# Patient Record
Sex: Female | Born: 1935 | Race: White | Hispanic: No | Smoking: Never smoker
Health system: Southern US, Community
[De-identification: ages and names within clinical notes are randomized; demographics above are authoritative.]

## PROBLEM LIST (undated history)

## (undated) DIAGNOSIS — I429 Cardiomyopathy, unspecified: Secondary | ICD-10-CM

## (undated) DIAGNOSIS — N183 Chronic kidney disease, stage 3 unspecified: Secondary | ICD-10-CM

## (undated) DIAGNOSIS — K449 Diaphragmatic hernia without obstruction or gangrene: Secondary | ICD-10-CM

## (undated) DIAGNOSIS — I77810 Thoracic aortic ectasia: Secondary | ICD-10-CM

## (undated) DIAGNOSIS — I509 Heart failure, unspecified: Secondary | ICD-10-CM

## (undated) DIAGNOSIS — J449 Chronic obstructive pulmonary disease, unspecified: Secondary | ICD-10-CM

## (undated) DIAGNOSIS — J9611 Chronic respiratory failure with hypoxia: Secondary | ICD-10-CM

## (undated) DIAGNOSIS — J45909 Unspecified asthma, uncomplicated: Secondary | ICD-10-CM

## (undated) DIAGNOSIS — M109 Gout, unspecified: Secondary | ICD-10-CM

## (undated) DIAGNOSIS — I4891 Unspecified atrial fibrillation: Secondary | ICD-10-CM

## (undated) DIAGNOSIS — Z9981 Dependence on supplemental oxygen: Secondary | ICD-10-CM

## (undated) DIAGNOSIS — E43 Unspecified severe protein-calorie malnutrition: Secondary | ICD-10-CM

## (undated) DIAGNOSIS — I10 Essential (primary) hypertension: Secondary | ICD-10-CM

## (undated) DIAGNOSIS — E039 Hypothyroidism, unspecified: Secondary | ICD-10-CM

## (undated) HISTORY — DX: Cardiomyopathy, unspecified: I42.9

## (undated) HISTORY — PX: ROTATOR CUFF REPAIR: SHX139

## (undated) HISTORY — DX: Unspecified asthma, uncomplicated: J45.909

## (undated) HISTORY — DX: Gout, unspecified: M10.9

## (undated) HISTORY — PX: ABDOMINAL HYSTERECTOMY: SUR658

---

## 1997-11-05 ENCOUNTER — Other Ambulatory Visit: Admission: RE | Admit: 1997-11-05 | Discharge: 1997-11-05 | Payer: Self-pay | Admitting: Obstetrics and Gynecology

## 1998-12-15 ENCOUNTER — Other Ambulatory Visit: Admission: RE | Admit: 1998-12-15 | Discharge: 1998-12-15 | Payer: Self-pay | Admitting: Obstetrics and Gynecology

## 1999-12-29 ENCOUNTER — Other Ambulatory Visit: Admission: RE | Admit: 1999-12-29 | Discharge: 1999-12-29 | Payer: Self-pay | Admitting: Obstetrics and Gynecology

## 2002-03-26 ENCOUNTER — Other Ambulatory Visit: Admission: RE | Admit: 2002-03-26 | Discharge: 2002-03-26 | Payer: Self-pay | Admitting: Obstetrics and Gynecology

## 2002-12-19 ENCOUNTER — Encounter: Payer: Self-pay | Admitting: Gastroenterology

## 2002-12-19 ENCOUNTER — Ambulatory Visit (HOSPITAL_COMMUNITY): Admission: RE | Admit: 2002-12-19 | Discharge: 2002-12-19 | Payer: Self-pay | Admitting: Gastroenterology

## 2004-01-21 ENCOUNTER — Encounter
Admission: RE | Admit: 2004-01-21 | Discharge: 2004-04-20 | Payer: Self-pay | Admitting: Physical Medicine and Rehabilitation

## 2004-05-04 ENCOUNTER — Encounter: Admission: RE | Admit: 2004-05-04 | Discharge: 2004-05-25 | Payer: Self-pay | Admitting: Family Medicine

## 2007-06-05 ENCOUNTER — Encounter: Admission: RE | Admit: 2007-06-05 | Discharge: 2007-06-05 | Payer: Self-pay | Admitting: Orthopedic Surgery

## 2007-06-06 ENCOUNTER — Ambulatory Visit (HOSPITAL_BASED_OUTPATIENT_CLINIC_OR_DEPARTMENT_OTHER): Admission: RE | Admit: 2007-06-06 | Discharge: 2007-06-07 | Payer: Self-pay | Admitting: Orthopedic Surgery

## 2007-12-30 ENCOUNTER — Encounter: Admission: RE | Admit: 2007-12-30 | Discharge: 2007-12-30 | Payer: Self-pay | Admitting: Family Medicine

## 2010-02-02 ENCOUNTER — Encounter: Admission: RE | Admit: 2010-02-02 | Discharge: 2010-02-02 | Payer: Self-pay | Admitting: Family Medicine

## 2010-09-01 NOTE — Op Note (Signed)
NAMEMENA, SIMONIS NO.:  [id number]   MEDICAL RECORD NO.:  [id number]          PATIENT TYPE:  AMB   LOCATION:  DSC                          FACILITY:  [facility]   PHYSICIAN:  Leonides Grills, M.D.     DATE OF BIRTH:  [birth year]   DATE OF PROCEDURE:  06/06/2007  DATE OF DISCHARGE:                               OPERATIVE REPORT   PREOPERATIVE DIAGNOSES:  1. Right hallux valgus.  2. Right benign deep soft tissue lesion, great toe.   POSTOPERATIVE DIAGNOSES:  1. Right hallux valgus.  2. Right benign deep soft tissue lesion, great toe.   PROCEDURE:  1. Right modified McBride bunionectomy.  2. Excision of benign deep soft tissue lesion foot.  3. Right great toe digital nerve neurolysis.   ANESTHESIA:  General with block.   SURGEON:  Leonides Grills, M.D.   ASSISTANT:  Richardean Canal, P.A.   ESTIMATED BLOOD LOSS:  Minimal.   TOURNIQUET TIME:  Approximately a 1/2 hour.   COMPLICATIONS:  None.   DISPOSITION:  Stable to PR.   INDICATIONS:  This is an 77 year old female who has had a chronic wound  over the medial aspect of her right great toe due to a large tophaceous  gouty lesion that was about 1/2 of a golf ball size  with a advanced  hallux valgus deformity.  She was consented for the above procedure.  All risks which included infection or vessel injury, persistent pain,  worsening pain, prolonged recovery, stiffness, arthritis, recurrence of  the tophaceous lesion and wound breakdown were all explained and  questions were encouraged and answered.   OPERATION:  The patient was brought to the operating room and placed in  supine position.  After adequate general endotracheal tube anesthesia  was administered, as well as Ancef 1 gram IV piggyback the right lower  extremity was then prepped and draped in sterile manner over proximally  thigh tourniquet.  The limb was gravity exsanguinated and tourniquet was  elevated to 290 mmHg.  A elliptical incision was made  around the small  wound.  This was excised the digital nerve dorsally was completely  dissected out a formal neurolysis was performed and neurorrhaphy.  Once  this retracted out of harm's way we were able to completely and  carefully dissect out the tophaceous lesion.  This was about a 1/2 of a  golf ball size and extended all way up to the EHL tendon dorsally and  down to the plantar third of the medial capsule and advanced to about  the proximal third of the proximal phalanx.  This enveloped the entire  medial capsule and made this quite insufficient.  This was carefully  dissected out.  Once this was dissected out the prominence of the medial  aspect of the great toe metatarsal head was then removed with a rongeur  and Alona Bene  ridge was rounded off as well.  Once this portion of  the modified McBride bunionectomy was performed we were able then to  release the lateral capsule with a curved Beaver blade protecting the  articular surface with a Engineer, site.  This released  the lateral capsule  nicely and we were able to reduce the sesamoids and reduce the joint.  We then dissected out what was left of the medial capsule and pieced  this together and I was able to reconstruct the medial capsule.  This  was not obviously as good as it would be if it was a native capsule, but  it was the best we could do with what we had left.  Once this was done  we were able to range the toe and the toe ranged beautifully and the  sesamoids were located.  Once again the joint areas were copiously  irrigated with normal saline prior to the reconstruction and after  reconstruction as well.  The tourniquet was deflated.  Hemostasis was  obtained.  Subcu was closed with 3-0 Vicryl and skin was closed with 4-0  nylon.  Sterile dressing applied.  A Roger Mann dressing was applied.  Hard sole shoe applied.  Patient stable to PR.      Leonides Grills, M.D.  Electronically Signed     PB/MEDQ  D:  06/06/2007  T:   06/07/2007  Job:  [id number]

## 2011-01-08 LAB — BASIC METABOLIC PANEL
BUN: 44 — ABNORMAL HIGH
Chloride: 104
GFR calc non Af Amer: 29 — ABNORMAL LOW
Glucose, Bld: 85
Potassium: 4.7
Sodium: 139

## 2011-01-08 LAB — POCT HEMOGLOBIN-HEMACUE: Hemoglobin: 12.2

## 2012-08-08 ENCOUNTER — Other Ambulatory Visit: Payer: Self-pay

## 2012-08-08 MED ORDER — SERTRALINE HCL 25 MG PO TABS: ORAL_TABLET | ORAL | Status: DC

## 2012-08-08 NOTE — Telephone Encounter (Signed)
xxx

## 2012-09-05 ENCOUNTER — Ambulatory Visit (INDEPENDENT_AMBULATORY_CARE_PROVIDER_SITE_OTHER): Payer: Medicare Other | Admitting: Family Medicine

## 2012-09-05 ENCOUNTER — Ambulatory Visit (INDEPENDENT_AMBULATORY_CARE_PROVIDER_SITE_OTHER): Payer: Medicare Other

## 2012-09-05 ENCOUNTER — Encounter: Payer: Self-pay | Admitting: Family Medicine

## 2012-09-05 VITALS — BP 129/72 | HR 91 | Temp 96.9°F | Ht 62.25 in | Wt 177.2 lb

## 2012-09-05 DIAGNOSIS — R0602 Shortness of breath: Secondary | ICD-10-CM

## 2012-09-05 DIAGNOSIS — R002 Palpitations: Secondary | ICD-10-CM

## 2012-09-05 DIAGNOSIS — R0789 Other chest pain: Secondary | ICD-10-CM

## 2012-09-05 LAB — BASIC METABOLIC PANEL WITH GFR
BUN: 33 mg/dL — ABNORMAL HIGH (ref 6–23)
CO2: 26 mEq/L (ref 19–32)
Calcium: 10.2 mg/dL (ref 8.4–10.5)
Chloride: 101 mEq/L (ref 96–112)
Creat: 1.87 mg/dL — ABNORMAL HIGH (ref 0.50–1.10)

## 2012-09-05 LAB — POCT CBC
Hemoglobin: 13.6 g/dL (ref 12.2–16.2)
Lymph, poc: 3.2 (ref 0.6–3.4)
MCH, POC: 33.3 pg — AB (ref 27–31.2)
MCHC: 34.6 g/dL (ref 31.8–35.4)
MPV: 7.4 fL (ref 0–99.8)
POC Granulocyte: 6.7 (ref 2–6.9)
POC LYMPH PERCENT: 31.2 %L (ref 10–50)
Platelet Count, POC: 257 10*3/uL (ref 142–424)
RBC: 4.1 M/uL (ref 4.04–5.48)

## 2012-09-05 LAB — THYROID PANEL WITH TSH: T4, Total: 10.1 ug/dL (ref 5.0–12.5)

## 2012-09-05 NOTE — Patient Instructions (Addendum)
Continue medications as doing We will arrange an appointment with a cardiologist here in [place] When labs are returned we may adjust the medication you're currently taking

## 2012-09-05 NOTE — Progress Notes (Signed)
  Subjective:    Patient ID: Daisy Scott, female    DOB: 06-Oct-1918, 79 y.o.   MRN: [id number]  HPI  Heart is pounding with minimal activity around the house. Sometimes she says it actually hurts according to her daughter. There is also some shortness of breath associated with this pounding. She may be coughing more with the season of the year. We will update her health maintenance issues at her regularly scheduled appointment.   Review of Systems  Respiratory: Positive for shortness of breath (with elevated  heart rate).   Cardiovascular:       Tachycardia with exertion       Objective:   Physical Exam Patient is alert and cooperative.  Neck was supple without thyroid or adenopathy. Heart was slightly irregular at 60 per minute. No murmurs apparent. Lungs were clear anteriorly and posteriorly. No wheezing, rales or rhonchi. Abdomen was obese without tenderness organomegaly or masses. Extremities were not edematous. She had fairly good range of motion but there was stiffness with movement.   EKG: Within normal limits, no acute changes  [facility] reading (PRIMARY) by  Dr. Christell Constant: Chest x-ray showed bronchitic changes and kyphosis                                     Assessment & Plan:  1. Chest tightness - EKG 12-Lead - DG Chest 2 View; Future - POCT CBC - Ambulatory referral to Cardiology  2. Palpitations - EKG 12-Lead - POCT CBC - Thyroid Panel With TSH -Theophylline level - Ambulatory referral to Cardiology  3. Shortness of breath - EKG 12-Lead - DG Chest 2 View; Future - POCT CBC - BASIC METABOLIC PANEL WITH GFR - Ambulatory referral to Cardiology  4. Hypothyroidism  5. Asthma stable  6. Osteoarthritis stable -Fall prevention  Patient Instructions  Continue medications as doing We will arrange an appointment with a cardiologist here in [place] When labs are returned we may adjust the medication you're currently taking

## 2012-09-07 ENCOUNTER — Telehealth: Payer: Self-pay | Admitting: *Deleted

## 2012-09-07 NOTE — Telephone Encounter (Signed)
Pt and pt's daughter notified of result

## 2012-09-07 NOTE — Telephone Encounter (Signed)
Message copied by Bearl Mulberry on Thu Sep 07, 2012  9:23 AM ------      Message from: Ernestina Penna      Created: Tue Sep 05, 2012  5:00 PM       Call Ms. Devereux daughter      CBC had a slightly elevated white count but fine no problem      Hemoglobin was good at 13.6 and platelet count was adequate ------

## 2012-09-19 ENCOUNTER — Other Ambulatory Visit: Payer: Self-pay

## 2012-09-19 ENCOUNTER — Telehealth: Payer: Self-pay | Admitting: Family Medicine

## 2012-09-19 MED ORDER — LISINOPRIL-HYDROCHLOROTHIAZIDE 20-12.5 MG PO TABS: 1.0000 | ORAL_TABLET | Freq: Every day | ORAL | Status: DC

## 2012-09-26 NOTE — Telephone Encounter (Signed)
Error

## 2012-09-27 ENCOUNTER — Institutional Professional Consult (permissible substitution): Payer: Self-pay | Admitting: Cardiology

## 2012-09-28 ENCOUNTER — Encounter: Payer: Self-pay | Admitting: *Deleted

## 2012-10-10 ENCOUNTER — Other Ambulatory Visit: Payer: Self-pay | Admitting: *Deleted

## 2012-10-10 MED ORDER — ALLOPURINOL 100 MG PO TABS: 200.0000 mg | ORAL_TABLET | Freq: Every day | ORAL | Status: DC

## 2012-10-10 MED ORDER — LORAZEPAM 0.5 MG PO TABS: 0.5000 mg | ORAL_TABLET | Freq: Every day | ORAL | Status: DC

## 2012-10-10 NOTE — Telephone Encounter (Signed)
Last seen 09/07/12, last filled 07/08/12. Please have nurse call into [facility]

## 2012-10-18 ENCOUNTER — Other Ambulatory Visit: Payer: Self-pay | Admitting: *Deleted

## 2012-10-18 MED ORDER — PANTOPRAZOLE SODIUM 40 MG PO TBEC: 40.0000 mg | DELAYED_RELEASE_TABLET | Freq: Every day | ORAL | Status: DC

## 2012-10-23 ENCOUNTER — Ambulatory Visit: Payer: Self-pay | Admitting: Family Medicine

## 2012-10-31 ENCOUNTER — Ambulatory Visit (INDEPENDENT_AMBULATORY_CARE_PROVIDER_SITE_OTHER): Payer: Medicare Other | Admitting: Family Medicine

## 2012-10-31 ENCOUNTER — Encounter: Payer: Self-pay | Admitting: Family Medicine

## 2012-10-31 VITALS — BP 128/73 | HR 76 | Temp 97.0°F | Ht 62.25 in | Wt 178.0 lb

## 2012-10-31 DIAGNOSIS — E039 Hypothyroidism, unspecified: Secondary | ICD-10-CM | POA: Insufficient documentation

## 2012-10-31 DIAGNOSIS — N189 Chronic kidney disease, unspecified: Secondary | ICD-10-CM

## 2012-10-31 DIAGNOSIS — I10 Essential (primary) hypertension: Secondary | ICD-10-CM | POA: Insufficient documentation

## 2012-10-31 DIAGNOSIS — E559 Vitamin D deficiency, unspecified: Secondary | ICD-10-CM

## 2012-10-31 DIAGNOSIS — E785 Hyperlipidemia, unspecified: Secondary | ICD-10-CM

## 2012-10-31 DIAGNOSIS — Z8709 Personal history of other diseases of the respiratory system: Secondary | ICD-10-CM | POA: Insufficient documentation

## 2012-10-31 LAB — POCT CBC
Granulocyte percent: 56.1 %G (ref 37–80)
HCT, POC: 37.6 % — AB (ref 37.7–47.9)
Hemoglobin: 13.2 g/dL (ref 12.2–16.2)
MPV: 8.2 fL (ref 0–99.8)
POC Granulocyte: 4.5 (ref 2–6.9)
POC LYMPH PERCENT: 34.9 %L (ref 10–50)
RBC: 3.9 M/uL — AB (ref 4.04–5.48)

## 2012-10-31 MED ORDER — ALLOPURINOL 100 MG PO TABS: 200.0000 mg | ORAL_TABLET | Freq: Every day | ORAL | Status: DC

## 2012-10-31 MED ORDER — THEOPHYLLINE ER 300 MG PO TB12: 150.0000 mg | ORAL_TABLET | Freq: Two times a day (BID) | ORAL | Status: DC

## 2012-10-31 MED ORDER — LORAZEPAM 0.5 MG PO TABS: 0.5000 mg | ORAL_TABLET | Freq: Two times a day (BID) | ORAL | Status: DC

## 2012-10-31 MED ORDER — PANTOPRAZOLE SODIUM 40 MG PO TBEC: 40.0000 mg | DELAYED_RELEASE_TABLET | Freq: Every day | ORAL | Status: DC

## 2012-10-31 MED ORDER — GABAPENTIN 100 MG PO CAPS: 100.0000 mg | ORAL_CAPSULE | Freq: Every day | ORAL | Status: DC

## 2012-10-31 NOTE — Patient Instructions (Addendum)
Fall precautions discussed continue current meds and therapeutic lifestyle changes

## 2012-10-31 NOTE — Progress Notes (Signed)
  Subjective:    Patient ID: Daisy Scott, female    DOB: 01-Apr-1919, 80 y.o.   MRN: [id number]  HPI Patient returns to clinic today for followup of chronic medical problems. These include hypertension, hyperlipidemia, hypothyroidism, chronic kidney disease, and GERD. She also has a history of asthma which has been stable. Her appetite is good and there no specific complaints from the sitter or the family. The sitter accompanies her today . She did not get on the table for an exam because of her back and knees. She does have her walker with her today.   Review of Systems  Constitutional: Positive for fatigue (some).  Respiratory: Positive for cough (slight in the AM). Negative for shortness of breath and wheezing.   Cardiovascular: Negative.   Gastrointestinal: Negative.   Endocrine: Negative.   Genitourinary: Negative.   Musculoskeletal: Positive for arthralgias (L shoulder, bilateral knees). Negative for back pain.  Skin: Negative.   Allergic/Immunologic: Negative.   Neurological: Negative.   Psychiatric/Behavioral: Negative.        Objective:   Physical Exam BP 128/73  Pulse 76  Temp(Src) 97 F (36.1 C) (Oral)  Ht 5' 2.25" (1.581 m)  Wt 178 lb (80.74 kg)  BMI 32.3 kg/m2  The patient appeared well nourished and normally developed for age, alert and oriented to time and place. Speech, behavior and judgement appear normal. Vital signs as documented.  Head exam is unremarkable. No scleral icterus or pallor noted. Ears nose and throat were normal. Neck is without jugular venous distension, thyromegally, or carotid bruits. Carotid upstrokes are brisk bilaterally. No cervical adenopathy. Lungs are clear anteriorly and posteriorly to auscultation. No wheezing. Normal respiratory effort. Cardiac exam reveals regular rate and rhythm at 84 per minute. First and second heart sounds normal.  No murmurs, rubs or gallops.  Abdominal exam reveals obesity, normal bowl sounds, no  masses, no organomegaly and no aortic enlargement. Extremities are nonedematous. Skin without pallor or jaundice.  Warm and dry, without rash. Neurologic exam reveals normal deep tendon reflexes and normal sensation.          Assessment & Plan:  1. Hypertension - POCT CBC  2. Hyperlipemia - Hepatic function panel - Lipid panel  3. Hypothyroid  4. CKD (chronic kidney disease), unspecified stage - BASIC METABOLIC PANEL WITH GFR  5. Vitamin D deficiency - Vitamin D 25 hydroxy Patient Instructions  Fall precautions discussed continue current meds and therapeutic lifestyle changes

## 2012-11-01 ENCOUNTER — Telehealth: Payer: Self-pay | Admitting: *Deleted

## 2012-11-01 LAB — HEPATIC FUNCTION PANEL
AST: 22 U/L (ref 0–37)
Alkaline Phosphatase: 48 U/L (ref 39–117)
Bilirubin, Direct: 0.1 mg/dL (ref 0.0–0.3)
Indirect Bilirubin: 0.2 mg/dL (ref 0.0–0.9)
Total Bilirubin: 0.3 mg/dL (ref 0.3–1.2)

## 2012-11-01 LAB — BASIC METABOLIC PANEL WITH GFR
CO2: 28 mEq/L (ref 19–32)
Calcium: 10.1 mg/dL (ref 8.4–10.5)
Chloride: 103 mEq/L (ref 96–112)
Potassium: 4.2 mEq/L (ref 3.5–5.3)
Sodium: 141 mEq/L (ref 135–145)

## 2012-11-01 LAB — LIPID PANEL
HDL: 84 mg/dL (ref >39)
Total CHOL/HDL Ratio: 2.9 Ratio
VLDL: 23 mg/dL (ref 0–40)

## 2012-11-01 LAB — VITAMIN D 25 HYDROXY (VIT D DEFICIENCY, FRACTURES): Vit D, 25-Hydroxy: 71 ng/mL (ref 30–89)

## 2012-11-01 NOTE — Telephone Encounter (Signed)
Message copied by Bearl Mulberry on Wed Nov 01, 2012  5:45 PM ------      Message from: Ernestina Penna      Created: Tue Oct 31, 2012  9:08 PM       Normal CBC including hemoglobin WBC and platelets            Call Delbert Phenix, her daughter ------

## 2012-11-01 NOTE — Telephone Encounter (Signed)
Pt notified of results

## 2012-11-02 ENCOUNTER — Other Ambulatory Visit: Payer: Self-pay

## 2012-11-02 MED ORDER — SERTRALINE HCL 50 MG PO TABS: 50.0000 mg | ORAL_TABLET | Freq: Every day | ORAL | Status: DC

## 2012-11-13 ENCOUNTER — Ambulatory Visit (INDEPENDENT_AMBULATORY_CARE_PROVIDER_SITE_OTHER): Payer: Medicare Other

## 2012-11-13 ENCOUNTER — Ambulatory Visit (INDEPENDENT_AMBULATORY_CARE_PROVIDER_SITE_OTHER): Payer: Medicare Other | Admitting: Family Medicine

## 2012-11-13 ENCOUNTER — Telehealth: Payer: Self-pay | Admitting: Family Medicine

## 2012-11-13 ENCOUNTER — Encounter: Payer: Self-pay | Admitting: Family Medicine

## 2012-11-13 VITALS — BP 131/77 | HR 84 | Temp 97.4°F | Ht 62.25 in | Wt 177.6 lb

## 2012-11-13 DIAGNOSIS — R0602 Shortness of breath: Secondary | ICD-10-CM

## 2012-11-13 DIAGNOSIS — R05 Cough: Secondary | ICD-10-CM

## 2012-11-13 LAB — POCT CBC
HCT, POC: 38.8 % (ref 37.7–47.9)
MCHC: 33.4 g/dL (ref 31.8–35.4)
MCV: 95.3 fL (ref 80–97)
POC Granulocyte: 5.5 (ref 2–6.9)
RDW, POC: 15.8 %
WBC: 9.2 10*3/uL (ref 4.6–10.2)

## 2012-11-13 MED ORDER — PREDNISONE 10 MG PO TABS: 10.0000 mg | ORAL_TABLET | Freq: Every day | ORAL | Status: DC

## 2012-11-13 MED ORDER — METHYLPREDNISOLONE ACETATE 80 MG/ML IJ SUSP
60.0000 mg | Freq: Once | INTRAMUSCULAR | Status: AC
  Administered 2012-11-13: 60 mg via INTRAMUSCULAR

## 2012-11-13 NOTE — Progress Notes (Signed)
Subjective:    Patient ID: Daisy Scott, female    DOB: 05-03-1918, 79 y.o.   MRN: [id number]  HPI Patient has developed increased congestion, wheezing and cough. This has been going on for about 4 days. She apparently has not been running any fever with this. Patient comes with your caregiver and daughter.  Review of Systems  Constitutional: Positive for fatigue. Negative for fever.  HENT: Positive for congestion and postnasal drip. Negative for ear pain.   Eyes: Negative.   Respiratory: Positive for cough (prod) and shortness of breath.   Cardiovascular: Positive for leg swelling. Negative for chest pain and palpitations.  Gastrointestinal: Negative.   Genitourinary: Negative.   Musculoskeletal: Negative.   Allergic/Immunologic: Negative.   Neurological: Negative for dizziness and headaches.  Hematological: Negative.   Psychiatric/Behavioral: Negative.        Objective:   Physical Exam  Vitals reviewed. Constitutional: She is oriented to person, place, and time. She appears well-developed and well-nourished. No distress.  HENT:  Head: Normocephalic and atraumatic.  Right Ear: External ear normal.  Left Ear: External ear normal.  Nose: Nose normal.  Mouth/Throat: Oropharynx is clear and moist. No oropharyngeal exudate.  Eyes: Conjunctivae are normal. Right eye exhibits no discharge. Left eye exhibits no discharge. Scleral icterus is present.  Neck: Normal range of motion. Neck supple. No thyromegaly present.  Cardiovascular: Normal rate, regular rhythm and normal heart sounds.  Exam reveals no gallop and no friction rub.   No murmur heard. Pulmonary/Chest: Effort normal. No stridor. No respiratory distress. She has wheezes. She has no rales.  Also a few rhonchi with expiration  Musculoskeletal: She exhibits no edema.  Patient in wheelchair  Lymphadenopathy:    She has no cervical adenopathy.  Neurological: She is alert and oriented to person, place, and time.   Skin: Skin is warm and dry. No rash noted. She is not diaphoretic. No erythema. No pallor.  Psychiatric: She has a normal mood and affect. Her behavior is normal. Judgment and thought content normal.    Results for orders placed in visit on 11/13/12  POCT CBC      Result Value Range   WBC 9.2  4.6 - 10.2 K/uL   Lymph, poc 2.7  0.6 - 3.4   POC LYMPH PERCENT 29.1  10 - 50 %L   POC Granulocyte 5.5  2 - 6.9   Granulocyte percent 59.8  37 - 80 %G   RBC 4.1  4.04 - 5.48 M/uL   Hemoglobin 13.0  12.2 - 16.2 g/dL   HCT, POC 40.9  81.1 - 47.9 %   MCV 95.3  80 - 97 fL   MCH, POC 31.8 (*) 27 - 31.2 pg   MCHC 33.4  31.8 - 35.4 g/dL   RDW, POC 91.4     Platelet Count, POC 262.0  142 - 424 K/uL   MPV 7.4  0 - 99.8 fL   [facility] reading (PRIMARY) by  Dr. Christell Constant: Chest x-ray; kyphotic spine otherwise within normal limits                                 Patient and daughter and caregiver are aware of results before they left office.       Assessment & Plan:  1. Cough - POCT CBC - DG Chest 2 View; Future  2. SOB (shortness of breath) - POCT CBC - DG Chest 2 View; Future  3. Prescription was called in were as follows; -Z-Pak -Prednisone 10 taper #20 -Mucinex maximum strength one twice daily with a large glass of water - albuterol inhaler as a rescue inhaler -60 of Depo-Medrol given IM in the office  Patient Instructions  Drink plenty of fluids Take Tylenol if needed for aches pains and fever Take medications as directed Use rescue inhaler only if needed for increased wheezing   Nyra Capes MD

## 2012-11-13 NOTE — Telephone Encounter (Signed)
DAUGHTER SPOKE WITH KAY R. AND TOLD TO COME IN TO SEE DWM

## 2012-11-13 NOTE — Patient Instructions (Signed)
Drink plenty of fluids Take Tylenol if needed for aches pains and fever Take medications as directed Use rescue inhaler only if needed for increased wheezing

## 2012-11-16 ENCOUNTER — Other Ambulatory Visit: Payer: Self-pay | Admitting: *Deleted

## 2012-11-16 MED ORDER — ALBUTEROL SULFATE HFA 108 (90 BASE) MCG/ACT IN AERS: 2.0000 | INHALATION_SPRAY | Freq: Four times a day (QID) | RESPIRATORY_TRACT | Status: DC | PRN

## 2012-11-16 NOTE — Telephone Encounter (Signed)
Received rx refill request from pharmacy for Ventolin but not on patients med list. Please advise

## 2013-01-25 ENCOUNTER — Ambulatory Visit (INDEPENDENT_AMBULATORY_CARE_PROVIDER_SITE_OTHER): Payer: Medicare Other

## 2013-01-25 ENCOUNTER — Encounter (INDEPENDENT_AMBULATORY_CARE_PROVIDER_SITE_OTHER): Payer: Self-pay

## 2013-01-25 DIAGNOSIS — Z23 Encounter for immunization: Secondary | ICD-10-CM

## 2013-02-05 ENCOUNTER — Other Ambulatory Visit: Payer: Self-pay

## 2013-02-05 MED ORDER — SERTRALINE HCL 50 MG PO TABS: ORAL_TABLET | ORAL | Status: DC

## 2013-02-05 NOTE — Telephone Encounter (Signed)
Last seen 11/13/12  DWM

## 2013-02-07 ENCOUNTER — Other Ambulatory Visit: Payer: Self-pay

## 2013-02-07 MED ORDER — AMLODIPINE BESYLATE 5 MG PO TABS: 5.0000 mg | ORAL_TABLET | Freq: Every day | ORAL | Status: DC

## 2013-03-09 ENCOUNTER — Other Ambulatory Visit: Payer: Self-pay | Admitting: *Deleted

## 2013-03-09 MED ORDER — LEVOTHYROXINE SODIUM 75 MCG PO TABS: 75.0000 ug | ORAL_TABLET | Freq: Every day | ORAL | Status: DC

## 2013-03-09 MED ORDER — SERTRALINE HCL 50 MG PO TABS: ORAL_TABLET | ORAL | Status: DC

## 2013-03-13 ENCOUNTER — Other Ambulatory Visit: Payer: Self-pay

## 2013-03-13 MED ORDER — LISINOPRIL-HYDROCHLOROTHIAZIDE 20-12.5 MG PO TABS: 1.0000 | ORAL_TABLET | Freq: Every day | ORAL | Status: DC

## 2013-03-13 MED ORDER — SALMETEROL XINAFOATE 50 MCG/DOSE IN AEPB: 1.0000 | INHALATION_SPRAY | Freq: Two times a day (BID) | RESPIRATORY_TRACT | Status: DC

## 2013-04-05 ENCOUNTER — Telehealth: Payer: Self-pay | Admitting: Family Medicine

## 2013-04-09 ENCOUNTER — Ambulatory Visit (INDEPENDENT_AMBULATORY_CARE_PROVIDER_SITE_OTHER): Payer: Medicare Other | Admitting: Family Medicine

## 2013-04-09 ENCOUNTER — Encounter: Payer: Self-pay | Admitting: Family Medicine

## 2013-04-09 VITALS — BP 139/76 | HR 80 | Temp 96.5°F | Ht 62.25 in | Wt 184.0 lb

## 2013-04-09 DIAGNOSIS — Z20828 Contact with and (suspected) exposure to other viral communicable diseases: Secondary | ICD-10-CM

## 2013-04-09 DIAGNOSIS — Z8639 Personal history of other endocrine, nutritional and metabolic disease: Secondary | ICD-10-CM

## 2013-04-09 DIAGNOSIS — Z862 Personal history of diseases of the blood and blood-forming organs and certain disorders involving the immune mechanism: Secondary | ICD-10-CM

## 2013-04-09 DIAGNOSIS — I10 Essential (primary) hypertension: Secondary | ICD-10-CM

## 2013-04-09 DIAGNOSIS — Z8739 Personal history of other diseases of the musculoskeletal system and connective tissue: Secondary | ICD-10-CM

## 2013-04-09 DIAGNOSIS — E785 Hyperlipidemia, unspecified: Secondary | ICD-10-CM

## 2013-04-09 DIAGNOSIS — N189 Chronic kidney disease, unspecified: Secondary | ICD-10-CM

## 2013-04-09 DIAGNOSIS — E039 Hypothyroidism, unspecified: Secondary | ICD-10-CM

## 2013-04-09 DIAGNOSIS — J45909 Unspecified asthma, uncomplicated: Secondary | ICD-10-CM | POA: Insufficient documentation

## 2013-04-09 DIAGNOSIS — E559 Vitamin D deficiency, unspecified: Secondary | ICD-10-CM

## 2013-04-09 MED ORDER — NYSTATIN 100000 UNIT/GM EX CREA: 1.0000 "application " | TOPICAL_CREAM | Freq: Two times a day (BID) | CUTANEOUS | Status: DC

## 2013-04-09 MED ORDER — OSELTAMIVIR PHOSPHATE 75 MG PO CAPS: 75.0000 mg | ORAL_CAPSULE | Freq: Every day | ORAL | Status: DC

## 2013-04-09 NOTE — Progress Notes (Signed)
Subjective:    Patient ID: Daisy Scott, female    DOB: Nov 07, 1918, 79 y.o.   MRN: [id number]  HPI Pt here for follow up and management of chronic medical problems. Patient comes today with her caregiver. She complains of an irritated area on her back. She also complains of a questionable nodule below her right mandible in the neck. Incidentally she does have that done her daughter that stay with her this weekend has developed the flu. She is doing well and all otherwise and today is not sick at all appear      Patient Active Problem List   Diagnosis Date Noted  . Hypertension 10/31/2012  . Hyperlipemia 10/31/2012  . Hypothyroid 10/31/2012  . CKD (chronic kidney disease) 10/31/2012  . Vitamin D deficiency 10/31/2012  . History of asthma 10/31/2012   Outpatient Encounter Prescriptions as of 04/09/2013  Medication Sig  . albuterol (PROVENTIL HFA;VENTOLIN HFA) 108 (90 BASE) MCG/ACT inhaler Inhale 2 puffs into the lungs every 6 (six) hours as needed for wheezing.  Marland Kitchen allopurinol (ZYLOPRIM) 100 MG tablet Take 2 tablets (200 mg total) by mouth daily.  Marland Kitchen amLODipine (NORVASC) 5 MG tablet Take 5 mg by mouth daily. As directed  . aspirin EC 81 MG tablet Take 81 mg by mouth daily.  . brinzolamide (AZOPT) 1 % ophthalmic suspension Place 1 drop into the right eye 2 (two) times daily.  . Calcium Carbonate-Vitamin D (CALCIUM-D) 600-400 MG-UNIT TABS Take 1 tablet by mouth 2 (two) times daily.  . Cranberry 500 MG CAPS Take 1 capsule by mouth daily.  . fish oil-omega-3 fatty acids 1000 MG capsule Take 2 g by mouth 2 (two) times daily.  Marland Kitchen gabapentin (NEURONTIN) 100 MG capsule Take 1 capsule (100 mg total) by mouth at bedtime.  Marland Kitchen levothyroxine (SYNTHROID, LEVOTHROID) 75 MCG tablet Take 1 tablet (75 mcg total) by mouth daily before breakfast.  . lisinopril-hydrochlorothiazide (PRINZIDE,ZESTORETIC) 20-12.5 MG per tablet Take 1 tablet by mouth 2 (two) times daily.  Marland Kitchen LORazepam (ATIVAN) 0.5 MG  tablet Take 1 tablet (0.5 mg total) by mouth 2 (two) times daily. As directed  . Multiple Vitamin (MULTIVITAMIN WITH MINERALS) TABS tablet Take 1 tablet by mouth daily.  . pantoprazole (PROTONIX) 40 MG tablet Take 1 tablet (40 mg total) by mouth daily.  . salmeterol (SEREVENT) 50 MCG/DOSE diskus inhaler Inhale 1 puff into the lungs 2 (two) times daily.  . sertraline (ZOLOFT) 50 MG tablet 1/2 tablet daily  . theophylline (THEODUR) 300 MG 12 hr tablet Take 0.5 tablets (150 mg total) by mouth 2 (two) times daily. 1/2 tablet BID  . travoprost, benzalkonium, (TRAVATAN) 0.004 % ophthalmic solution Place 1 drop into the right eye at bedtime.  . [DISCONTINUED] amLODipine (NORVASC) 5 MG tablet Take 1 tablet (5 mg total) by mouth daily.  . [DISCONTINUED] lisinopril-hydrochlorothiazide (PRINZIDE,ZESTORETIC) 20-12.5 MG per tablet Take 1 tablet by mouth daily.  . [DISCONTINUED] predniSONE (DELTASONE) 10 MG tablet Take 1 tablet (10 mg total) by mouth daily. 1 tablet qid x 2 days 1 tablet tid x 2 days 1 tablet bid x 2 days 1 tablet qd x 2 days    Review of Systems  Constitutional: Negative.   HENT: Negative.        Knot on right side of neck/chin  Eyes: Negative.   Respiratory: Negative.   Cardiovascular: Negative.   Gastrointestinal: Negative.   Endocrine: Negative.   Genitourinary: Negative.   Musculoskeletal: Negative.   Skin: Negative.  Check place on back / itches  Allergic/Immunologic: Negative.   Neurological: Negative.   Hematological: Negative.   Psychiatric/Behavioral: Negative.        Objective:   Physical Exam  Nursing note and vitals reviewed. Constitutional: She is oriented to person, place, and time. She appears well-developed and well-nourished.  Pleasant smiling and cooperative with no specific complaints  HENT:  Head: Normocephalic and atraumatic.  Right Ear: External ear normal.  Left Ear: External ear normal.  Nose: Nose normal.  Mouth/Throat: Oropharynx is  clear and moist.  Eyes: Conjunctivae and EOM are normal. Pupils are equal, round, and reactive to light. Right eye exhibits no discharge. Left eye exhibits no discharge. No scleral icterus.  Neck: Normal range of motion. Neck supple. No JVD present. No thyromegaly present.  Could not really appreciate any lump or abnormality below the angle of the mandible on the right  Cardiovascular: Normal rate, regular rhythm, normal heart sounds and intact distal pulses.  Exam reveals no gallop and no friction rub.   No murmur heard. At 72 per minute.  Pulmonary/Chest: Effort normal and breath sounds normal. No respiratory distress. She has no wheezes. She has no rales. She exhibits no tenderness.  Specifically no wheezing present  Abdominal: Soft. Bowel sounds are normal. She exhibits no mass. There is no tenderness. There is no rebound and no guarding.  Obesity is present.  Musculoskeletal: Normal range of motion. She exhibits edema. She exhibits no tenderness.  Slight fullness and 1+ pretibial edema on the right lower extremity. This is extremity that she had the phlebitis in the past.  Lymphadenopathy:    She has no cervical adenopathy.  Neurological: She is alert and oriented to person, place, and time. She has normal reflexes. No cranial nerve deficit.  Skin: Skin is warm and dry.  Patient has very dry skin on her back in the area she is complaining of itching with it is a seborrheic keratosis.  Psychiatric: She has a normal mood and affect. Her behavior is normal. Judgment and thought content normal.   BP 139/76  Pulse 80  Temp(Src) 96.5 F (35.8 C) (Oral)  Ht 5' 2.25" (1.581 m)  Wt 184 lb (83.462 kg)  BMI 33.39 kg/m2        Assessment & Plan:  1. CKD (chronic kidney disease) - POCT CBC; Future  2. Hyperlipemia - POCT CBC; Future - NMR, lipoprofile; Future  3. Hypertension - POCT CBC; Future - BMP8+EGFR; Future - Hepatic function panel; Future  4. Hypothyroid - POCT CBC;  Future  5. Vitamin D deficiency - POCT CBC; Future - Vit D  25 hydroxy (rtn osteoporosis monitoring); Future  6. Asthma, chronic  7. History of gout  8. Exposure to the flu - oseltamivir (TAMIFLU) 75 MG capsule; Take 1 capsule (75 mg total) by mouth daily.  Dispense: 10 capsule; Refill: 0   Orders Placed This Encounter  Procedures  . BMP8+EGFR    Standing Status: Future     Number of Occurrences:      Standing Expiration Date: 04/09/2014  . Hepatic function panel    Standing Status: Future     Number of Occurrences:      Standing Expiration Date: 04/09/2014  . NMR, lipoprofile    Standing Status: Future     Number of Occurrences:      Standing Expiration Date: 04/09/2014  . Vit D  25 hydroxy (rtn osteoporosis monitoring)    Standing Status: Future     Number of Occurrences:  Standing Expiration Date: 04/09/2014  . POCT CBC    Standing Status: Future     Number of Occurrences:      Standing Expiration Date: 05/10/2013   Meds ordered this encounter  Medications  . Multiple Vitamin (MULTIVITAMIN WITH MINERALS) TABS tablet    Sig: Take 1 tablet by mouth daily.  . Cranberry 500 MG CAPS    Sig: Take 1 capsule by mouth daily.  Marland Kitchen amLODipine (NORVASC) 5 MG tablet    Sig: Take 5 mg by mouth daily. As directed  . lisinopril-hydrochlorothiazide (PRINZIDE,ZESTORETIC) 20-12.5 MG per tablet    Sig: Take 1 tablet by mouth 2 (two) times daily.  Marland Kitchen nystatin cream (MYCOSTATIN)    Sig: Apply 1 application topically 2 (two) times daily.    Dispense:  30 g    Refill:  1  . oseltamivir (TAMIFLU) 75 MG capsule    Sig: Take 1 capsule (75 mg total) by mouth daily.    Dispense:  10 capsule    Refill:  0   Patient Instructions  Continue current medications. Continue good therapeutic lifestyle changes which include good diet and exercise. Fall precautions discussed with patient. Schedule your flu vaccine if you haven't had it yet If you are over 66 years old - you may need Prevnar 13  or the adult Pneumonia vaccine. Because of your recent exposure to your family member that had the flu we will call in the medicine to help prevent the flu and you Drink plenty of fluids treat cold or flu symptoms with Tylenol and over-the-counter cough medicine like Mucinex  Return to clinic in a couple of weeks and we will give you the Prevnar vaccine and get lab work    Nyra Capes MD

## 2013-04-09 NOTE — Patient Instructions (Addendum)
Continue current medications. Continue good therapeutic lifestyle changes which include good diet and exercise. Fall precautions discussed with patient. Schedule your flu vaccine if you haven't had it yet If you are over 77 years old - you may need Prevnar 13 or the adult Pneumonia vaccine. Because of your recent exposure to your family member that had the flu we will call in the medicine to help prevent the flu and you Drink plenty of fluids treat cold or flu symptoms with Tylenol and over-the-counter cough medicine like Mucinex  Return to clinic in a couple of weeks and we will give you the Prevnar vaccine and get lab work

## 2013-04-11 ENCOUNTER — Telehealth: Payer: Self-pay | Admitting: Family Medicine

## 2013-04-11 NOTE — Telephone Encounter (Signed)
Pt's daughter notified of DWMs recommendations Verbalizes understanding

## 2013-04-11 NOTE — Telephone Encounter (Signed)
Use Mucinex maximum strength, plain, blue and white in color, over-the-counter one twice daily with a large glass of water for cough and congestion. Also take Tylenol for aches pains and fever. Drink plenty of fluid. Take Tamiflu

## 2013-05-03 ENCOUNTER — Emergency Department (HOSPITAL_COMMUNITY)
Admission: EM | Admit: 2013-05-03 | Discharge: 2013-05-03 | Disposition: A | Discharge status: Home / Self Care | Payer: Medicare Other | Attending: Emergency Medicine | Admitting: Emergency Medicine

## 2013-05-03 ENCOUNTER — Emergency Department (HOSPITAL_COMMUNITY): Payer: Medicare Other

## 2013-05-03 ENCOUNTER — Encounter (HOSPITAL_COMMUNITY): Payer: Self-pay | Admitting: Emergency Medicine

## 2013-05-03 DIAGNOSIS — S8990XA Unspecified injury of unspecified lower leg, initial encounter: Secondary | ICD-10-CM | POA: Insufficient documentation

## 2013-05-03 DIAGNOSIS — Z7982 Long term (current) use of aspirin: Secondary | ICD-10-CM | POA: Insufficient documentation

## 2013-05-03 DIAGNOSIS — S39012A Strain of muscle, fascia and tendon of lower back, initial encounter: Secondary | ICD-10-CM

## 2013-05-03 DIAGNOSIS — Y939 Activity, unspecified: Secondary | ICD-10-CM | POA: Insufficient documentation

## 2013-05-03 DIAGNOSIS — S76011A Strain of muscle, fascia and tendon of right hip, initial encounter: Secondary | ICD-10-CM

## 2013-05-03 DIAGNOSIS — S99929A Unspecified injury of unspecified foot, initial encounter: Secondary | ICD-10-CM

## 2013-05-03 DIAGNOSIS — M109 Gout, unspecified: Secondary | ICD-10-CM | POA: Insufficient documentation

## 2013-05-03 DIAGNOSIS — S0990XA Unspecified injury of head, initial encounter: Secondary | ICD-10-CM | POA: Insufficient documentation

## 2013-05-03 DIAGNOSIS — M25562 Pain in left knee: Secondary | ICD-10-CM

## 2013-05-03 DIAGNOSIS — R296 Repeated falls: Secondary | ICD-10-CM | POA: Insufficient documentation

## 2013-05-03 DIAGNOSIS — W19XXXA Unspecified fall, initial encounter: Secondary | ICD-10-CM

## 2013-05-03 DIAGNOSIS — I129 Hypertensive chronic kidney disease with stage 1 through stage 4 chronic kidney disease, or unspecified chronic kidney disease: Secondary | ICD-10-CM | POA: Insufficient documentation

## 2013-05-03 DIAGNOSIS — IMO0002 Reserved for concepts with insufficient information to code with codable children: Secondary | ICD-10-CM | POA: Insufficient documentation

## 2013-05-03 DIAGNOSIS — J45909 Unspecified asthma, uncomplicated: Secondary | ICD-10-CM | POA: Insufficient documentation

## 2013-05-03 DIAGNOSIS — S298XXA Other specified injuries of thorax, initial encounter: Secondary | ICD-10-CM | POA: Insufficient documentation

## 2013-05-03 DIAGNOSIS — E860 Dehydration: Secondary | ICD-10-CM | POA: Insufficient documentation

## 2013-05-03 DIAGNOSIS — N189 Chronic kidney disease, unspecified: Secondary | ICD-10-CM | POA: Insufficient documentation

## 2013-05-03 DIAGNOSIS — Y929 Unspecified place or not applicable: Secondary | ICD-10-CM | POA: Insufficient documentation

## 2013-05-03 DIAGNOSIS — S139XXA Sprain of joints and ligaments of unspecified parts of neck, initial encounter: Secondary | ICD-10-CM | POA: Insufficient documentation

## 2013-05-03 DIAGNOSIS — S161XXA Strain of muscle, fascia and tendon at neck level, initial encounter: Secondary | ICD-10-CM

## 2013-05-03 DIAGNOSIS — S99919A Unspecified injury of unspecified ankle, initial encounter: Secondary | ICD-10-CM

## 2013-05-03 DIAGNOSIS — Z79899 Other long term (current) drug therapy: Secondary | ICD-10-CM | POA: Insufficient documentation

## 2013-05-03 DIAGNOSIS — S335XXA Sprain of ligaments of lumbar spine, initial encounter: Secondary | ICD-10-CM | POA: Insufficient documentation

## 2013-05-03 DIAGNOSIS — M25561 Pain in right knee: Secondary | ICD-10-CM

## 2013-05-03 DIAGNOSIS — E079 Disorder of thyroid, unspecified: Secondary | ICD-10-CM | POA: Insufficient documentation

## 2013-05-03 LAB — BASIC METABOLIC PANEL
BUN: 39 mg/dL — ABNORMAL HIGH (ref 6–23)
CO2: 23 mEq/L (ref 19–32)
Calcium: 10 mg/dL (ref 8.4–10.5)
Chloride: 104 mEq/L (ref 96–112)
Creatinine, Ser: 1.66 mg/dL — ABNORMAL HIGH (ref 0.50–1.10)
GFR calc Af Amer: 30 mL/min — ABNORMAL LOW (ref >90)
GFR, EST NON AFRICAN AMERICAN: 26 mL/min — AB (ref >90)
GLUCOSE: 96 mg/dL (ref 70–99)
Potassium: 3.9 mEq/L (ref 3.7–5.3)
SODIUM: 143 meq/L (ref 137–147)

## 2013-05-03 LAB — CBC WITH DIFFERENTIAL/PLATELET
Basophils Absolute: 0 10*3/uL (ref 0.0–0.1)
Basophils Relative: 0 % (ref 0–1)
EOS ABS: 0.2 10*3/uL (ref 0.0–0.7)
Eosinophils Relative: 2 % (ref 0–5)
HCT: 39.1 % (ref 36.0–46.0)
Hemoglobin: 13.5 g/dL (ref 12.0–15.0)
LYMPHS ABS: 2.2 10*3/uL (ref 0.7–4.0)
Lymphocytes Relative: 18 % (ref 12–46)
MCH: 33.8 pg (ref 26.0–34.0)
MCHC: 34.5 g/dL (ref 30.0–36.0)
MCV: 97.8 fL (ref 78.0–100.0)
MONOS PCT: 7 % (ref 3–12)
Monocytes Absolute: 0.8 10*3/uL (ref 0.1–1.0)
NEUTROS ABS: 9.3 10*3/uL — AB (ref 1.7–7.7)
NEUTROS PCT: 74 % (ref 43–77)
PLATELETS: 216 10*3/uL (ref 150–400)
RBC: 4 MIL/uL (ref 3.87–5.11)
RDW: 15.5 % (ref 11.5–15.5)
WBC: 12.6 10*3/uL — ABNORMAL HIGH (ref 4.0–10.5)

## 2013-05-03 LAB — URINALYSIS, ROUTINE W REFLEX MICROSCOPIC
Bilirubin Urine: NEGATIVE
GLUCOSE, UA: NEGATIVE mg/dL
HGB URINE DIPSTICK: NEGATIVE
Ketones, ur: NEGATIVE mg/dL
LEUKOCYTES UA: NEGATIVE
Nitrite: NEGATIVE
PROTEIN: NEGATIVE mg/dL
Specific Gravity, Urine: 1.015 (ref 1.005–1.030)
Urobilinogen, UA: 0.2 mg/dL (ref 0.0–1.0)
pH: 6 (ref 5.0–8.0)

## 2013-05-03 LAB — TROPONIN I: Troponin I: <0.3 ng/mL (ref <0.30)

## 2013-05-03 LAB — CK: CK TOTAL: 108 U/L (ref 7–177)

## 2013-05-03 LAB — THEOPHYLLINE LEVEL: THEOPHYLLINE LVL: 7.7 ug/mL — AB (ref 10.0–20.0)

## 2013-05-03 MED ORDER — ACETAMINOPHEN 325 MG PO TABS
650.0000 mg | ORAL_TABLET | Freq: Once | ORAL | Status: AC
  Administered 2013-05-03: 650 mg via ORAL
  Filled 2013-05-03: qty 2

## 2013-05-03 MED ORDER — ALBUTEROL SULFATE HFA 108 (90 BASE) MCG/ACT IN AERS
INHALATION_SPRAY | RESPIRATORY_TRACT | Status: AC
  Filled 2013-05-03: qty 6.7

## 2013-05-03 MED ORDER — ALBUTEROL SULFATE HFA 108 (90 BASE) MCG/ACT IN AERS
2.0000 | INHALATION_SPRAY | Freq: Once | RESPIRATORY_TRACT | Status: AC
  Administered 2013-05-03: 2 via RESPIRATORY_TRACT
  Filled 2013-05-03: qty 6.7

## 2013-05-03 MED ORDER — HYDROCODONE-ACETAMINOPHEN 5-325 MG PO TABS: 1.0000 | ORAL_TABLET | Freq: Four times a day (QID) | ORAL | Status: DC | PRN

## 2013-05-03 MED ORDER — FENTANYL CITRATE 0.05 MG/ML IJ SOLN
50.0000 ug | Freq: Once | INTRAMUSCULAR | Status: AC
  Administered 2013-05-03: 50 ug via INTRAVENOUS
  Filled 2013-05-03: qty 2

## 2013-05-03 NOTE — Discharge Instructions (Signed)
SEEK IMMEDIATE MEDICAL ATTENTION IF: New numbness, tingling, weakness, or problem with the use of your arms or legs.  Severe back pain not relieved with medications.  Change in bowel or bladder control (if you lose control of stool or urine, or if you are unable to urinate) Increasing pain in any areas of the body (such as chest or abdominal pain).  Shortness of breath, dizziness or fainting.  Nausea (feeling sick to your stomach), vomiting, fever, or sweats.  You have neck pain, possibly from a cervical strain and/or pinched nerve.   SEEK IMMEDIATE MEDICAL ATTENTION IF: You develop difficulties swallowing or breathing.  You have new or worse numbness, weakness, tingling, or movement problems in your arms or legs.  You develop increasing pain which is uncontrolled with medications.  You have change in bowel or bladder function, or other concerns.  You have had a head injury which does not appear to require admission at this time. A concussion is a state of changed mental ability from trauma.  SEEK IMMEDIATE MEDICAL ATTENTION IF: There is confusion or drowsiness (although children frequently become drowsy after injury).  You cannot awaken the injured person.  There is nausea (feeling sick to your stomach) or continued, forceful vomiting.  You notice dizziness or unsteadiness which is getting worse, or inability to walk.  You have convulsions or unconsciousness.  You experience severe, persistent headaches not relieved by Tylenol. (Do not take aspirin as this impairs clotting abilities). Take other pain medications only as directed.  You cannot use arms or legs normally.  There are changes in pupil sizes. (This is the black center in the colored part of the eye)  There is clear or bloody discharge from the nose or ears.  Change in speech, vision, swallowing, or understanding.  Localized weakness, numbness, tingling, or change in bowel or bladder control.

## 2013-05-03 NOTE — ED Notes (Signed)
Patient brought in via EMS. Alert, confused. Airway patent. No acute distress noted. Patient found in bathroom floor this morning by family. Patient unsure of when she fell. Per EMS family stated that the confusion was her normal mental status. Patient c/o back pain and left rib pain. Patient on LSB with C-collar in place. CBG per EMS 105.

## 2013-05-03 NOTE — ED Provider Notes (Signed)
CSN: [id number]     Arrival date & time 05/03/13  1041 History  This chart was scribed for Daisy Gaskins, MD by Quintella Reichert, ED scribe.  This patient was seen in room APA02/APA02 and the patient's care was started at 10:50 AM.   Chief Complaint  Patient presents with  . Fall  . rib pain   . Back Pain    Patient is a 80 y.o. female presenting with fall. The history is provided by the patient, the EMS personnel and a relative. No language interpreter was used.  Fall Episode onset: unknown, found lying on floor 2 hours ago. Episode frequency: Occurred one time. Associated symptoms comments: lower back pain, left-sided rib pain, and bilateral knee pain. She has tried nothing for the symptoms.    Level 5 Caveat: Confusion  HPI Comments: Lakeyta Vandenheuvel is a 80 y.o. female who presents to the Emergency Department complaining of a fall that occurred at an unknown time.  This morning at 9 AM pt's family found her lying on her wooden bathroom wooden floor.  Pt and family do not know when she fell or how long she was lying on the floor.  She is slightly confused but family states this is her normal baseline mental status and she has a h/o possible dementia.  Presently pt is complaining of severe lower back pain, left-sided rib pain, and bilateral knee pain.  She arrives on LSB with C-collar in place.  CBG was 105 en route.    Past Medical History  Diagnosis Date  . Hypertension   . Thyroid disease   . Asthma   . Hyperlipidemia   . Gout   . CKD (chronic kidney disease)     Past Surgical History  Procedure Laterality Date  . Rotator cuff repair      History reviewed. No pertinent family history.   History  Substance Use Topics  . Smoking status: Never Smoker   . Smokeless tobacco: Never Used  . Alcohol Use: No    OB History   Grav Para Term Preterm Abortions TAB SAB Ect Mult Living                  Review of Systems  Unable to perform ROS: Other  (confusion,  possible dementia)   Allergies  Ciprofloxacin and Macrobid  Home Medications   Current Outpatient Rx  Name  Route  Sig  Dispense  Refill  . albuterol (PROVENTIL HFA;VENTOLIN HFA) 108 (90 BASE) MCG/ACT inhaler   Inhalation   Inhale 2 puffs into the lungs every 6 (six) hours as needed for wheezing.   1 Inhaler   2   . allopurinol (ZYLOPRIM) 100 MG tablet   Oral   Take 2 tablets (200 mg total) by mouth daily.   60 tablet   11   . amLODipine (NORVASC) 5 MG tablet   Oral   Take 5 mg by mouth daily. As directed         . aspirin EC 81 MG tablet   Oral   Take 81 mg by mouth daily.         . brinzolamide (AZOPT) 1 % ophthalmic suspension   Right Eye   Place 1 drop into the right eye 2 (two) times daily.         . Calcium Carbonate-Vitamin D (CALCIUM-D) 600-400 MG-UNIT TABS   Oral   Take 1 tablet by mouth 2 (two) times daily.         Marland Kitchen  Cranberry 500 MG CAPS   Oral   Take 1 capsule by mouth daily.         . fish oil-omega-3 fatty acids 1000 MG capsule   Oral   Take 2 g by mouth 2 (two) times daily.         Marland Kitchen gabapentin (NEURONTIN) 100 MG capsule   Oral   Take 1 capsule (100 mg total) by mouth at bedtime.   30 capsule   11   . levothyroxine (SYNTHROID, LEVOTHROID) 75 MCG tablet   Oral   Take 1 tablet (75 mcg total) by mouth daily before breakfast.   30 tablet   5   . lisinopril-hydrochlorothiazide (PRINZIDE,ZESTORETIC) 20-12.5 MG per tablet   Oral   Take 1 tablet by mouth 2 (two) times daily.         Marland Kitchen LORazepam (ATIVAN) 0.5 MG tablet   Oral   Take 1 tablet (0.5 mg total) by mouth 2 (two) times daily. As directed   30 tablet   5   . Multiple Vitamin (MULTIVITAMIN WITH MINERALS) TABS tablet   Oral   Take 1 tablet by mouth daily.         Marland Kitchen nystatin cream (MYCOSTATIN)   Topical   Apply 1 application topically 2 (two) times daily.   30 g   1   . oseltamivir (TAMIFLU) 75 MG capsule   Oral   Take 1 capsule (75 mg total) by mouth daily.    10 capsule   0   . pantoprazole (PROTONIX) 40 MG tablet   Oral   Take 1 tablet (40 mg total) by mouth daily.   30 tablet   5   . salmeterol (SEREVENT) 50 MCG/DOSE diskus inhaler   Inhalation   Inhale 1 puff into the lungs 2 (two) times daily.   1 Inhaler   0   . sertraline (ZOLOFT) 50 MG tablet      1/2 tablet daily   15 tablet   0   . theophylline (THEODUR) 300 MG 12 hr tablet   Oral   Take 0.5 tablets (150 mg total) by mouth 2 (two) times daily. 1/2 tablet BID   60 tablet   11   . travoprost, benzalkonium, (TRAVATAN) 0.004 % ophthalmic solution   Right Eye   Place 1 drop into the right eye at bedtime.          BP 127/99  Pulse 70  Temp(Src) 98.5 F (36.9 C) (Oral)  Resp 20  Ht 5\' 4"  (1.626 m)  Wt 185 lb (83.915 kg)  BMI 31.74 kg/m2  SpO2 100%   Physical Exam CONSTITUTIONAL: Well developed/well nourished HEAD: Normocephalic/atraumatic EYES: EOMI/PERRL ENMT: Mucous membranes moist.  No evidence of facial/nasal trauma NECK: collar in place SPINE:entire spine nontender, No bruising/crepitance/stepoffs noted to spine Patient maintained in spinal precautions/logroll utilized Pt reports low back pain but no reproducible tenderness CV: S1/S2 noted, no murmurs/rubs/gallops noted LUNGS: Lungs are clear to auscultation bilaterally, no apparent distress CHEST: diffuse chest wall tenderness ABDOMEN: soft, nontender, no rebound or guarding GU:no cva tenderness NEURO: Pt is awake/alert, but appears confused, moves all extremitiesx4 EXTREMITIES: pulses normal, full ROM, tenderness with ROM of right hip, tenderness of palpation of right knee, bilateral knee tenderness, distal pulses equal/intact SKIN: warm, color normal PSYCH: no abnormalities of mood noted   ED Course  Procedures (including critical care time)  DIAGNOSTIC STUDIES: Oxygen Saturation is 100% on room air, normal by my interpretation.    COORDINATION OF CARE:  11:00 AM-Discussed treatment plan  which includes pain medication, EKG, imaging and labs with pt at bedside and pt agreed to plan.   2:40 PM Pt observed for several hours Labs/imaging negative.  Her theophylline level is not resulted but she does not appear toxic She requests d/c home She is awake/alert, and is not currently confused Family reports pt lives at home and has an aide during the day and family puts her to bed at night She reports she did not want to use bedside commode last night and got up to go to bathroom and fell I offered admission for pain control/monitoring but she insists on discharge She was ambulatory here with walker (report some back pain) but she is able to bear weight I did give short course of vicodin and advised of possible sedation and need to monitor symptoms We discussed strict return precautions  Labs Review Labs Reviewed  BASIC METABOLIC PANEL - Abnormal; Notable for the following:    BUN 39 (*)    Creatinine, Ser 1.66 (*)    GFR calc non Af Amer 26 (*)    GFR calc Af Amer 30 (*)    All other components within normal limits  CBC WITH DIFFERENTIAL - Abnormal; Notable for the following:    WBC 12.6 (*)    Neutro Abs 9.3 (*)    All other components within normal limits  CK  TROPONIN I  URINALYSIS, ROUTINE W REFLEX MICROSCOPIC  THEOPHYLLINE LEVEL    Imaging Review Dg Chest 1 View  05/03/2013   CLINICAL DATA:  Fall, pain  EXAM: CHEST - 1 VIEW  COMPARISON:  11/13/2012  FINDINGS: Mild cardiac enlargement stable. Vascular pattern normal. No consolidation or effusion. Bony thorax intact.  IMPRESSION: No acute traumatic injury   Electronically Signed   By: Esperanza Heir M.D.   On: 05/03/2013 12:37   Dg Lumbar Spine Complete  05/03/2013   CLINICAL DATA:  Fall, low back pain  EXAM: LUMBAR SPINE - COMPLETE 4+ VIEW  COMPARISON:  None.  FINDINGS: Moderate sigmoid scoliotic curvature of the lumbar spine. No fracture. Significant degenerative change throughout the lumbar spine with severe L1-2  and L2-3 degenerative disc disease and moderate L3-4 degenerative disc disease. Mild L4-5 and moderate L5-S1 degenerative disc disease noted. The aorta is calcified.  IMPRESSION: Advanced degenerative change with no acute findings   Electronically Signed   By: Esperanza Heir M.D.   On: 05/03/2013 12:40   Dg Hip Complete Right  05/03/2013   CLINICAL DATA:  Fall, pain  EXAM: RIGHT HIP - COMPLETE 2+ VIEW  COMPARISON:  None  FINDINGS: Mild degenerative change of the pubic symphysis and sacroiliac joints. Pelvic bones are intact. No evidence of proximal femur fracture. No evidence of hip dislocation.  IMPRESSION: No acute findings   Electronically Signed   By: Esperanza Heir M.D.   On: 05/03/2013 12:39   Dg Knee 2 Views Left  05/03/2013   CLINICAL DATA:  Fall, pain  EXAM: LEFT KNEE - 1-2 VIEW  COMPARISON:  None.  FINDINGS: Mild narrowing of the medial and lateral compartments with osteophyte formation. Mild patellofemoral osteophyte formation. Femoral and popliteal vascular calcification. No definite joint effusion. No fracture or dislocation.  IMPRESSION: No acute findings.  Degenerative changes noted.   Electronically Signed   By: Esperanza Heir M.D.   On: 05/03/2013 12:36   Dg Knee 2 Views Right  05/03/2013   CLINICAL DATA:  Fall, pain  EXAM: RIGHT KNEE - 1-2 VIEW  COMPARISON:  None  FINDINGS: Tiny joint effusion. Mild to moderate tricompartmental arthritis with joint space narrowing and osteophyte formation. There is femoral and popliteal vascular calcification. There is no fracture or dislocation.  IMPRESSION: Degenerative changes with no acute osseous abnormalities.   Electronically Signed   By: Esperanza Heir M.D.   On: 05/03/2013 12:36   Ct Head Wo Contrast  05/03/2013   CLINICAL DATA:  Altered mental status, fall.  EXAM: CT HEAD WITHOUT CONTRAST  CT CERVICAL SPINE WITHOUT CONTRAST  TECHNIQUE: Multidetector CT imaging of the head and cervical spine was performed following the standard protocol  without intravenous contrast. Multiplanar CT image reconstructions of the cervical spine were also generated.  COMPARISON:  CT scan of November 14, 2007.  FINDINGS: CT HEAD FINDINGS  Bony calvarium is intact. Mild diffuse cortical atrophy is noted. Minimal chronic ischemic white matter disease is noted. No mass effect or midline shift is noted. Ventricular size is within normal limits. There is no evidence of mass lesion, hemorrhage or acute infarction.  CT CERVICAL SPINE FINDINGS  Reversal of normal lordosis is noted. Severe degenerative disc disease is noted at C4-5, C5-6 and C6-7 with anterior osteophyte formation. Hypertrophy of posterior facet joints is noted at C2-3, C3-4 and C4-5 bilaterally consistent with degenerative joint disease. No fracture or significant spondylolisthesis is noted.  IMPRESSION: Mild diffuse cortical atrophy. Minimal chronic ischemic white matter disease. No acute intracranial abnormality seen.  Severe multilevel degenerative disc disease is noted. No acute abnormality seen in the cervical spine.   Electronically Signed   By: Roque Lias M.D.   On: 05/03/2013 12:05   Ct Cervical Spine Wo Contrast  05/03/2013   CLINICAL DATA:  Altered mental status, fall.  EXAM: CT HEAD WITHOUT CONTRAST  CT CERVICAL SPINE WITHOUT CONTRAST  TECHNIQUE: Multidetector CT imaging of the head and cervical spine was performed following the standard protocol without intravenous contrast. Multiplanar CT image reconstructions of the cervical spine were also generated.  COMPARISON:  CT scan of November 14, 2007.  FINDINGS: CT HEAD FINDINGS  Bony calvarium is intact. Mild diffuse cortical atrophy is noted. Minimal chronic ischemic white matter disease is noted. No mass effect or midline shift is noted. Ventricular size is within normal limits. There is no evidence of mass lesion, hemorrhage or acute infarction.  CT CERVICAL SPINE FINDINGS  Reversal of normal lordosis is noted. Severe degenerative disc disease is noted  at C4-5, C5-6 and C6-7 with anterior osteophyte formation. Hypertrophy of posterior facet joints is noted at C2-3, C3-4 and C4-5 bilaterally consistent with degenerative joint disease. No fracture or significant spondylolisthesis is noted.  IMPRESSION: Mild diffuse cortical atrophy. Minimal chronic ischemic white matter disease. No acute intracranial abnormality seen.  Severe multilevel degenerative disc disease is noted. No acute abnormality seen in the cervical spine.   Electronically Signed   By: Roque Lias M.D.   On: 05/03/2013 12:05    EKG Interpretation   None       MDM  No diagnosis found. Nursing notes including past medical history and social history reviewed and considered in documentation xrays reviewed and considered Labs/vital reviewed and considered    Date: 05/03/2013  Rate: 61  Rhythm: indeterminate  QRS Axis: normal  Intervals: normal  ST/T Wave abnormalities: nonspecific ST changes  Conduction Disutrbances:left bundle branch block  Narrative Interpretation: sinus rhythm but appears to have ?droppped beats.    Old EKG Reviewed: changes noted   EKG Interpretation    Date/Time:  Thursday  May 03 2013 11:15:33 EST Ventricular Rate:  61 PR Interval:  188 QRS Duration: 124 QT Interval:  450 QTC Calculation: 453 R Axis:   -6 Text Interpretation:  Sinus rhythm with marked sinus arrhythmia with occasional Premature ventricular complexes Left bundle branch block Abnormal ECG When compared with ECG of 05-Jun-2007 14:25, Premature ventricular complexes are now Present Abberant conduction is no longer Present Left bundle branch block is now Present Criteria for Septal infarct are no longer Present Confirmed by Bebe ShaggyWICKLINE  MD, Courtenay Creger [phone]([id number]) on 05/03/2013 1:46:24 PM           Repeat EKG appears more regular at this time EKG Interpretation    Date/Time:  Thursday May 03 2013 13:35:47 EST Ventricular Rate:  67 PR Interval:  186 QRS Duration: 114 QT  Interval:  434 QTC Calculation: 458 R Axis:   -8 Text Interpretation:  Sinus rhythm with marked sinus arrhythmia with occasional Premature ventricular complexes Septal infarct , age undetermined Abnormal ECG Confirmed by Bebe ShaggyWICKLINE  MD, Jager Koska [phone]([id number]) on 05/03/2013 1:46:49 PM               .I personally performed the services described in this documentation, which was scribed in my presence. The recorded information has been reviewed and is accurate.      Daisy Gaskinsonald W Danniell Rotundo, MD 05/03/13 [phone]

## 2013-05-07 ENCOUNTER — Other Ambulatory Visit: Payer: Self-pay | Admitting: *Deleted

## 2013-05-07 MED ORDER — LISINOPRIL-HYDROCHLOROTHIAZIDE 20-12.5 MG PO TABS: 1.0000 | ORAL_TABLET | Freq: Two times a day (BID) | ORAL | Status: DC

## 2013-05-07 MED ORDER — LORAZEPAM 0.5 MG PO TABS: 0.5000 mg | ORAL_TABLET | Freq: Two times a day (BID) | ORAL | Status: DC

## 2013-05-07 NOTE — Telephone Encounter (Signed)
This is okay to refill 

## 2013-05-07 NOTE — Telephone Encounter (Signed)
Both of these prescriptions are okay for 6 month

## 2013-05-07 NOTE — Telephone Encounter (Signed)
Patient last seen in office on 04-09-13. Rx last filled on 02-01-13 for #[id number]. Please advise. If approved please route to Pool A so nurse can phone in to pharmacy

## 2013-05-07 NOTE — Telephone Encounter (Signed)
Called into the drug store in [place] 

## 2013-05-10 ENCOUNTER — Other Ambulatory Visit: Payer: Self-pay | Admitting: *Deleted

## 2013-05-10 MED ORDER — SERTRALINE HCL 50 MG PO TABS: 25.0000 mg | ORAL_TABLET | Freq: Every day | ORAL | Status: DC

## 2013-05-30 ENCOUNTER — Telehealth: Payer: Self-pay | Admitting: Family Medicine

## 2013-05-31 NOTE — Telephone Encounter (Signed)
This is okay for a lift chair for this patient who is elderly has osteoarthritis and has frequent falls because of gait instability. Please okay this durable medical equipment and send a prescription and I will sign it

## 2013-06-04 NOTE — Telephone Encounter (Signed)
Faxed 06/04/13

## 2013-07-05 ENCOUNTER — Other Ambulatory Visit: Payer: Self-pay | Admitting: Family Medicine

## 2013-07-16 ENCOUNTER — Telehealth: Payer: Self-pay | Admitting: *Deleted

## 2013-07-16 MED ORDER — OSELTAMIVIR PHOSPHATE 75 MG PO CAPS: 75.0000 mg | ORAL_CAPSULE | Freq: Every day | ORAL | Status: DC

## 2013-07-16 NOTE — Telephone Encounter (Signed)
Flu exposure. Requesting Tamiflu.  Medication was [place] [facility] [place] by Dr. Christell ConstantMoore.

## 2013-08-07 ENCOUNTER — Encounter: Payer: Self-pay | Admitting: Family Medicine

## 2013-08-07 ENCOUNTER — Ambulatory Visit (INDEPENDENT_AMBULATORY_CARE_PROVIDER_SITE_OTHER): Payer: Medicare Other | Admitting: Family Medicine

## 2013-08-07 VITALS — BP 125/73 | HR 88 | Temp 97.2°F | Ht 64.0 in | Wt 181.0 lb

## 2013-08-07 DIAGNOSIS — E039 Hypothyroidism, unspecified: Secondary | ICD-10-CM

## 2013-08-07 DIAGNOSIS — I10 Essential (primary) hypertension: Secondary | ICD-10-CM

## 2013-08-07 DIAGNOSIS — J45909 Unspecified asthma, uncomplicated: Secondary | ICD-10-CM

## 2013-08-07 DIAGNOSIS — E559 Vitamin D deficiency, unspecified: Secondary | ICD-10-CM

## 2013-08-07 DIAGNOSIS — N189 Chronic kidney disease, unspecified: Secondary | ICD-10-CM

## 2013-08-07 DIAGNOSIS — Z23 Encounter for immunization: Secondary | ICD-10-CM

## 2013-08-07 DIAGNOSIS — E785 Hyperlipidemia, unspecified: Secondary | ICD-10-CM

## 2013-08-07 LAB — POCT CBC
GRANULOCYTE PERCENT: 58.8 % (ref 37–80)
HCT, POC: 40.5 % (ref 37.7–47.9)
Hemoglobin: 13.3 g/dL (ref 12.2–16.2)
LYMPH, POC: 2.4 (ref 0.6–3.4)
MCH, POC: 31.7 pg — AB (ref 27–31.2)
MCHC: 32.7 g/dL (ref 31.8–35.4)
MCV: 96.8 fL (ref 80–97)
MPV: 7.8 fL (ref 0–99.8)
PLATELET COUNT, POC: 257 10*3/uL (ref 142–424)
POC Granulocyte: 4.2 (ref 2–6.9)
POC LYMPH PERCENT: 33.9 %L (ref 10–50)
RBC: 4.2 M/uL (ref 4.04–5.48)
RDW, POC: 16.4 %
WBC: 7.1 10*3/uL (ref 4.6–10.2)

## 2013-08-07 NOTE — Patient Instructions (Addendum)
Medicare Annual Wellness Visit  [facility] and the medical providers at [facility] strive to bring you the best medical care.  In doing so we not only want to address your current medical conditions and concerns but also to detect new conditions early and prevent illness, disease and health-related problems.    Medicare offers a yearly Wellness Visit which allows our clinical staff to assess your need for preventative services including immunizations, lifestyle education, counseling to decrease risk of preventable diseases and screening for fall risk and other medical concerns.    This visit is provided free of charge (no copay) for all Medicare recipients. The clinical pharmacists at [facility] have begun to conduct these Wellness Visits which will also include a thorough review of all your medications.    As you primary medical provider recommend that you make an appointment for your Annual Wellness Visit if you have not done so already this year.  You may set up this appointment before you leave today or you may call back [phone] and schedule an appointment.  Please make sure when you call that you mention that you are scheduling your Annual Wellness Visit with the clinical pharmacist so that the appointment may be made for the proper length of time.      Continue current medications. Continue good therapeutic lifestyle changes which include good diet and exercise. Fall precautions discussed with patient. If an FOBT was given today- please return it to our front desk. If you are over 78 years old - you may need Prevnar 13 or the adult Pneumonia vaccine.  Return the FOBT, will call you with the results of the lab work as soon as possible The Prevnar vaccine he received today may make her arm sore Please be careful and move slowly  Wear your life line 20/7 Drink plenty of water

## 2013-08-07 NOTE — Progress Notes (Signed)
Subjective:    Patient ID: Daisy Scott, female    DOB: 10-01-1918, 80 y.o.   MRN: [id number]  HPI Pt here for follow up and management of chronic medical problems. The patient has with her caregiver today. She is due to receive Prevnar vaccine today. She will return the FOBT. The caregiver indicates that she is only by herself for about 2 hours daily between 5 and 7 PM. She has a lifeline that she wears at least during that time. Someone spends the night with her nightly.       Patient Active Problem List   Diagnosis Date Noted  . Asthma, chronic 04/09/2013  . History of gout 04/09/2013  . Hypertension 10/31/2012  . Hyperlipemia 10/31/2012  . Hypothyroid 10/31/2012  . CKD (chronic kidney disease) 10/31/2012  . Vitamin D deficiency 10/31/2012  . History of asthma 10/31/2012   Outpatient Encounter Prescriptions as of 08/07/2013  Medication Sig  . albuterol (PROVENTIL HFA;VENTOLIN HFA) 108 (90 BASE) MCG/ACT inhaler Inhale 2 puffs into the lungs every 6 (six) hours as needed for wheezing.  Marland Kitchen allopurinol (ZYLOPRIM) 100 MG tablet Take 2 tablets (200 mg total) by mouth daily.  Marland Kitchen amLODipine (NORVASC) 5 MG tablet Take 2.5 mg by mouth daily. As directed  . aspirin EC 81 MG tablet Take 81 mg by mouth daily.  . brinzolamide (AZOPT) 1 % ophthalmic suspension Place 1 drop into the right eye 2 (two) times daily.  . Calcium Carbonate-Vitamin D (CALCIUM-D) 600-400 MG-UNIT TABS Take 1 tablet by mouth 2 (two) times daily.  . Cranberry 500 MG CAPS Take 1 capsule by mouth daily.  . fish oil-omega-3 fatty acids 1000 MG capsule Take 2 g by mouth 2 (two) times daily.  Marland Kitchen gabapentin (NEURONTIN) 100 MG capsule Take 1 capsule (100 mg total) by mouth at bedtime.  Marland Kitchen levothyroxine (SYNTHROID, LEVOTHROID) 75 MCG tablet Take 1 tablet (75 mcg total) by mouth daily before breakfast.  . lisinopril-hydrochlorothiazide (PRINZIDE,ZESTORETIC) 20-12.5 MG per tablet TAKE ONE TABLET BY MOUTH TWICE DAILY  . LORazepam  (ATIVAN) 0.5 MG tablet Take 1 tablet (0.5 mg total) by mouth 2 (two) times daily. As directed  . Multiple Vitamin (MULTIVITAMIN WITH MINERALS) TABS tablet Take 1 tablet by mouth daily.  . pantoprazole (PROTONIX) 40 MG tablet Take 1 tablet (40 mg total) by mouth daily.  . salmeterol (SEREVENT) 50 MCG/DOSE diskus inhaler Inhale 1 puff into the lungs 2 (two) times daily.  . sertraline (ZOLOFT) 50 MG tablet TAKE 1/2 TABLET DAILY  . theophylline (THEODUR) 300 MG 12 hr tablet Take 0.5 tablets (150 mg total) by mouth 2 (two) times daily. 1/2 tablet BID  . travoprost, benzalkonium, (TRAVATAN) 0.004 % ophthalmic solution Place 1 drop into the right eye at bedtime.  . [DISCONTINUED] pantoprazole (PROTONIX) 40 MG tablet TAKE ONE (1) TABLET EACH DAY  . [DISCONTINUED] HYDROcodone-acetaminophen (NORCO/VICODIN) 5-325 MG per tablet Take 1 tablet by mouth every 6 (six) hours as needed.  . [DISCONTINUED] oseltamivir (TAMIFLU) 75 MG capsule Take 1 capsule (75 mg total) by mouth daily.    Review of Systems  Constitutional: Positive for fatigue.  HENT: Negative.   Eyes: Negative.   Respiratory: Negative.   Cardiovascular: Negative.   Gastrointestinal: Negative.   Endocrine: Negative.   Genitourinary: Negative.   Musculoskeletal: Negative.   Skin: Negative.   Allergic/Immunologic: Negative.   Neurological: Negative.   Hematological: Negative.   Psychiatric/Behavioral: Negative.        Objective:   Physical Exam  Nursing note and  vitals reviewed. Constitutional: She is oriented to person, place, and time. She appears well-developed and well-nourished.  The patient looks exceptionally good for her age of 26, she is alert and cooperative  HENT:  Head: Normocephalic and atraumatic.  Right Ear: External ear normal.  Left Ear: External ear normal.  Nose: Nose normal.  Mouth/Throat: Oropharynx is clear and moist.  Eyes: Conjunctivae and EOM are normal. Pupils are equal, round, and reactive to light.  Right eye exhibits no discharge. Left eye exhibits no discharge. No scleral icterus.  Neck: Normal range of motion. Neck supple. No thyromegaly present.  No carotid bruits  Cardiovascular: Normal rate, regular rhythm, normal heart sounds and intact distal pulses.  Exam reveals no gallop and no friction rub.   No murmur heard. At 84 per minute  Pulmonary/Chest: Effort normal and breath sounds normal. No respiratory distress. She has no wheezes. She has no rales.  Breath sounds are good anteriorly and posteriorly  Abdominal: Soft. Bowel sounds are normal. She exhibits no mass. There is tenderness (minimal tenderness right upper quadrant and suprapubic area). There is no rebound and no guarding.  Obesity without masses  Musculoskeletal: Normal range of motion. She exhibits no edema and no tenderness.  Good strength with upper and lower extremities inability to abduct and raise both arms do to pain bilaterally left greater than right  Lymphadenopathy:    She has no cervical adenopathy.  Neurological: She is alert and oriented to person, place, and time.  Skin: Skin is warm and dry. No rash noted.  Psychiatric: She has a normal mood and affect. Her behavior is normal. Judgment and thought content normal.   BP 125/73  Pulse 88  Temp(Src) 97.2 F (36.2 C) (Oral)  Ht _0  (1.626 m)  Wt 181 lb (82.101 kg)  BMI 31.05 kg/m2        Assessment & Plan:   1. CKD (chronic kidney disease) - POCT CBC - BMP8+EGFR  2. Hyperlipemia - POCT CBC - Lipid panel  3. Hypertension - POCT CBC - BMP8+EGFR - Hepatic function panel  4. Hypothyroid - POCT CBC - Thyroid Panel With TSH  5. Vitamin D deficiency - POCT CBC - Vit D  25 hydroxy (rtn osteoporosis monitoring)  6. Asthma, chronic  Patient Instructions                       Medicare Annual Wellness Visit  [facility] and the medical providers at [facility] strive to bring you the best medical care.  In doing so  we not only want to address your current medical conditions and concerns but also to detect new conditions early and prevent illness, disease and health-related problems.    Medicare offers a yearly Wellness Visit which allows our clinical staff to assess your need for preventative services including immunizations, lifestyle education, counseling to decrease risk of preventable diseases and screening for fall risk and other medical concerns.    This visit is provided free of charge (no copay) for all Medicare recipients. The clinical pharmacists at [facility] have begun to conduct these Wellness Visits which will also include a thorough review of all your medications.    As you primary medical provider recommend that you make an appointment for your Annual Wellness Visit if you have not done so already this year.  You may set up this appointment before you leave today or you may call back ([phone]) and schedule an appointment.  Please make sure when you call that you mention that you are scheduling your Annual Wellness Visit with the clinical pharmacist so that the appointment may be made for the proper length of time.      Continue current medications. Continue good therapeutic lifestyle changes which include good diet and exercise. Fall precautions discussed with patient. If an FOBT was given today- please return it to our front desk. If you are over 53 years old - you may need Prevnar 12 or the adult Pneumonia vaccine.  Return the FOBT, will call you with the results of the lab work as soon as possible The Prevnar vaccine he received today may make her arm sore Please be careful and move slowly  Wear your life line 20/7 Drink plenty of water    Arrie Senate MD

## 2013-08-07 NOTE — Addendum Note (Signed)
Addended by: Magdalene RiverBULLINS, Vishaal Strollo H on: 08/07/2013 03:12 PM   Modules accepted: Orders

## 2013-08-08 ENCOUNTER — Encounter: Payer: Self-pay | Admitting: Family Medicine

## 2013-08-08 LAB — HEPATIC FUNCTION PANEL
ALBUMIN: 4.3 g/dL (ref 3.2–4.6)
ALT: 10 IU/L (ref 0–32)
AST: 22 IU/L (ref 0–40)
Alkaline Phosphatase: 55 IU/L (ref 39–117)
Bilirubin, Direct: 0.1 mg/dL (ref 0.00–0.40)
Total Bilirubin: 0.3 mg/dL (ref 0.0–1.2)
Total Protein: 7 g/dL (ref 6.0–8.5)

## 2013-08-08 LAB — THYROID PANEL WITH TSH
FREE THYROXINE INDEX: 2.2 (ref 1.2–4.9)
T3 Uptake Ratio: 28 % (ref 24–39)
T4, Total: 7.7 ug/dL (ref 4.5–12.0)
TSH: 1.36 u[IU]/mL (ref 0.450–4.500)

## 2013-08-08 LAB — LIPID PANEL
Chol/HDL Ratio: 2.6 ratio units (ref 0.0–4.4)
Cholesterol, Total: 253 mg/dL — ABNORMAL HIGH (ref 100–199)
HDL: 97 mg/dL (ref >39)
LDL Calculated: 135 mg/dL — ABNORMAL HIGH (ref 0–99)
TRIGLYCERIDES: 105 mg/dL (ref 0–149)
VLDL CHOLESTEROL CAL: 21 mg/dL (ref 5–40)

## 2013-08-08 LAB — BMP8+EGFR
BUN / CREAT RATIO: 18 (ref 11–26)
BUN: 33 mg/dL (ref 10–36)
CALCIUM: 10.6 mg/dL — AB (ref 8.7–10.3)
CHLORIDE: 102 mmol/L (ref 97–108)
CO2: 24 mmol/L (ref 18–29)
Creatinine, Ser: 1.84 mg/dL — ABNORMAL HIGH (ref 0.57–1.00)
GFR calc Af Amer: 27 mL/min/{1.73_m2} — ABNORMAL LOW (ref >59)
GFR calc non Af Amer: 23 mL/min/{1.73_m2} — ABNORMAL LOW (ref >59)
Glucose: 96 mg/dL (ref 65–99)
Potassium: 5.5 mmol/L — ABNORMAL HIGH (ref 3.5–5.2)
Sodium: 145 mmol/L — ABNORMAL HIGH (ref 134–144)

## 2013-08-08 LAB — VITAMIN D 25 HYDROXY (VIT D DEFICIENCY, FRACTURES): Vit D, 25-Hydroxy: 54.9 ng/mL (ref 30.0–100.0)

## 2013-08-22 ENCOUNTER — Other Ambulatory Visit: Payer: Medicare Other

## 2013-08-22 DIAGNOSIS — Z1212 Encounter for screening for malignant neoplasm of rectum: Secondary | ICD-10-CM

## 2013-08-24 LAB — FECAL OCCULT BLOOD, IMMUNOCHEMICAL: FECAL OCCULT BLD: NEGATIVE

## 2013-09-05 ENCOUNTER — Other Ambulatory Visit: Payer: Self-pay | Admitting: Family Medicine

## 2013-09-05 ENCOUNTER — Other Ambulatory Visit (INDEPENDENT_AMBULATORY_CARE_PROVIDER_SITE_OTHER): Payer: Medicare Other

## 2013-09-05 DIAGNOSIS — I10 Essential (primary) hypertension: Secondary | ICD-10-CM

## 2013-09-05 NOTE — Progress Notes (Signed)
Pt came in for labs only 

## 2013-09-06 LAB — BMP8+EGFR
BUN/Creatinine Ratio: 17 (ref 11–26)
BUN: 31 mg/dL (ref 10–36)
CALCIUM: 9.8 mg/dL (ref 8.7–10.3)
CHLORIDE: 103 mmol/L (ref 97–108)
CO2: 18 mmol/L (ref 18–29)
Creatinine, Ser: 1.87 mg/dL — ABNORMAL HIGH (ref 0.57–1.00)
GFR calc Af Amer: 26 mL/min/{1.73_m2} — ABNORMAL LOW (ref >59)
GFR calc non Af Amer: 23 mL/min/{1.73_m2} — ABNORMAL LOW (ref >59)
GLUCOSE: 86 mg/dL (ref 65–99)
Potassium: 4.9 mmol/L (ref 3.5–5.2)
Sodium: 143 mmol/L (ref 134–144)

## 2013-09-27 ENCOUNTER — Other Ambulatory Visit: Payer: Self-pay | Admitting: Family Medicine

## 2013-10-10 ENCOUNTER — Other Ambulatory Visit: Payer: Self-pay | Admitting: Family Medicine

## 2013-11-07 ENCOUNTER — Encounter: Payer: Self-pay | Admitting: Family Medicine

## 2013-11-08 ENCOUNTER — Other Ambulatory Visit: Payer: Self-pay | Admitting: Pharmacist

## 2013-11-08 NOTE — Progress Notes (Signed)
Discussed patient's medication administration and sleeping habits with patient's daughter Daisy Scott.  There is concern that Daisy Scott is going to bed at 8:30pm and that caregivers is not getting her out of bed until around 10 or 11am.  That is also when she take her mroning doses of medications.  I recommended that patient no be allowed to stay in bed more than 12 hours at a time due to increased risk of bed sores.  She was take 1-2 short nap late am or in afternoon if need as long as this does not adversely affect night tiime sleep.  Also 2 of her medications should be given every 12 hours - serevent and theophylline.

## 2013-11-13 ENCOUNTER — Encounter: Payer: Self-pay | Admitting: Family Medicine

## 2013-11-13 ENCOUNTER — Telehealth: Payer: Self-pay | Admitting: Family Medicine

## 2013-11-13 NOTE — Telephone Encounter (Signed)
This has been taking care of with a reply note to the patient's daughter

## 2013-11-15 ENCOUNTER — Ambulatory Visit (INDEPENDENT_AMBULATORY_CARE_PROVIDER_SITE_OTHER): Payer: Medicare Other | Admitting: Family Medicine

## 2013-11-15 ENCOUNTER — Encounter: Payer: Self-pay | Admitting: Family Medicine

## 2013-11-15 ENCOUNTER — Telehealth: Payer: Self-pay | Admitting: Family Medicine

## 2013-11-15 VITALS — BP 105/61 | HR 79 | Temp 97.2°F | Ht 64.0 in | Wt 180.0 lb

## 2013-11-15 DIAGNOSIS — R5383 Other fatigue: Secondary | ICD-10-CM

## 2013-11-15 DIAGNOSIS — R5382 Chronic fatigue, unspecified: Secondary | ICD-10-CM

## 2013-11-15 DIAGNOSIS — R5381 Other malaise: Secondary | ICD-10-CM

## 2013-11-15 DIAGNOSIS — R197 Diarrhea, unspecified: Secondary | ICD-10-CM

## 2013-11-15 DIAGNOSIS — A084 Viral intestinal infection, unspecified: Secondary | ICD-10-CM

## 2013-11-15 DIAGNOSIS — A088 Other specified intestinal infections: Secondary | ICD-10-CM

## 2013-11-15 LAB — POCT CBC
Granulocyte percent: 59.1 %G (ref 37–80)
HCT, POC: 37.7 % (ref 37.7–47.9)
Hemoglobin: 12.2 g/dL (ref 12.2–16.2)
LYMPH, POC: 2.2 (ref 0.6–3.4)
MCH: 31.1 pg (ref 27–31.2)
MCHC: 32.4 g/dL (ref 31.8–35.4)
MCV: 96 fL (ref 80–97)
MPV: 7.2 fL (ref 0–99.8)
PLATELET COUNT, POC: 242 10*3/uL (ref 142–424)
POC Granulocyte: 4.3 (ref 2–6.9)
POC LYMPH %: 30.3 % (ref 10–50)
RBC: 3.9 M/uL — AB (ref 4.04–5.48)
RDW, POC: 15.8 %
WBC: 7.3 10*3/uL (ref 4.6–10.2)

## 2013-11-15 NOTE — Patient Instructions (Signed)
Continue clear liquids, avoid caffeine milk cheese ice cream and dairy products. For a short period of time hold the protonix Gradually increase diet while continuing clear liquids to things that are baked and broiled and not fried Continue to use Imodium if more than 3-4 loose bowel movements daily Continue to make sure that the patient does not get weak and fall. Small amounts of fluids at a time are better than larger amounts less frequently

## 2013-11-15 NOTE — Telephone Encounter (Signed)
Appt scheduled for this afternoon. Patient's daughter aware.

## 2013-11-15 NOTE — Progress Notes (Signed)
Subjective:    Patient ID: Daisy Scott, female    DOB: 1919/02/20, 80 y.o.   MRN: [id number]  HPI Patient here today for loose stools for about 1 week. The patient has had this for about one week with rare vomiting and some nausea. The patient comes today with her daughter and caregiver. Initially the stools were watery in the rubs or 6 daily. She's had 2 loose bowel movements today. There is no history of antibiotic use. She does eat a lot of ice cream and has milk and dairy products. She does not drink caffeine. One family member had loose bowel movements and that family member is better. No one else is sick in the household.        Patient Active Problem List   Diagnosis Date Noted  . Asthma, chronic 04/09/2013  . History of gout 04/09/2013  . Hypertension 10/31/2012  . Hyperlipemia 10/31/2012  . Hypothyroid 10/31/2012  . CKD (chronic kidney disease) 10/31/2012  . Vitamin D deficiency 10/31/2012  . History of asthma 10/31/2012   Outpatient Encounter Prescriptions as of 11/15/2013  Medication Sig  . albuterol (PROVENTIL HFA;VENTOLIN HFA) 108 (90 BASE) MCG/ACT inhaler Inhale 2 puffs into the lungs every 6 (six) hours as needed for wheezing.  Marland Kitchen allopurinol (ZYLOPRIM) 100 MG tablet TAKE TWO TABLETS BY MOUTH DAILY  . amLODipine (NORVASC) 5 MG tablet TAKE ONE (1) TABLET EACH DAY  . aspirin EC 81 MG tablet Take 81 mg by mouth daily.  . brinzolamide (AZOPT) 1 % ophthalmic suspension Place 1 drop into the right eye 2 (two) times daily.  . Calcium Carbonate-Vitamin D (CALCIUM-D) 600-400 MG-UNIT TABS Take 1 tablet by mouth 2 (two) times daily.  . Cranberry 500 MG CAPS Take 1 capsule by mouth daily.  . fish oil-omega-3 fatty acids 1000 MG capsule Take 2 g by mouth 2 (two) times daily.  Marland Kitchen gabapentin (NEURONTIN) 100 MG capsule TAKE ONE CAPSULE BY MOUTH AT BEDTIME  . levothyroxine (SYNTHROID, LEVOTHROID) 75 MCG tablet TAKE ONE TABLET EVERY MORNING  . lisinopril-hydrochlorothiazide  (PRINZIDE,ZESTORETIC) 20-12.5 MG per tablet TAKE ONE TABLET BY MOUTH TWICE DAILY  . LORazepam (ATIVAN) 0.5 MG tablet Take 1 tablet (0.5 mg total) by mouth 2 (two) times daily. As directed  . Multiple Vitamin (MULTIVITAMIN WITH MINERALS) TABS tablet Take 1 tablet by mouth daily.  . pantoprazole (PROTONIX) 40 MG tablet Take 1 tablet (40 mg total) by mouth daily.  . salmeterol (SEREVENT) 50 MCG/DOSE diskus inhaler Inhale 1 puff into the lungs 2 (two) times daily.  . sertraline (ZOLOFT) 50 MG tablet TAKE 1/2 TABLET DAILY  . theophylline (THEODUR) 300 MG 12 hr tablet Take 0.5 tablets (150 mg total) by mouth 2 (two) times daily. 1/2 tablet BID  . travoprost, benzalkonium, (TRAVATAN) 0.004 % ophthalmic solution Place 1 drop into the right eye at bedtime.    Review of Systems  Constitutional: Negative.   HENT: Negative.   Eyes: Negative.   Respiratory: Negative.   Cardiovascular: Negative.   Gastrointestinal: Positive for nausea, vomiting (x 1) and diarrhea.  Endocrine: Negative.   Genitourinary: Negative.   Musculoskeletal: Negative.   Skin: Negative.   Allergic/Immunologic: Negative.   Neurological: Negative.   Hematological: Negative.   Psychiatric/Behavioral: Negative.        Objective:   Physical Exam  Nursing note and vitals reviewed. Constitutional: She is oriented to person, place, and time. She appears well-developed and well-nourished. No distress.  The patient comes in in a wheelchair and is alert  cheerful and smiling in her usual manner. There is a lot of borborygmi  HENT:  Head: Normocephalic and atraumatic.  Right Ear: External ear normal.  Left Ear: External ear normal.  Nose: Nose normal.  Mouth/Throat: Oropharynx is clear and moist. No oropharyngeal exudate.  Eyes: Conjunctivae and EOM are normal. Pupils are equal, round, and reactive to light. Right eye exhibits no discharge. Left eye exhibits no discharge. No scleral icterus.  Neck: Normal range of motion. Neck  supple. No thyromegaly present.  Cardiovascular: Normal rate, regular rhythm and normal heart sounds.  Exam reveals no friction rub.   Pulmonary/Chest: Effort normal and breath sounds normal. No respiratory distress. She has no wheezes. She has no rales. She exhibits no tenderness.  Abdominal: Soft. Bowel sounds are normal. She exhibits no mass. There is tenderness (there is mild generalized tenderness not located to any part of the abdomen). There is no rebound and no guarding.  Musculoskeletal: She exhibits no edema and no tenderness.  Patient is in wheelchair  Lymphadenopathy:    She has no cervical adenopathy.  Neurological: She is alert and oriented to person, place, and time.  Skin: Skin is warm and dry. No rash noted.  Psychiatric: She has a normal mood and affect. Her behavior is normal. Judgment and thought content normal.   BP 105/61  Pulse 79  Temp(Src) 97.2 F (36.2 C) (Oral)  Ht '5\' 4"'  (1.626 m)  Wt 180 lb (81.647 kg)  BMI 30.88 kg/m2        Assessment & Plan:  1. Diarrhea - POCT CBC - BMP8+EGFR - Hepatic function panel - Thyroid Panel With TSH  2. Chronic fatigue  3. Viral gastroenteritis  Patient Instructions  Continue clear liquids, avoid caffeine milk cheese ice cream and dairy products. For a short period of time hold the protonix Gradually increase diet while continuing clear liquids to things that are baked and broiled and not fried Continue to use Imodium if more than 3-4 loose bowel movements daily Continue to make sure that the patient does not get weak and fall. Small amounts of fluids at a time are better than larger amounts less frequently   Arrie Senate MD

## 2013-11-16 LAB — BMP8+EGFR
BUN/Creatinine Ratio: 19 (ref 11–26)
BUN: 33 mg/dL (ref 10–36)
CALCIUM: 9.1 mg/dL (ref 8.7–10.3)
CHLORIDE: 102 mmol/L (ref 97–108)
CO2: 21 mmol/L (ref 18–29)
Creatinine, Ser: 1.77 mg/dL — ABNORMAL HIGH (ref 0.57–1.00)
GFR calc Af Amer: 28 mL/min/{1.73_m2} — ABNORMAL LOW (ref >59)
GFR calc non Af Amer: 24 mL/min/{1.73_m2} — ABNORMAL LOW (ref >59)
Glucose: 86 mg/dL (ref 65–99)
POTASSIUM: 4.1 mmol/L (ref 3.5–5.2)
SODIUM: 138 mmol/L (ref 134–144)

## 2013-11-16 LAB — THYROID PANEL WITH TSH
FREE THYROXINE INDEX: 2.8 (ref 1.2–4.9)
T3 UPTAKE RATIO: 31 % (ref 24–39)
T4 TOTAL: 9.1 ug/dL (ref 4.5–12.0)
TSH: 3.4 u[IU]/mL (ref 0.450–4.500)

## 2013-11-16 LAB — HEPATIC FUNCTION PANEL
ALT: 9 IU/L (ref 0–32)
AST: 24 IU/L (ref 0–40)
Albumin: 4 g/dL (ref 3.2–4.6)
Alkaline Phosphatase: 52 IU/L (ref 39–117)
Bilirubin, Direct: 0.13 mg/dL (ref 0.00–0.40)
TOTAL PROTEIN: 6.2 g/dL (ref 6.0–8.5)
Total Bilirubin: 0.4 mg/dL (ref 0.0–1.2)

## 2013-11-19 ENCOUNTER — Telehealth: Payer: Self-pay | Admitting: Family Medicine

## 2013-11-19 NOTE — Telephone Encounter (Signed)
Diarrhea has resolved. She has had 2 normal bowel movements since her office visit. No diarrhea. She is not taking imodium currently and is holding her acid reflux medication. Denies reflux at this time. Advised that she continue to hold the reflux medication and if it becomes a problem to let us know. No need to do stool culture since BMs are normal. If diarrhea returns she should do them and contact our office. Caregiver stated understanding and agreement to plan.

## 2013-11-22 ENCOUNTER — Encounter: Payer: Self-pay | Admitting: Family Medicine

## 2013-11-27 ENCOUNTER — Encounter: Payer: Self-pay | Admitting: Family Medicine

## 2013-12-05 ENCOUNTER — Other Ambulatory Visit: Payer: Medicare Other

## 2013-12-05 DIAGNOSIS — Z1212 Encounter for screening for malignant neoplasm of rectum: Secondary | ICD-10-CM

## 2013-12-05 DIAGNOSIS — R197 Diarrhea, unspecified: Secondary | ICD-10-CM

## 2013-12-05 NOTE — Progress Notes (Signed)
Lab only 

## 2013-12-06 LAB — CLOSTRIDIUM DIFFICILE BY PCR: CDIFFPCR: NEGATIVE

## 2013-12-07 LAB — OVA AND PARASITE EXAMINATION

## 2013-12-07 LAB — FECAL OCCULT BLOOD, IMMUNOCHEMICAL: FECAL OCCULT BLD: NEGATIVE

## 2013-12-10 LAB — STOOL CULTURE: E coli, Shiga toxin Assay: NEGATIVE

## 2013-12-11 ENCOUNTER — Ambulatory Visit (INDEPENDENT_AMBULATORY_CARE_PROVIDER_SITE_OTHER): Payer: Medicare Other | Admitting: Family Medicine

## 2013-12-11 ENCOUNTER — Encounter: Payer: Self-pay | Admitting: Family Medicine

## 2013-12-11 VITALS — BP 133/80 | HR 79 | Temp 97.5°F | Ht 64.0 in | Wt 179.0 lb

## 2013-12-11 DIAGNOSIS — E559 Vitamin D deficiency, unspecified: Secondary | ICD-10-CM

## 2013-12-11 DIAGNOSIS — I10 Essential (primary) hypertension: Secondary | ICD-10-CM

## 2013-12-11 DIAGNOSIS — E785 Hyperlipidemia, unspecified: Secondary | ICD-10-CM

## 2013-12-11 DIAGNOSIS — E039 Hypothyroidism, unspecified: Secondary | ICD-10-CM

## 2013-12-11 LAB — POCT CBC
Granulocyte percent: 63.7 %G (ref 37–80)
HEMATOCRIT: 40 % (ref 37.7–47.9)
Hemoglobin: 13.1 g/dL (ref 12.2–16.2)
Lymph, poc: 2.7 (ref 0.6–3.4)
MCH: 31.6 pg — AB (ref 27–31.2)
MCHC: 32.7 g/dL (ref 31.8–35.4)
MCV: 96.7 fL (ref 80–97)
MPV: 7.5 fL (ref 0–99.8)
POC GRANULOCYTE: 5.4 (ref 2–6.9)
POC LYMPH PERCENT: 31.7 %L (ref 10–50)
Platelet Count, POC: 295 10*3/uL (ref 142–424)
RBC: 4.1 M/uL (ref 4.04–5.48)
RDW, POC: 15.9 %
WBC: 8.4 10*3/uL (ref 4.6–10.2)

## 2013-12-11 NOTE — Patient Instructions (Addendum)
Medicare Annual Wellness Visit  [facility] and the medical providers at [facility] strive to bring you the best medical care.  In doing so we not only want to address your current medical conditions and concerns but also to detect new conditions early and prevent illness, disease and health-related problems.    Medicare offers a yearly Wellness Visit which allows our clinical staff to assess your need for preventative services including immunizations, lifestyle education, counseling to decrease risk of preventable diseases and screening for fall risk and other medical concerns.    This visit is provided free of charge (no copay) for all Medicare recipients. The clinical pharmacists at [facility] have begun to conduct these Wellness Visits which will also include a thorough review of all your medications.    As you primary medical provider recommend that you make an appointment for your Annual Wellness Visit if you have not done so already this year.  You may set up this appointment before you leave today or you may call back ([phone]) and schedule an appointment.  Please make sure when you call that you mention that you are scheduling your Annual Wellness Visit with the clinical pharmacist so that the appointment may be made for the proper length of time.      Continue current medications. Continue good therapeutic lifestyle changes which include good diet and exercise. Fall precautions discussed with patient. If an FOBT was given today- please return it to our front desk. If you are over 7 years old - you may need Prevnar 13 or the adult Pneumonia vaccine.  Flu Shots will be available at our office starting mid- September. Please call and schedule a FLU CLINIC APPOINTMENT.   Continue drinking plenty of fluids If you need additional medication for heartburn or reflux he try Zantac 150 over-the-counter Otherwise, continue  with the pantoprazole as doing State as active as possible but continuing to be careful and don't put yourself at risk for falling

## 2013-12-11 NOTE — Progress Notes (Signed)
Subjective:    Patient ID: Daisy Scott, female    DOB: [birth year], 80 y.o.   MRN: [id number]  HPI Pt here for follow up and management of chronic medical problems. The patient comes to the visit today with her care provider. It is of note that the recent bout with diarrhea has resolved and all of the tests that we did on her GI tract for her stools were negative. She looks good and a sling and seems to be in good spirits. She would additional lab work today. No refills are necessary and she has no specific complaints. She is currently taking the pantoprazole every other day and doing well with this.         Patient Active Problem List   Diagnosis Date Noted  . Asthma, chronic 04/09/2013  . History of gout 04/09/2013  . Hypertension 10/31/2012  . Hyperlipemia 10/31/2012  . Hypothyroid 10/31/2012  . CKD (chronic kidney disease) 10/31/2012  . Vitamin D deficiency 10/31/2012  . History of asthma 10/31/2012   Outpatient Encounter Prescriptions as of 12/11/2013  Medication Sig  . albuterol (PROVENTIL HFA;VENTOLIN HFA) 108 (90 BASE) MCG/ACT inhaler Inhale 2 puffs into the lungs every 6 (six) hours as needed for wheezing.  Marland Kitchen allopurinol (ZYLOPRIM) 100 MG tablet TAKE TWO TABLETS BY MOUTH DAILY  . amLODipine (NORVASC) 5 MG tablet TAKE ONE (1) TABLET EACH DAY  . aspirin EC 81 MG tablet Take 81 mg by mouth daily.  . brinzolamide (AZOPT) 1 % ophthalmic suspension Place 1 drop into the right eye 2 (two) times daily.  . Calcium Carbonate-Vitamin D (CALCIUM-D) 600-400 MG-UNIT TABS Take 1 tablet by mouth 2 (two) times daily.  . Cranberry 500 MG CAPS Take 1 capsule by mouth daily.  . fish oil-omega-3 fatty acids 1000 MG capsule Take 2 g by mouth 2 (two) times daily.  Marland Kitchen gabapentin (NEURONTIN) 100 MG capsule TAKE ONE CAPSULE BY MOUTH AT BEDTIME  . levothyroxine (SYNTHROID, LEVOTHROID) 75 MCG tablet TAKE ONE TABLET EVERY MORNING  . lisinopril-hydrochlorothiazide (PRINZIDE,ZESTORETIC) 20-12.5  MG per tablet TAKE ONE TABLET BY MOUTH TWICE DAILY  . LORazepam (ATIVAN) 0.5 MG tablet Take 1 tablet (0.5 mg total) by mouth 2 (two) times daily. As directed  . Multiple Vitamin (MULTIVITAMIN WITH MINERALS) TABS tablet Take 1 tablet by mouth daily.  . pantoprazole (PROTONIX) 40 MG tablet Take 1 tablet (40 mg total) by mouth daily.  . salmeterol (SEREVENT) 50 MCG/DOSE diskus inhaler Inhale 1 puff into the lungs 2 (two) times daily.  . sertraline (ZOLOFT) 50 MG tablet TAKE 1/2 TABLET DAILY  . theophylline (THEODUR) 300 MG 12 hr tablet Take 0.5 tablets (150 mg total) by mouth 2 (two) times daily. 1/2 tablet BID  . travoprost, benzalkonium, (TRAVATAN) 0.004 % ophthalmic solution Place 1 drop into the right eye at bedtime.    Review of Systems  Constitutional: Negative.   HENT: Negative.   Eyes: Negative.   Respiratory: Negative.   Cardiovascular: Negative.   Gastrointestinal: Negative.   Endocrine: Negative.   Genitourinary: Negative.   Musculoskeletal: Negative.   Skin: Negative.   Allergic/Immunologic: Negative.   Neurological: Negative.   Hematological: Negative.   Psychiatric/Behavioral: Negative.        Objective:   Physical Exam  Nursing note and vitals reviewed. Constitutional: She is oriented to person, place, and time. She appears well-developed and well-nourished. No distress.  The patient is pleasant and cooperative and looks wonderful for her age of 47  HENT:  Head: Normocephalic and  atraumatic.  Right Ear: External ear normal.  Left Ear: External ear normal.  Nose: Nose normal.  Mouth/Throat: Oropharynx is clear and moist. No oropharyngeal exudate.  Eyes: Conjunctivae and EOM are normal. Pupils are equal, round, and reactive to light. Right eye exhibits no discharge. Left eye exhibits no discharge. No scleral icterus.  Neck: Normal range of motion. Neck supple. No thyromegaly present.  There is no thyromegaly or adenopathy  Cardiovascular: Normal rate, regular  rhythm, normal heart sounds and intact distal pulses.  Exam reveals no gallop and no friction rub.   No murmur heard. The rhythm is regular at 72 per minute  Pulmonary/Chest: Effort normal and breath sounds normal. No respiratory distress. She has no wheezes. She has no rales. She exhibits no tenderness.  Abdominal: Soft. Bowel sounds are normal. She exhibits no mass. There is no tenderness. There is no rebound and no guarding.  The abdomen is obese with no organ enlargement and minimal tenderness with palpation  Musculoskeletal: Normal range of motion. She exhibits no edema and no tenderness.  The patient uses a walker for ambulation  Lymphadenopathy:    She has no cervical adenopathy.  Neurological: She is alert and oriented to person, place, and time. She has normal reflexes. No cranial nerve deficit.  Skin: Skin is warm and dry. No rash noted.  Psychiatric: She has a normal mood and affect. Her behavior is normal. Judgment and thought content normal.   BP 133/80  Pulse 79  Temp(Src) 97.5 F (36.4 C) (Oral)  Ht '5\' 4"'  (1.626 m)  Wt 179 lb (81.194 kg)  BMI 30.71 kg/m2        Assessment & Plan:  1. Essential hypertension - POCT CBC - BMP8+EGFR - Hepatic function panel  2. Hyperlipemia - POCT CBC - Lipid panel  3. Vitamin D deficiency - POCT CBC - Vit D  25 hydroxy (rtn osteoporosis monitoring)  4. Hypothyroidism, unspecified hypothyroidism type - POCT CBC   Patient Instructions                       Medicare Annual Wellness Visit  [facility] and the medical providers at [facility] strive to bring you the best medical care.  In doing so we not only want to address your current medical conditions and concerns but also to detect new conditions early and prevent illness, disease and health-related problems.    Medicare offers a yearly Wellness Visit which allows our clinical staff to assess your need for preventative services including  immunizations, lifestyle education, counseling to decrease risk of preventable diseases and screening for fall risk and other medical concerns.    This visit is provided free of charge (no copay) for all Medicare recipients. The clinical pharmacists at [facility] have begun to conduct these Wellness Visits which will also include a thorough review of all your medications.    As you primary medical provider recommend that you make an appointment for your Annual Wellness Visit if you have not done so already this year.  You may set up this appointment before you leave today or you may call back ([phone]) and schedule an appointment.  Please make sure when you call that you mention that you are scheduling your Annual Wellness Visit with the clinical pharmacist so that the appointment may be made for the proper length of time.      Continue current medications. Continue good therapeutic lifestyle changes which include good diet  and exercise. Fall precautions discussed with patient. If an FOBT was given today- please return it to our front desk. If you are over 66 years old - you may need Prevnar 25 or the adult Pneumonia vaccine.  Flu Shots will be available at our office starting mid- September. Please call and schedule a FLU CLINIC APPOINTMENT.   Continue drinking plenty of fluids If you need additional medication for heartburn or reflux he try Zantac 150 over-the-counter Otherwise, continue with the pantoprazole as doing State as active as possible but continuing to be careful and don't put yourself at risk for falling    Arrie Senate MD

## 2013-12-12 LAB — BMP8+EGFR
BUN/Creatinine Ratio: 15 (ref 11–26)
BUN: 27 mg/dL (ref 10–36)
CO2: 22 mmol/L (ref 18–29)
Calcium: 10.2 mg/dL (ref 8.7–10.3)
Chloride: 96 mmol/L — ABNORMAL LOW (ref 97–108)
Creatinine, Ser: 1.79 mg/dL — ABNORMAL HIGH (ref 0.57–1.00)
GFR calc non Af Amer: 24 mL/min/{1.73_m2} — ABNORMAL LOW (ref >59)
GFR, EST AFRICAN AMERICAN: 28 mL/min/{1.73_m2} — AB (ref >59)
Glucose: 94 mg/dL (ref 65–99)
POTASSIUM: 5 mmol/L (ref 3.5–5.2)
Sodium: 138 mmol/L (ref 134–144)

## 2013-12-12 LAB — LIPID PANEL
CHOLESTEROL TOTAL: 230 mg/dL — AB (ref 100–199)
Chol/HDL Ratio: 2.5 ratio units (ref 0.0–4.4)
HDL: 93 mg/dL (ref >39)
LDL CALC: 114 mg/dL — AB (ref 0–99)
TRIGLYCERIDES: 116 mg/dL (ref 0–149)
VLDL Cholesterol Cal: 23 mg/dL (ref 5–40)

## 2013-12-12 LAB — HEPATIC FUNCTION PANEL
ALT: 9 IU/L (ref 0–32)
AST: 23 IU/L (ref 0–40)
Albumin: 4.2 g/dL (ref 3.2–4.6)
Alkaline Phosphatase: 59 IU/L (ref 39–117)
BILIRUBIN DIRECT: 0.07 mg/dL (ref 0.00–0.40)
TOTAL PROTEIN: 6.8 g/dL (ref 6.0–8.5)
Total Bilirubin: 0.3 mg/dL (ref 0.0–1.2)

## 2013-12-12 LAB — VITAMIN D 25 HYDROXY (VIT D DEFICIENCY, FRACTURES): Vit D, 25-Hydroxy: 70.1 ng/mL (ref 30.0–100.0)

## 2013-12-13 ENCOUNTER — Telehealth: Payer: Self-pay | Admitting: Family Medicine

## 2013-12-13 NOTE — Telephone Encounter (Signed)
Message copied by Doreatha Massed on Thu Dec 13, 2013  3:08 PM ------      Message from: Ernestina Penna      Created: Wed Dec 12, 2013  7:24 AM       Call her daughter, Delbert Phenix with the results of this lab work       The blood sugar is good at 94. The creatinine the most important kidney function test remains elevated but consistent and stable with past readings. The electrolytes including potassium are within normal limits except the chloride is slightly low and this is okay.      Liver function tests are within normal limit      The total cholesterol was elevated at 2:30 this is slightly decreased from what it was last checked. The LDL C. cholesterol remains elevated at 114 but also slightly decreased from 4 months ago. The triglycerides are good.------- continue to watch her diet and avoid fried foods and get as much exercise as possible. Also continue with the fish oil      The vitamin D level is excellent, continue current treatment ------

## 2013-12-18 ENCOUNTER — Encounter: Payer: Self-pay | Admitting: Family Medicine

## 2013-12-18 ENCOUNTER — Telehealth: Payer: Self-pay | Admitting: Family Medicine

## 2013-12-18 ENCOUNTER — Other Ambulatory Visit (INDEPENDENT_AMBULATORY_CARE_PROVIDER_SITE_OTHER): Payer: Medicare Other

## 2013-12-18 DIAGNOSIS — N390 Urinary tract infection, site not specified: Secondary | ICD-10-CM

## 2013-12-18 LAB — POCT UA - MICROSCOPIC ONLY
Bacteria, U Microscopic: NEGATIVE
Casts, Ur, LPF, POC: NEGATIVE
Crystals, Ur, HPF, POC: NEGATIVE
Yeast, UA: NEGATIVE

## 2013-12-18 LAB — POCT URINALYSIS DIPSTICK
BILIRUBIN UA: NEGATIVE
Blood, UA: NEGATIVE
Glucose, UA: NEGATIVE
KETONES UA: NEGATIVE
Nitrite, UA: NEGATIVE
PH UA: 5
Protein, UA: NEGATIVE
SPEC GRAV UA: 1.015
Urobilinogen, UA: NEGATIVE

## 2013-12-18 MED ORDER — SULFAMETHOXAZOLE-TMP DS 800-160 MG PO TABS: 1.0000 | ORAL_TABLET | Freq: Two times a day (BID) | ORAL | Status: DC

## 2013-12-18 NOTE — Addendum Note (Signed)
Addended by: Magdalene River on: 12/18/2013 04:41 PM   Modules accepted: Orders

## 2013-12-18 NOTE — Telephone Encounter (Signed)
Description has already been called in

## 2013-12-18 NOTE — Telephone Encounter (Signed)
See lab results.  

## 2013-12-18 NOTE — Progress Notes (Signed)
Pt came in for lab  only 

## 2013-12-19 LAB — URINE CULTURE: Organism ID, Bacteria: NO GROWTH

## 2013-12-19 NOTE — Telephone Encounter (Signed)
Called patient to make sure she knew this had been taking care of.

## 2013-12-24 ENCOUNTER — Encounter: Payer: Self-pay | Admitting: Family Medicine

## 2013-12-25 ENCOUNTER — Encounter: Payer: Self-pay | Admitting: Family Medicine

## 2013-12-25 ENCOUNTER — Ambulatory Visit (INDEPENDENT_AMBULATORY_CARE_PROVIDER_SITE_OTHER): Payer: Medicare Other | Admitting: Family Medicine

## 2013-12-25 VITALS — BP 122/71 | HR 79 | Temp 96.9°F | Ht 64.0 in | Wt 179.0 lb

## 2013-12-25 DIAGNOSIS — T783XXD Angioneurotic edema, subsequent encounter: Principal | ICD-10-CM

## 2013-12-25 DIAGNOSIS — T464X5A Adverse effect of angiotensin-converting-enzyme inhibitors, initial encounter: Secondary | ICD-10-CM | POA: Insufficient documentation

## 2013-12-25 DIAGNOSIS — T464X5D Adverse effect of angiotensin-converting-enzyme inhibitors, subsequent encounter: Secondary | ICD-10-CM

## 2013-12-25 DIAGNOSIS — I10 Essential (primary) hypertension: Secondary | ICD-10-CM

## 2013-12-25 DIAGNOSIS — T783XXA Angioneurotic edema, initial encounter: Secondary | ICD-10-CM

## 2013-12-25 DIAGNOSIS — E871 Hypo-osmolality and hyponatremia: Secondary | ICD-10-CM | POA: Insufficient documentation

## 2013-12-25 DIAGNOSIS — N390 Urinary tract infection, site not specified: Secondary | ICD-10-CM

## 2013-12-25 NOTE — Patient Instructions (Signed)
Monitor BP and weight twice a day for about 2 weeks and bring readings into [place]  Reduce lorazepam and gabapentin as directed After a few weeks - you can start to wean the sertraline.  Bring in a clean catch urine to be tested tomorrow if possible. We will call you with results- when available.

## 2013-12-25 NOTE — Progress Notes (Signed)
Subjective:    Patient ID: Daisy Scott, female    DOB: 05-09-1918, 80 y.o.   MRN: [id number]  HPI Patient here today follow up from ER visit at [facility]. She was there for possible allergic reaction and/or angioedema. The patient comes to the visit today with her caregiver and her daughter. The recent visit to the emergency room on 12/23/2013 had come on over a week's period of time. She was having some facial swelling and draping. She is also having weakness and some slurred speech. Subsequent findings did not reveal a CVA. She did have a CT scan which showed atrophy. She was somewhat hyper natriuretic. The ER physician felt that she was having some angioedema side effects from the lisinopril. Emergency department report from [facility] was reviewed in its entirety. Her CT scan revealed some brain atrophy. Also her Septra DS was discontinued as well as the lisinopril HCT. The daughter, who comes to the visit with her mom and caregiver today would like to see her mother come off of Zoloft, lorazepam, and gabapentin.        Patient Active Problem List   Diagnosis Date Noted  . Asthma, chronic 04/09/2013  . History of gout 04/09/2013  . Hypertension 10/31/2012  . Hyperlipemia 10/31/2012  . Hypothyroid 10/31/2012  . CKD (chronic kidney disease) 10/31/2012  . Vitamin D deficiency 10/31/2012  . History of asthma 10/31/2012   Outpatient Encounter Prescriptions as of 12/25/2013  Medication Sig  . albuterol (PROVENTIL HFA;VENTOLIN HFA) 108 (90 BASE) MCG/ACT inhaler Inhale 2 puffs into the lungs every 6 (six) hours as needed for wheezing.  Marland Kitchen allopurinol (ZYLOPRIM) 100 MG tablet TAKE TWO TABLETS BY MOUTH DAILY  . amLODipine (NORVASC) 5 MG tablet TAKE ONE (1) TABLET EACH DAY  . aspirin EC 81 MG tablet Take 81 mg by mouth daily.  . brinzolamide (AZOPT) 1 % ophthalmic suspension Place 1 drop into the right eye 2 (two) times daily.  . Calcium Carbonate-Vitamin D  (CALCIUM-D) 600-400 MG-UNIT TABS Take 1 tablet by mouth 2 (two) times daily.  . Cranberry 500 MG CAPS Take 1 capsule by mouth daily.  . diphenhydrAMINE (BENADRYL) 25 MG tablet Take 25 mg by mouth every 6 (six) hours as needed.  . fish oil-omega-3 fatty acids 1000 MG capsule Take 2 g by mouth 2 (two) times daily.  Marland Kitchen gabapentin (NEURONTIN) 100 MG capsule TAKE ONE CAPSULE BY MOUTH AT BEDTIME  . levothyroxine (SYNTHROID, LEVOTHROID) 75 MCG tablet TAKE ONE TABLET EVERY MORNING  . LORazepam (ATIVAN) 0.5 MG tablet Take 1 tablet (0.5 mg total) by mouth 2 (two) times daily. As directed  . Multiple Vitamin (MULTIVITAMIN WITH MINERALS) TABS tablet Take 1 tablet by mouth daily.  . pantoprazole (PROTONIX) 40 MG tablet Take 1 tablet (40 mg total) by mouth daily.  . ranitidine (ZANTAC) 150 MG tablet Take 150 mg by mouth 2 (two) times daily.  . salmeterol (SEREVENT) 50 MCG/DOSE diskus inhaler Inhale 1 puff into the lungs 2 (two) times daily.  . sertraline (ZOLOFT) 50 MG tablet TAKE 1/2 TABLET DAILY  . theophylline (THEODUR) 300 MG 12 hr tablet Take 0.5 tablets (150 mg total) by mouth 2 (two) times daily. 1/2 tablet BID  . travoprost, benzalkonium, (TRAVATAN) 0.004 % ophthalmic solution Place 1 drop into the right eye at bedtime.  . [DISCONTINUED] lisinopril-hydrochlorothiazide (PRINZIDE,ZESTORETIC) 20-12.5 MG per tablet TAKE ONE TABLET BY MOUTH TWICE DAILY  . [DISCONTINUED] sulfamethoxazole-trimethoprim (BACTRIM DS) 800-160 MG per tablet Take 1 tablet by mouth  2 (two) times daily.    Review of Systems  HENT: Negative.   Eyes: Negative.   Respiratory: Negative.   Cardiovascular: Positive for leg swelling (right ).  Gastrointestinal: Negative.   Endocrine: Negative.   Genitourinary: Negative.   Musculoskeletal: Negative.   Skin: Negative.   Allergic/Immunologic: Negative.   Neurological: Positive for weakness.  Hematological: Negative.   Psychiatric/Behavioral: Negative.        Objective:    Physical Exam  Nursing note and vitals reviewed. Constitutional: She is oriented to person, place, and time. She appears well-developed and well-nourished. No distress.  The patient looks good for her age. She is alert and smiling.  HENT:  Head: Normocephalic and atraumatic.  Eyes: Conjunctivae and EOM are normal. Pupils are equal, round, and reactive to light. Right eye exhibits no discharge. Left eye exhibits no discharge. No scleral icterus.  Neck: Normal range of motion. Neck supple. No thyromegaly present.  Cardiovascular: Normal rate, regular rhythm and normal heart sounds.   No murmur heard. At 60 per minute  Pulmonary/Chest: Effort normal and breath sounds normal. No respiratory distress. She has no wheezes. She has no rales. She exhibits no tenderness.  Abdominal: Soft. Bowel sounds are normal. She exhibits no mass. There is no tenderness. There is no rebound and no guarding.  Musculoskeletal: Normal range of motion. She exhibits no edema and no tenderness.  Minimal if any edema in the right lower extremity, most likely secondary to without with a superficial phlebitis many years ago  Lymphadenopathy:    She has no cervical adenopathy.  Neurological: She is alert and oriented to person, place, and time. She has normal reflexes.  Skin: Skin is warm and dry. No rash noted.  Psychiatric: She has a normal mood and affect. Her behavior is normal. Judgment and thought content normal.  The patient obviously has some mild dementia. The recent CT scan done indicated cerebral atrophy.   BP 122/71  Pulse 79  Temp(Src) 96.9 F (36.1 C) (Oral)  Ht _0  (1.626 m)  Wt 179 lb (81.194 kg)  BMI 30.71 kg/m2        Assessment & Plan:  1. Urinary tract infection without hematuria, site unspecified - POCT urinalysis dipstick; Future - POCT UA - Microscopic Only; Future - Urine culture; Future  2. Angiotensin converting enzyme inhibitor-aggravated angioedema, subsequent encounter  3.  Hyponatremia  4. Essential hypertension - BMP8+EGFR  Patient Instructions  Monitor BP and weight twice a day for about 2 weeks and bring readings into [place]  Reduce lorazepam and gabapentin as directed After a few weeks - you can start to wean the sertraline.  Bring in a clean catch urine to be tested tomorrow if possible. We will call you with results- when available.    Arrie Senate MD

## 2013-12-26 ENCOUNTER — Other Ambulatory Visit (INDEPENDENT_AMBULATORY_CARE_PROVIDER_SITE_OTHER): Payer: Medicare Other

## 2013-12-26 DIAGNOSIS — N390 Urinary tract infection, site not specified: Secondary | ICD-10-CM

## 2013-12-26 LAB — BMP8+EGFR
BUN/Creatinine Ratio: 14 (ref 11–26)
BUN: 32 mg/dL (ref 10–36)
CALCIUM: 10.2 mg/dL (ref 8.7–10.3)
CO2: 23 mmol/L (ref 18–29)
CREATININE: 2.24 mg/dL — AB (ref 0.57–1.00)
Chloride: 91 mmol/L — ABNORMAL LOW (ref 97–108)
GFR calc Af Amer: 21 mL/min/{1.73_m2} — ABNORMAL LOW (ref >59)
GFR calc non Af Amer: 18 mL/min/{1.73_m2} — ABNORMAL LOW (ref >59)
GLUCOSE: 103 mg/dL — AB (ref 65–99)
Potassium: 5.4 mmol/L — ABNORMAL HIGH (ref 3.5–5.2)
SODIUM: 132 mmol/L — AB (ref 134–144)

## 2013-12-26 LAB — POCT URINALYSIS DIPSTICK
Bilirubin, UA: NEGATIVE
Glucose, UA: NEGATIVE
KETONES UA: NEGATIVE
Nitrite, UA: NEGATIVE
SPEC GRAV UA: 1.01
Urobilinogen, UA: NEGATIVE
pH, UA: 6

## 2013-12-26 LAB — POCT UA - MICROSCOPIC ONLY
Casts, Ur, LPF, POC: NEGATIVE
Crystals, Ur, HPF, POC: NEGATIVE
MUCUS UA: NEGATIVE
YEAST UA: NEGATIVE

## 2013-12-27 ENCOUNTER — Ambulatory Visit: Payer: Medicare Other | Admitting: Family Medicine

## 2013-12-27 ENCOUNTER — Encounter: Payer: Self-pay | Admitting: Family Medicine

## 2013-12-27 NOTE — Progress Notes (Signed)
Daughter Britta Mccreedy aware of urine results. States patient has 2 more days of sulfa med to take. Will finish meds, wait on urine culture, then may repeat when comes for lab appt in 2wks

## 2013-12-28 ENCOUNTER — Encounter: Payer: Self-pay | Admitting: Family Medicine

## 2013-12-28 LAB — URINE CULTURE

## 2013-12-29 ENCOUNTER — Telehealth: Payer: Self-pay | Admitting: Family Medicine

## 2013-12-31 ENCOUNTER — Other Ambulatory Visit (INDEPENDENT_AMBULATORY_CARE_PROVIDER_SITE_OTHER): Payer: Medicare Other

## 2013-12-31 DIAGNOSIS — N390 Urinary tract infection, site not specified: Secondary | ICD-10-CM

## 2013-12-31 LAB — POCT UA - MICROSCOPIC ONLY
Bacteria, U Microscopic: NEGATIVE
Casts, Ur, LPF, POC: NEGATIVE
Crystals, Ur, HPF, POC: NEGATIVE
MUCUS UA: NEGATIVE
RBC, URINE, MICROSCOPIC: NEGATIVE
YEAST UA: NEGATIVE

## 2013-12-31 LAB — POCT URINALYSIS DIPSTICK
Bilirubin, UA: NEGATIVE
Blood, UA: NEGATIVE
Glucose, UA: NEGATIVE
KETONES UA: NEGATIVE
Nitrite, UA: NEGATIVE
PROTEIN UA: NEGATIVE
Spec Grav, UA: 1.01
Urobilinogen, UA: NEGATIVE
pH, UA: 6.5

## 2013-12-31 NOTE — Progress Notes (Signed)
Lab only 

## 2013-12-31 NOTE — Addendum Note (Signed)
Addended by: Orma Render F on: 12/31/2013 01:21 PM   Modules accepted: Orders

## 2013-12-31 NOTE — Telephone Encounter (Signed)
Please schedule this patient for a visit with gastroenterology as her most recent urine culture had no growth. She is complaining of weakness and poor appetite and GI discomfort

## 2014-01-01 LAB — URINE CULTURE: Organism ID, Bacteria: NO GROWTH

## 2014-01-01 NOTE — Telephone Encounter (Signed)
All symptoms of urine are better Stomach is better and she is not complaining now  Daisy Scott says that her only issue now is her not wanting to eat and maybe being a little depressed.\  They are getting jello, some ensure, applesauce in regularly.   Daisy Scott does not want pt to do any extra testing or extra appts/running - that she don't have to do  At this time they (family) will call us as needed.

## 2014-01-01 NOTE — Telephone Encounter (Signed)
Make sure that she takes the antibiotic with food and if she completes the course of antibiotic

## 2014-01-09 ENCOUNTER — Other Ambulatory Visit (INDEPENDENT_AMBULATORY_CARE_PROVIDER_SITE_OTHER): Payer: Medicare Other

## 2014-01-09 ENCOUNTER — Other Ambulatory Visit: Payer: Self-pay | Admitting: Family Medicine

## 2014-01-09 ENCOUNTER — Ambulatory Visit: Payer: Medicare Other | Admitting: Family Medicine

## 2014-01-09 DIAGNOSIS — E875 Hyperkalemia: Secondary | ICD-10-CM

## 2014-01-10 LAB — BMP8+EGFR
BUN / CREAT RATIO: 15 (ref 11–26)
BUN: 22 mg/dL (ref 10–36)
CO2: 23 mmol/L (ref 18–29)
Calcium: 9.9 mg/dL (ref 8.7–10.3)
Chloride: 97 mmol/L (ref 97–108)
Creatinine, Ser: 1.46 mg/dL — ABNORMAL HIGH (ref 0.57–1.00)
GFR calc non Af Amer: 31 mL/min/{1.73_m2} — ABNORMAL LOW (ref >59)
GFR, EST AFRICAN AMERICAN: 36 mL/min/{1.73_m2} — AB (ref >59)
GLUCOSE: 87 mg/dL (ref 65–99)
POTASSIUM: 4.7 mmol/L (ref 3.5–5.2)
Sodium: 137 mmol/L (ref 134–144)

## 2014-01-15 ENCOUNTER — Encounter: Payer: Self-pay | Admitting: Family Medicine

## 2014-01-25 ENCOUNTER — Ambulatory Visit: Payer: Medicare Other | Admitting: Family Medicine

## 2014-02-04 ENCOUNTER — Encounter: Payer: Self-pay | Admitting: Family Medicine

## 2014-02-04 ENCOUNTER — Ambulatory Visit (INDEPENDENT_AMBULATORY_CARE_PROVIDER_SITE_OTHER): Payer: Medicare Other | Admitting: Family Medicine

## 2014-02-04 VITALS — BP 133/78 | HR 101 | Temp 96.7°F | Ht 64.0 in | Wt 175.0 lb

## 2014-02-04 DIAGNOSIS — G3184 Mild cognitive impairment, so stated: Secondary | ICD-10-CM

## 2014-02-04 DIAGNOSIS — J4520 Mild intermittent asthma, uncomplicated: Secondary | ICD-10-CM

## 2014-02-04 DIAGNOSIS — I10 Essential (primary) hypertension: Secondary | ICD-10-CM

## 2014-02-04 DIAGNOSIS — N390 Urinary tract infection, site not specified: Secondary | ICD-10-CM

## 2014-02-04 MED ORDER — LORAZEPAM 0.5 MG PO TABS: 0.5000 mg | ORAL_TABLET | Freq: Two times a day (BID) | ORAL | Status: DC

## 2014-02-04 MED ORDER — ALLOPURINOL 100 MG PO TABS: ORAL_TABLET | ORAL | Status: DC

## 2014-02-04 MED ORDER — LEVOTHYROXINE SODIUM 75 MCG PO TABS: ORAL_TABLET | ORAL | Status: DC

## 2014-02-04 MED ORDER — THEOPHYLLINE ER 300 MG PO TB12: 150.0000 mg | ORAL_TABLET | Freq: Two times a day (BID) | ORAL | Status: DC

## 2014-02-04 MED ORDER — PANTOPRAZOLE SODIUM 40 MG PO TBEC: 40.0000 mg | DELAYED_RELEASE_TABLET | Freq: Every day | ORAL | Status: DC

## 2014-02-04 MED ORDER — SALMETEROL XINAFOATE 50 MCG/DOSE IN AEPB: INHALATION_SPRAY | RESPIRATORY_TRACT | Status: DC

## 2014-02-04 MED ORDER — SERTRALINE HCL 50 MG PO TABS: ORAL_TABLET | ORAL | Status: DC

## 2014-02-04 NOTE — Progress Notes (Signed)
Subjective:    Patient ID: Daisy Scott, female    DOB: [birth year], 80 y.o.   MRN: [id number]  HPI Patient here today for 1 month follow up on angioedema, uti, and not eating.   Her vital signs are stable. She still does not have a lot of appetite. She has a lot of dreams and anxiety. She has had urinary tract infections and a history of angioedema. Her appetite has improved according to the caregiver. She is in good spirits today and comes in using her walker.       Patient Active Problem List   Diagnosis Date Noted  . Angiotensin converting enzyme inhibitor-aggravated angioedema 12/25/2013  . Hyponatremia 12/25/2013  . Asthma, chronic 04/09/2013  . History of gout 04/09/2013  . Hypertension 10/31/2012  . Hyperlipemia 10/31/2012  . Hypothyroid 10/31/2012  . CKD (chronic kidney disease) 10/31/2012  . Vitamin D deficiency 10/31/2012  . History of asthma 10/31/2012   Outpatient Encounter Prescriptions as of 02/04/2014  Medication Sig  . albuterol (PROVENTIL HFA;VENTOLIN HFA) 108 (90 BASE) MCG/ACT inhaler Inhale 2 puffs into the lungs every 6 (six) hours as needed for wheezing.  Marland Kitchen. allopurinol (ZYLOPRIM) 100 MG tablet TAKE TWO TABLETS BY MOUTH DAILY  . amLODipine (NORVASC) 5 MG tablet TAKE ONE (1) TABLET EACH DAY  . aspirin EC 81 MG tablet Take 81 mg by mouth daily.  . brinzolamide (AZOPT) 1 % ophthalmic suspension Place 1 drop into the right eye 2 (two) times daily.  . Cranberry 500 MG CAPS Take 1 capsule by mouth daily.  Marland Kitchen. levothyroxine (SYNTHROID, LEVOTHROID) 75 MCG tablet TAKE ONE TABLET EVERY MORNING  . LORazepam (ATIVAN) 0.5 MG tablet Take 1 tablet (0.5 mg total) by mouth 2 (two) times daily. As directed  . Multiple Vitamin (MULTIVITAMIN WITH MINERALS) TABS tablet Take 1 tablet by mouth daily.  . pantoprazole (PROTONIX) 40 MG tablet Take 1 tablet (40 mg total) by mouth daily.  . SEREVENT DISKUS 50 MCG/DOSE diskus inhaler USE 1 INHALTAION TWICE DAILY  . sertraline  (ZOLOFT) 50 MG tablet TAKE 1/2 TABLET DAILY  . theophylline (THEODUR) 300 MG 12 hr tablet Take 0.5 tablets (150 mg total) by mouth 2 (two) times daily. 1/2 tablet BID  . travoprost, benzalkonium, (TRAVATAN) 0.004 % ophthalmic solution Place 1 drop into the right eye at bedtime.  . [DISCONTINUED] Calcium Carbonate-Vitamin D (CALCIUM-D) 600-400 MG-UNIT TABS Take 1 tablet by mouth 2 (two) times daily.  . [DISCONTINUED] diphenhydrAMINE (BENADRYL) 25 MG tablet Take 25 mg by mouth every 6 (six) hours as needed.  . [DISCONTINUED] fish oil-omega-3 fatty acids 1000 MG capsule Take 2 g by mouth 2 (two) times daily.  . [DISCONTINUED] gabapentin (NEURONTIN) 100 MG capsule TAKE ONE CAPSULE BY MOUTH AT BEDTIME  . [DISCONTINUED] ranitidine (ZANTAC) 150 MG tablet Take 150 mg by mouth 2 (two) times daily.    Review of Systems  Constitutional: Negative.  Appetite change: is better.  HENT: Negative.   Eyes: Negative.   Respiratory: Negative.   Cardiovascular: Negative.   Gastrointestinal: Negative.   Endocrine: Negative.   Genitourinary: Negative.   Musculoskeletal: Negative.   Skin: Negative.   Allergic/Immunologic: Negative.   Neurological: Negative.   Hematological: Negative.   Psychiatric/Behavioral: Negative.        Objective:   Physical Exam  Nursing note and vitals reviewed. Constitutional: She is oriented to person, place, and time. She appears well-developed and well-nourished. No distress.  The patient looks good and is doing well for her age of  80 years.  HENT:  Head: Normocephalic and atraumatic.  Right Ear: External ear normal.  Left Ear: External ear normal.  Nose: Nose normal.  Mouth/Throat: Oropharynx is clear and moist.  Eyes: Conjunctivae and EOM are normal. Pupils are equal, round, and reactive to light. Right eye exhibits no discharge. Left eye exhibits no discharge. No scleral icterus.  Neck: Normal range of motion. Neck supple. No thyromegaly present.  No carotid bruits or  thyromegaly  Cardiovascular: Normal rate, regular rhythm and normal heart sounds.  Exam reveals no gallop and no friction rub.   No murmur heard. The heart has a regular rate and rhythm at 84 per minute  Pulmonary/Chest: Effort normal and breath sounds normal. No respiratory distress. She has no wheezes. She has no rales. She exhibits no tenderness.  The patient has a dry cough, there is no wheezes or rales.  Abdominal: Soft. Bowel sounds are normal. She exhibits no mass. There is no tenderness. There is no rebound and no guarding.  Abdomen is obese without tenderness or masses  Musculoskeletal: Normal range of motion. She exhibits no edema and no tenderness.  The patient uses a walker for ambulation because of lumbar osteoarthritis  Lymphadenopathy:    She has no cervical adenopathy.  Neurological: She is alert and oriented to person, place, and time.  Skin: Skin is warm and dry. No rash noted.  Psychiatric: She has a normal mood and affect. Her behavior is normal. Judgment and thought content normal.    BP 133/78  Pulse 101  Temp(Src) 96.7 F (35.9 C) (Oral)  Ht 5\' 4"  (1.626 m)  Wt 175 lb (79.379 kg)  BMI 30.02 kg/m2       Assessment & Plan:  1. Urinary tract infection without hematuria, site unspecified  2. Essential hypertension  3. Asthma, chronic, mild intermittent, uncomplicated  4. Mild cognitive impairment with memory loss  Patient Instructions  Continue drinking plenty of fluids Return to clinic for flu shot per daughter's permission Return a clean-catch midstream urinalysis Continue to be careful and do not put yourself at risk for falling    Nyra Capeson W. Moore MD

## 2014-02-04 NOTE — Patient Instructions (Signed)
Continue drinking plenty of fluids Return to clinic for flu shot per daughter's permission Return a clean-catch midstream urinalysis Continue to be careful and do not put yourself at risk for falling

## 2014-02-14 ENCOUNTER — Other Ambulatory Visit: Payer: Medicare Other

## 2014-02-14 DIAGNOSIS — N342 Other urethritis: Secondary | ICD-10-CM

## 2014-02-16 ENCOUNTER — Telehealth: Payer: Self-pay | Admitting: *Deleted

## 2014-02-16 LAB — URINE CULTURE

## 2014-02-16 MED ORDER — AMPICILLIN 250 MG PO CAPS: 250.0000 mg | ORAL_CAPSULE | Freq: Four times a day (QID) | ORAL | Status: DC

## 2014-02-16 NOTE — Telephone Encounter (Signed)
Message copied by Tamera PuntWRAY, Suanne Minahan S on Sat Feb 16, 2014 12:14 PM ------      Message from: Ernestina PennaMOORE, DONALD W      Created: Sat Feb 16, 2014  9:47 AM       Please call the patient's daughter, Delbert PhenixBarbara Belton. The patient needs to be placed on penicillin, ampicillin 250 mg 4 times daily for 10 days recheck urinalysis in 2 weeks. Please call prescription in for ampicillin 250   4 times daily for 10 days ------

## 2014-02-18 ENCOUNTER — Telehealth: Payer: Self-pay | Admitting: Family Medicine

## 2014-02-18 MED ORDER — AMPICILLIN 250 MG PO CAPS: 250.0000 mg | ORAL_CAPSULE | Freq: Four times a day (QID) | ORAL | Status: DC

## 2014-02-18 NOTE — Telephone Encounter (Signed)
Canceled at [facility], [facility]

## 2014-03-20 ENCOUNTER — Encounter: Payer: Self-pay | Admitting: Family Medicine

## 2014-04-02 ENCOUNTER — Ambulatory Visit (INDEPENDENT_AMBULATORY_CARE_PROVIDER_SITE_OTHER): Payer: Medicare Other | Admitting: Family Medicine

## 2014-04-02 ENCOUNTER — Ambulatory Visit (HOSPITAL_COMMUNITY)
Admission: RE | Admit: 2014-04-02 | Discharge: 2014-04-02 | Disposition: A | Discharge status: Home / Self Care | Payer: Medicare Other | Source: Ambulatory Visit | Attending: Family Medicine | Admitting: Family Medicine

## 2014-04-02 ENCOUNTER — Encounter: Payer: Self-pay | Admitting: Family Medicine

## 2014-04-02 ENCOUNTER — Ambulatory Visit (INDEPENDENT_AMBULATORY_CARE_PROVIDER_SITE_OTHER): Payer: Medicare Other

## 2014-04-02 VITALS — BP 146/92 | HR 81 | Temp 96.7°F | Ht 64.0 in | Wt 174.0 lb

## 2014-04-02 DIAGNOSIS — W19XXXA Unspecified fall, initial encounter: Secondary | ICD-10-CM

## 2014-04-02 DIAGNOSIS — I10 Essential (primary) hypertension: Secondary | ICD-10-CM

## 2014-04-02 DIAGNOSIS — S0990XA Unspecified injury of head, initial encounter: Secondary | ICD-10-CM

## 2014-04-02 DIAGNOSIS — M25511 Pain in right shoulder: Secondary | ICD-10-CM

## 2014-04-02 DIAGNOSIS — S0591XA Unspecified injury of right eye and orbit, initial encounter: Secondary | ICD-10-CM | POA: Diagnosis not present

## 2014-04-02 DIAGNOSIS — G319 Degenerative disease of nervous system, unspecified: Secondary | ICD-10-CM | POA: Diagnosis not present

## 2014-04-02 DIAGNOSIS — E039 Hypothyroidism, unspecified: Secondary | ICD-10-CM

## 2014-04-02 DIAGNOSIS — E785 Hyperlipidemia, unspecified: Secondary | ICD-10-CM

## 2014-04-02 DIAGNOSIS — J4520 Mild intermittent asthma, uncomplicated: Secondary | ICD-10-CM

## 2014-04-02 DIAGNOSIS — E559 Vitamin D deficiency, unspecified: Secondary | ICD-10-CM

## 2014-04-02 LAB — POCT CBC
Granulocyte percent: 54.4 %G (ref 37–80)
HCT, POC: 43.3 % (ref 37.7–47.9)
HEMOGLOBIN: 14.2 g/dL (ref 12.2–16.2)
Lymph, poc: 3.7 — AB (ref 0.6–3.4)
MCH, POC: 31.6 pg — AB (ref 27–31.2)
MCHC: 32.7 g/dL (ref 31.8–35.4)
MCV: 96.6 fL (ref 80–97)
MPV: 8.1 fL (ref 0–99.8)
POC GRANULOCYTE: 5.4 (ref 2–6.9)
POC LYMPH PERCENT: 36.7 %L (ref 10–50)
Platelet Count, POC: 343 10*3/uL (ref 142–424)
RBC: 4.5 M/uL (ref 4.04–5.48)
RDW, POC: 16.3 %
WBC: 10 10*3/uL (ref 4.6–10.2)

## 2014-04-02 NOTE — Addendum Note (Signed)
Addended by: HANDY, ASHLEY N on: 04/02/2014 05:27 PM ° ° Modules accepted: Orders ° °

## 2014-04-02 NOTE — Progress Notes (Signed)
Subjective:    Patient ID: Daisy Scott, female    DOB: 07-20-1918, 80 y.o.   MRN: [id number]  HPI Pt here for follow up and management of chronic medical problems. The patient comes with her caregiver to the visit today. She continues to complain of arthralgias in both knees. When the patient went in the room today the caregiver was placing her pocketbook on the tabletop and the patient sat down on the side of the chair and fell against the wall with her glasses on and injured her orbit with an abrasion and there is a slight hematoma following that. The patient is alert and talkative and says her neck is a little bit sore following the fall and according to the sitter she is always has limited range of motion with putting on deodorant etc. of both shoulders.        Patient Active Problem List   Diagnosis Date Noted  . Angiotensin converting enzyme inhibitor-aggravated angioedema 12/25/2013  . Hyponatremia 12/25/2013  . Asthma, chronic 04/09/2013  . History of gout 04/09/2013  . Hypertension 10/31/2012  . Hyperlipemia 10/31/2012  . Hypothyroid 10/31/2012  . CKD (chronic kidney disease) 10/31/2012  . Vitamin D deficiency 10/31/2012  . History of asthma 10/31/2012   Outpatient Encounter Prescriptions as of 04/02/2014  Medication Sig  . albuterol (PROVENTIL HFA;VENTOLIN HFA) 108 (90 BASE) MCG/ACT inhaler Inhale 2 puffs into the lungs every 6 (six) hours as needed for wheezing.  Marland Kitchen allopurinol (ZYLOPRIM) 100 MG tablet TAKE TWO TABLETS BY MOUTH DAILY  . amLODipine (NORVASC) 5 MG tablet TAKE ONE (1) TABLET EACH DAY  . aspirin EC 81 MG tablet Take 81 mg by mouth daily.  . brinzolamide (AZOPT) 1 % ophthalmic suspension Place 1 drop into the right eye 2 (two) times daily.  . Cranberry 500 MG CAPS Take 1 capsule by mouth daily.  Marland Kitchen levothyroxine (SYNTHROID, LEVOTHROID) 75 MCG tablet TAKE ONE TABLET EVERY MORNING  . loratadine (CLARITIN) 10 MG tablet Take 10 mg by mouth daily.  Marland Kitchen  LORazepam (ATIVAN) 0.5 MG tablet Take 1 tablet (0.5 mg total) by mouth 2 (two) times daily. As directed  . Multiple Vitamin (MULTIVITAMIN WITH MINERALS) TABS tablet Take 1 tablet by mouth daily.  . pantoprazole (PROTONIX) 40 MG tablet Take 1 tablet (40 mg total) by mouth daily.  . salmeterol (SEREVENT DISKUS) 50 MCG/DOSE diskus inhaler USE 1 INHALTAION TWICE DAILY  . sertraline (ZOLOFT) 50 MG tablet TAKE 1/2 TABLET DAILY  . theophylline (THEODUR) 300 MG 12 hr tablet Take 0.5 tablets (150 mg total) by mouth 2 (two) times daily. 1/2 tablet BID  . travoprost, benzalkonium, (TRAVATAN) 0.004 % ophthalmic solution Place 1 drop into the right eye at bedtime.  . [DISCONTINUED] ampicillin (PRINCIPEN) 250 MG capsule Take 1 capsule (250 mg total) by mouth 4 (four) times daily.    Review of Systems  Constitutional: Negative.   HENT: Negative.   Eyes: Negative.   Respiratory: Negative.   Cardiovascular: Negative.   Gastrointestinal: Negative.   Endocrine: Negative.   Genitourinary: Negative.   Musculoskeletal: Positive for arthralgias (bilateral knee pain).  Skin: Negative.   Allergic/Immunologic: Negative.   Neurological: Negative.   Hematological: Negative.   Psychiatric/Behavioral: Negative.        Objective:   Physical Exam  Constitutional: She is oriented to person, place, and time. She appears well-developed and well-nourished. No distress.  The patient is alert and talkative and this was after her fall from going into the room. She has  a small tiny cut to the right upper lateral orbit which is no longer on. There may be some slight swelling to the right lateral shoulder and her range of motion of both shoulders is limited.  HENT:  Head: Normocephalic and atraumatic.  Right Ear: External ear normal.  Left Ear: External ear normal.  Nose: Nose normal.  Mouth/Throat: Oropharynx is clear and moist.  Eyes: Conjunctivae and EOM are normal. Pupils are equal, round, and reactive to light.  Right eye exhibits no discharge. Left eye exhibits no discharge. No scleral icterus.  Neck: Normal range of motion. Neck supple. No thyromegaly present.  She indicates that when she turns her head from the left to the right that the right side of her neck is somewhat sore. There is no hematoma obvious.  Cardiovascular: Normal rate, regular rhythm and normal heart sounds.   No murmur heard. The heart is regular at 72/m  Pulmonary/Chest: Effort normal and breath sounds normal. No respiratory distress. She has no wheezes. She has no rales. She exhibits no tenderness.  Abdominal: Soft. Bowel sounds are normal. She exhibits no mass. There is no tenderness. There is no rebound and no guarding.  Obesity without tenderness  Musculoskeletal: She exhibits no edema or tenderness.  Good hip movement stiff knee movement, but she is able to ambulate well with a walker. She is unable to fully abduct the right arm or the left arm. There may be a slight amount of swelling to the right lateral shoulder but there is no apparent hematoma.  Lymphadenopathy:    She has no cervical adenopathy.  Neurological: She is alert and oriented to person, place, and time.  Skin: Skin is warm and dry. No rash noted. No erythema. No pallor.  There is a slight laceration quarter of an inch in length that is very superficial to the right upper lateral orbit from the edge of her glasses from a fall.  Psychiatric: She has a normal mood and affect. Her behavior is normal. Judgment and thought content normal.  Nursing note and vitals reviewed.  Temp(Src) 96.7 F (35.9 C) (Oral)  Ht '5\' 4"'  (1.626 m)  Wt 174 lb (78.926 kg)  BMI 29.85 kg/m2  [facility] reading (PRIMARY) by  Dr.Moore-right shoulder-- no acute injury, osteopenic bones.                                       Assessment & Plan:  1. Essential hypertension - POCT CBC - BMP8+EGFR - Hepatic function panel  2. Asthma, chronic, mild intermittent, uncomplicated - POCT  CBC  3. Hyperlipemia - POCT CBC - Lipid panel  4. Vitamin D deficiency - POCT CBC - Vit D  25 hydroxy (rtn osteoporosis monitoring)  5. Hypothyroidism, unspecified hypothyroidism type - POCT CBC  6. Right shoulder pain - DG Shoulder Right; Future  7. Head injury, initial encounter - CT Head Wo Contrast; Future  8. Fall, initial encounter - CT Head Wo Contrast; Future - DG Shoulder Right; Future  Patient Instructions                       Medicare Annual Wellness Visit  [facility] and the medical providers at [facility] strive to bring you the best medical care.  In doing so we not only want to address your current medical conditions and concerns but also to detect new conditions early  and prevent illness, disease and health-related problems.    Medicare offers a yearly Wellness Visit which allows our clinical staff to assess your need for preventative services including immunizations, lifestyle education, counseling to decrease risk of preventable diseases and screening for fall risk and other medical concerns.    This visit is provided free of charge (no copay) for all Medicare recipients. The clinical pharmacists at [facility] have begun to conduct these Wellness Visits which will also include a thorough review of all your medications.    As you primary medical provider recommend that you make an appointment for your Annual Wellness Visit if you have not done so already this year.  You may set up this appointment before you leave today or you may call back ([phone]) and schedule an appointment.  Please make sure when you call that you mention that you are scheduling your Annual Wellness Visit with the clinical pharmacist so that the appointment may be made for the proper length of time.     Continue current medications. Continue good therapeutic lifestyle changes which include good diet and exercise. Fall precautions discussed  with patient. If an FOBT was given today- please return it to our front desk. If you are over 7 years old - you may need Prevnar 2 or the adult Pneumonia vaccine.  Flu Shots will be available at our office starting mid- September. Please call and schedule a FLU CLINIC APPOINTMENT.   Because of the fall in the office and not sitting on the chair correctly we will arrange for you to have a CT scan of your head and we will do x-rays of your right shoulder in the office. The CT scan will be arranged at the hospital. You will need to get your glasses readjusted Keep the small cut cleaned with alcohol or peroxide in the right upper lateral orbit. A head injury precaution sheet will be given to the patient and her caregiver Take Tylenol as needed for pain Application of ice over the right orbit and the right lateral shoulder off and on for the next 48 hours will help keep any swelling down to a minimum.   Head Injury You have received a head injury. It does not appear serious at this time. Headaches and vomiting are common following head injury. It should be easy to awaken from sleeping. Sometimes it is necessary for you to stay in the emergency department for a while for observation. Sometimes admission to the hospital may be needed. After injuries such as yours, most problems occur within the first 24 hours, but side effects may occur up to 7-10 days after the injury. It is important for you to carefully monitor your condition and contact your health care provider or seek immediate medical care if there is a change in your condition. WHAT ARE THE TYPES OF HEAD INJURIES? Head injuries can be as minor as a bump. Some head injuries can be more severe. More severe head injuries include:  A jarring injury to the brain (concussion).  A bruise of the brain (contusion). This mean there is bleeding in the brain that can cause swelling.  A cracked skull (skull fracture).  Bleeding in the brain that  collects, clots, and forms a bump (hematoma). WHAT CAUSES A HEAD INJURY? A serious head injury is most likely to happen to someone who is in a car wreck and is not wearing a seat belt. Other causes of major head injuries include bicycle or motorcycle accidents, sports  injuries, and falls. HOW ARE HEAD INJURIES DIAGNOSED? A complete history of the event leading to the injury and your current symptoms will be helpful in diagnosing head injuries. Many times, pictures of the brain, such as CT or MRI are needed to see the extent of the injury. Often, an overnight hospital stay is necessary for observation.  WHEN SHOULD I SEEK IMMEDIATE MEDICAL CARE?  You should get help right away if:  You have confusion or drowsiness.  You feel sick to your stomach (nauseous) or have continued, forceful vomiting.  You have dizziness or unsteadiness that is getting worse.  You have severe, continued headaches not relieved by medicine. Only take over-the-counter or prescription medicines for pain, fever, or discomfort as directed by your health care provider.  You do not have normal function of the arms or legs or are unable to walk.  You notice changes in the black spots in the center of the colored part of your eye (pupil).  You have a clear or bloody fluid coming from your nose or ears.  You have a loss of vision. During the next 24 hours after the injury, you must stay with someone who can watch you for the warning signs. This person should contact local emergency services (911 in the [place] if you have seizures, you become unconscious, or you are unable to wake up. HOW CAN I PREVENT A HEAD INJURY IN THE FUTURE? The most important factor for preventing major head injuries is avoiding motor vehicle accidents. To minimize the potential for damage to your head, it is crucial to wear seat belts while riding in motor vehicles. Wearing helmets while bike riding and playing collision sports (like football) is also  helpful. Also, avoiding dangerous activities around the house will further help reduce your risk of head injury.  WHEN CAN I RETURN TO NORMAL ACTIVITIES AND ATHLETICS? You should be reevaluated by your health care provider before returning to these activities. If you have any of the following symptoms, you should not return to activities or contact sports until 1 week after the symptoms have stopped:  Persistent headache.  Dizziness or vertigo.  Poor attention and concentration.  Confusion.  Memory problems.  Nausea or vomiting.  Fatigue or tire easily.  Irritability.  Intolerant of bright lights or loud noises.  Anxiety or depression.  Disturbed sleep. MAKE SURE YOU:   Understand these instructions.  Will watch your condition.  Will get help right away if you are not doing well or get worse. Document Released: 04/05/2005 Document Revised: 04/10/2013 Document Reviewed: 12/11/2012 [facility] Patient Information 2015 [place], [place]. This information is not intended to replace advice given to you by your health care provider. Make sure you discuss any questions you have with your health care provider.    Arrie Senate MD

## 2014-04-02 NOTE — Addendum Note (Signed)
Addended by: Tommas OlpHANDY, ASHLEY N on: 04/02/2014 05:27 PM   Modules accepted: Orders

## 2014-04-02 NOTE — Patient Instructions (Addendum)
Medicare Annual Wellness Visit  [facility] and the medical providers [facility] strive to bring you the best medical care.  In doing so we not only want to address your current medical conditions and concerns but also to detect new conditions early and prevent illness, disease and health-related problems.    Medicare offers a yearly Wellness Visit which allows our clinical staff to assess your need for preventative services including immunizations, lifestyle education, counseling to decrease risk of preventable diseases and screening for fall risk and other medical concerns.    This visit is provided free of charge (no copay) for all Medicare recipients. [facility] at [facility] have begun to conduct these Wellness Visits which will also include a thorough review of all your medications.    As you primary medical provider recommend that you make an appointment for your Annual Wellness Visit if you have not done so already this year.  You may set up this appointment before you leave today or you may call back [phone] and schedule an appointment.  Please make sure when you call that you mention that you are scheduling your Annual Wellness Visit with the clinical pharmacist so that the appointment may be made for the proper length of time.     Continue current medications. Continue good therapeutic lifestyle changes which include good diet and exercise. Fall precautions discussed with patient. If an FOBT was given today- please return it to our front desk. If you are over 395 years old - you may need Prevnar 13 or the adult Pneumonia vaccine.  Flu Shots will be available at our office starting mid- September. Please call and schedule a FLU CLINIC APPOINTMENT.   Because of the fall in the office and not sitting on the chair correctly we will arrange for you to have a CT scan of your head and we will do x-rays of  your right shoulder in the office. The CT scan will be arranged at the hospital. You will need to get your glasses readjusted Keep the small cut cleaned with alcohol or peroxide in the right upper lateral orbit. A head injury precaution sheet will be given to the patient and her caregiver Take Tylenol as needed for pain Application of ice over the right orbit and the right lateral shoulder off and on for the next 48 hours will help keep any swelling down to a minimum.   Head Injury You have received a head injury. It does not appear serious at this time. Headaches and vomiting are common following head injury. It should be easy to awaken from sleeping. Sometimes it is necessary for you to stay in the emergency department for a while for observation. Sometimes admission to the hospital may be needed. After injuries such as yours, most problems occur within the first 24 hours, but side effects may occur up to 7-10 days after the injury. It is important for you to carefully monitor your condition and contact your health care provider or seek immediate medical care if there is a change in your condition. WHAT ARE THE TYPES OF HEAD INJURIES? Head injuries can be as minor as a bump. Some head injuries can be more severe. More severe head injuries include:  A jarring injury to the brain (concussion).  A bruise of the brain (contusion). This mean there is bleeding in the brain that can cause swelling.  A cracked skull (skull fracture).  Bleeding in the brain that collects, clots,  and forms a bump (hematoma). WHAT CAUSES A HEAD INJURY? A serious head injury is most likely to happen to someone who is in a car wreck and is not wearing a seat belt. Other causes of major head injuries include bicycle or motorcycle accidents, sports injuries, and falls. HOW ARE HEAD INJURIES DIAGNOSED? A complete history of the event leading to the injury and your current symptoms will be helpful in diagnosing head  injuries. Many times, pictures of the brain, such as CT or MRI are needed to see the extent of the injury. Often, an overnight hospital stay is necessary for observation.  WHEN SHOULD I SEEK IMMEDIATE MEDICAL CARE?  You should get help right away if:  You have confusion or drowsiness.  You feel sick to your stomach (nauseous) or have continued, forceful vomiting.  You have dizziness or unsteadiness that is getting worse.  You have severe, continued headaches not relieved by medicine. Only take over-the-counter or prescription medicines for pain, fever, or discomfort as directed by your health care provider.  You do not have normal function of the arms or legs or are unable to walk.  You notice changes in the black spots in the center of the colored part of your eye (pupil).  You have a clear or bloody fluid coming from your nose or ears.  You have a loss of vision. During the next 24 hours after the injury, you must stay with someone who can watch you for the warning signs. This person should contact local emergency services (911 in the [place] if you have seizures, you become unconscious, or you are unable to wake up. HOW CAN I PREVENT A HEAD INJURY IN THE FUTURE? The most important factor for preventing major head injuries is avoiding motor vehicle accidents. To minimize the potential for damage to your head, it is crucial to wear seat belts while riding in motor vehicles. Wearing helmets while bike riding and playing collision sports (like football) is also helpful. Also, avoiding dangerous activities around the house will further help reduce your risk of head injury.  WHEN CAN I RETURN TO NORMAL ACTIVITIES AND ATHLETICS? You should be reevaluated by your health care provider before returning to these activities. If you have any of the following symptoms, you should not return to activities or contact sports until 1 week after the symptoms have stopped:  Persistent headache.  Dizziness  or vertigo.  Poor attention and concentration.  Confusion.  Memory problems.  Nausea or vomiting.  Fatigue or tire easily.  Irritability.  Intolerant of bright lights or loud noises.  Anxiety or depression.  Disturbed sleep. MAKE SURE YOU:   Understand these instructions.  Will watch your condition.  Will get help right away if you are not doing well or get worse. Document Released: 04/05/2005 Document Revised: 04/10/2013 Document Reviewed: 12/11/2012 [facility] Patient Information 2015 [place]. This information is not intended to replace advice given to you by your health care provider. Make sure you discuss any questions you have with your health care provider.

## 2014-04-03 ENCOUNTER — Telehealth: Payer: Self-pay | Admitting: Family Medicine

## 2014-04-03 LAB — BMP8+EGFR
BUN / CREAT RATIO: 11 (ref 11–26)
BUN: 14 mg/dL (ref 10–36)
CHLORIDE: 99 mmol/L (ref 97–108)
CO2: 26 mmol/L (ref 18–29)
Calcium: 10.1 mg/dL (ref 8.7–10.3)
Creatinine, Ser: 1.28 mg/dL — ABNORMAL HIGH (ref 0.57–1.00)
GFR calc Af Amer: 42 mL/min/{1.73_m2} — ABNORMAL LOW (ref >59)
GFR calc non Af Amer: 36 mL/min/{1.73_m2} — ABNORMAL LOW (ref >59)
GLUCOSE: 87 mg/dL (ref 65–99)
POTASSIUM: 4.3 mmol/L (ref 3.5–5.2)
Sodium: 142 mmol/L (ref 134–144)

## 2014-04-03 LAB — LIPID PANEL
CHOL/HDL RATIO: 2.6 ratio (ref 0.0–4.4)
CHOLESTEROL TOTAL: 259 mg/dL — AB (ref 100–199)
HDL: 98 mg/dL (ref >39)
LDL Calculated: 133 mg/dL — ABNORMAL HIGH (ref 0–99)
TRIGLYCERIDES: 138 mg/dL (ref 0–149)
VLDL Cholesterol Cal: 28 mg/dL (ref 5–40)

## 2014-04-03 LAB — HEPATIC FUNCTION PANEL
ALK PHOS: 68 IU/L (ref 39–117)
ALT: 10 IU/L (ref 0–32)
AST: 27 IU/L (ref 0–40)
Albumin: 4.3 g/dL (ref 3.2–4.6)
BILIRUBIN DIRECT: 0.11 mg/dL (ref 0.00–0.40)
TOTAL PROTEIN: 7.2 g/dL (ref 6.0–8.5)
Total Bilirubin: 0.4 mg/dL (ref 0.0–1.2)

## 2014-04-03 LAB — VITAMIN D 25 HYDROXY (VIT D DEFICIENCY, FRACTURES): Vit D, 25-Hydroxy: 54.9 ng/mL (ref 30.0–100.0)

## 2014-04-03 NOTE — Telephone Encounter (Signed)
-----   Message from Ernestina Pennaonald W Moore, MD sent at 04/03/2014  7:56 AM EST ----- The blood sugar is good at 87. The creatinine, the most important kidney function test remains elevated but is actually lower than it has been in the past which is good. The electrolytes including potassium are good. All liver function tests are within normal limits A restaurant numbers with traditional lipid testing have a total cholesterol that is elevated at 259. The triglycerides are good. The LDL C is elevated at 133 and should be less than 100. Encourage aggressive therapeutic lifestyle changes which include diet and exercise no treatment is necessary at this time. The vitamin D level is good at 54.9. Continue current treatment

## 2014-04-04 NOTE — Telephone Encounter (Signed)
Patient aware.

## 2014-05-23 ENCOUNTER — Telehealth: Payer: Self-pay | Admitting: Family Medicine

## 2014-05-23 ENCOUNTER — Encounter: Payer: Self-pay | Admitting: Family Medicine

## 2014-05-23 MED ORDER — AZITHROMYCIN 250 MG PO TABS: ORAL_TABLET | ORAL | Status: DC

## 2014-05-23 NOTE — Telephone Encounter (Signed)
Dr Christell Constantmoore approved med [facility]

## 2014-05-23 NOTE — Telephone Encounter (Signed)
Patient daughter aware that Tussin and the antibiotic will not interfere with each other.

## 2014-05-27 ENCOUNTER — Ambulatory Visit (INDEPENDENT_AMBULATORY_CARE_PROVIDER_SITE_OTHER): Payer: Medicare Other

## 2014-05-27 ENCOUNTER — Encounter: Payer: Self-pay | Admitting: Family Medicine

## 2014-05-27 ENCOUNTER — Inpatient Hospital Stay (HOSPITAL_COMMUNITY)
Admission: AD | Admit: 2014-05-27 | Discharge: 2014-06-08 | DRG: 193 | Disposition: A | Discharge status: Skilled Nursing Facility | Payer: Medicare Other | Source: Ambulatory Visit | Attending: Family Medicine | Admitting: Family Medicine

## 2014-05-27 ENCOUNTER — Telehealth: Payer: Self-pay | Admitting: Family Medicine

## 2014-05-27 ENCOUNTER — Ambulatory Visit (INDEPENDENT_AMBULATORY_CARE_PROVIDER_SITE_OTHER): Payer: Medicare Other | Admitting: Family Medicine

## 2014-05-27 ENCOUNTER — Encounter (HOSPITAL_COMMUNITY): Payer: Self-pay | Admitting: *Deleted

## 2014-05-27 VITALS — BP 132/83 | HR 59 | Temp 98.1°F | Ht 64.0 in | Wt 171.0 lb

## 2014-05-27 DIAGNOSIS — Z66 Do not resuscitate: Secondary | ICD-10-CM | POA: Diagnosis present

## 2014-05-27 DIAGNOSIS — I255 Ischemic cardiomyopathy: Secondary | ICD-10-CM | POA: Diagnosis present

## 2014-05-27 DIAGNOSIS — I509 Heart failure, unspecified: Secondary | ICD-10-CM

## 2014-05-27 DIAGNOSIS — I4891 Unspecified atrial fibrillation: Secondary | ICD-10-CM | POA: Diagnosis present

## 2014-05-27 DIAGNOSIS — I481 Persistent atrial fibrillation: Secondary | ICD-10-CM | POA: Diagnosis present

## 2014-05-27 DIAGNOSIS — R0600 Dyspnea, unspecified: Secondary | ICD-10-CM | POA: Diagnosis not present

## 2014-05-27 DIAGNOSIS — I5189 Other ill-defined heart diseases: Secondary | ICD-10-CM | POA: Insufficient documentation

## 2014-05-27 DIAGNOSIS — B370 Candidal stomatitis: Secondary | ICD-10-CM | POA: Diagnosis present

## 2014-05-27 DIAGNOSIS — J441 Chronic obstructive pulmonary disease with (acute) exacerbation: Secondary | ICD-10-CM | POA: Diagnosis present

## 2014-05-27 DIAGNOSIS — J181 Lobar pneumonia, unspecified organism: Principal | ICD-10-CM

## 2014-05-27 DIAGNOSIS — M109 Gout, unspecified: Secondary | ICD-10-CM | POA: Diagnosis present

## 2014-05-27 DIAGNOSIS — I129 Hypertensive chronic kidney disease with stage 1 through stage 4 chronic kidney disease, or unspecified chronic kidney disease: Secondary | ICD-10-CM | POA: Diagnosis present

## 2014-05-27 DIAGNOSIS — Z6830 Body mass index (BMI) 30.0-30.9, adult: Secondary | ICD-10-CM | POA: Diagnosis not present

## 2014-05-27 DIAGNOSIS — I248 Other forms of acute ischemic heart disease: Secondary | ICD-10-CM | POA: Diagnosis present

## 2014-05-27 DIAGNOSIS — E785 Hyperlipidemia, unspecified: Secondary | ICD-10-CM | POA: Diagnosis present

## 2014-05-27 DIAGNOSIS — N183 Chronic kidney disease, stage 3 unspecified: Secondary | ICD-10-CM | POA: Diagnosis present

## 2014-05-27 DIAGNOSIS — R059 Cough, unspecified: Secondary | ICD-10-CM

## 2014-05-27 DIAGNOSIS — R05 Cough: Secondary | ICD-10-CM | POA: Diagnosis present

## 2014-05-27 DIAGNOSIS — J189 Pneumonia, unspecified organism: Secondary | ICD-10-CM | POA: Diagnosis present

## 2014-05-27 DIAGNOSIS — Z86711 Personal history of pulmonary embolism: Secondary | ICD-10-CM | POA: Diagnosis not present

## 2014-05-27 DIAGNOSIS — I5023 Acute on chronic systolic (congestive) heart failure: Secondary | ICD-10-CM | POA: Diagnosis present

## 2014-05-27 DIAGNOSIS — J440 Chronic obstructive pulmonary disease with acute lower respiratory infection: Secondary | ICD-10-CM | POA: Diagnosis present

## 2014-05-27 DIAGNOSIS — E871 Hypo-osmolality and hyponatremia: Secondary | ICD-10-CM | POA: Diagnosis present

## 2014-05-27 DIAGNOSIS — E43 Unspecified severe protein-calorie malnutrition: Secondary | ICD-10-CM | POA: Diagnosis present

## 2014-05-27 DIAGNOSIS — E876 Hypokalemia: Secondary | ICD-10-CM | POA: Diagnosis present

## 2014-05-27 DIAGNOSIS — J9600 Acute respiratory failure, unspecified whether with hypoxia or hypercapnia: Secondary | ICD-10-CM | POA: Diagnosis present

## 2014-05-27 DIAGNOSIS — J45909 Unspecified asthma, uncomplicated: Secondary | ICD-10-CM | POA: Diagnosis present

## 2014-05-27 DIAGNOSIS — R5381 Other malaise: Secondary | ICD-10-CM

## 2014-05-27 DIAGNOSIS — D649 Anemia, unspecified: Secondary | ICD-10-CM | POA: Diagnosis present

## 2014-05-27 DIAGNOSIS — R0602 Shortness of breath: Secondary | ICD-10-CM

## 2014-05-27 DIAGNOSIS — J9601 Acute respiratory failure with hypoxia: Secondary | ICD-10-CM | POA: Diagnosis present

## 2014-05-27 DIAGNOSIS — I519 Heart disease, unspecified: Secondary | ICD-10-CM | POA: Insufficient documentation

## 2014-05-27 DIAGNOSIS — J209 Acute bronchitis, unspecified: Secondary | ICD-10-CM | POA: Diagnosis present

## 2014-05-27 DIAGNOSIS — N179 Acute kidney failure, unspecified: Secondary | ICD-10-CM | POA: Diagnosis present

## 2014-05-27 DIAGNOSIS — E039 Hypothyroidism, unspecified: Secondary | ICD-10-CM | POA: Diagnosis present

## 2014-05-27 DIAGNOSIS — Z7982 Long term (current) use of aspirin: Secondary | ICD-10-CM | POA: Diagnosis not present

## 2014-05-27 DIAGNOSIS — R609 Edema, unspecified: Secondary | ICD-10-CM

## 2014-05-27 DIAGNOSIS — R0902 Hypoxemia: Secondary | ICD-10-CM | POA: Diagnosis present

## 2014-05-27 DIAGNOSIS — I429 Cardiomyopathy, unspecified: Secondary | ICD-10-CM

## 2014-05-27 DIAGNOSIS — I700 Atherosclerosis of aorta: Secondary | ICD-10-CM

## 2014-05-27 DIAGNOSIS — J45901 Unspecified asthma with (acute) exacerbation: Secondary | ICD-10-CM | POA: Diagnosis present

## 2014-05-27 DIAGNOSIS — R062 Wheezing: Secondary | ICD-10-CM

## 2014-05-27 DIAGNOSIS — Z86718 Personal history of other venous thrombosis and embolism: Secondary | ICD-10-CM

## 2014-05-27 DIAGNOSIS — J4520 Mild intermittent asthma, uncomplicated: Secondary | ICD-10-CM

## 2014-05-27 DIAGNOSIS — Z8249 Family history of ischemic heart disease and other diseases of the circulatory system: Secondary | ICD-10-CM

## 2014-05-27 HISTORY — DX: Unspecified severe protein-calorie malnutrition: E43

## 2014-05-27 HISTORY — DX: Thoracic aortic ectasia: I77.810

## 2014-05-27 HISTORY — DX: Hypothyroidism, unspecified: E03.9

## 2014-05-27 HISTORY — DX: Chronic kidney disease, stage 3 unspecified: N18.30

## 2014-05-27 HISTORY — DX: Unspecified atrial fibrillation: I48.91

## 2014-05-27 HISTORY — DX: Essential (primary) hypertension: I10

## 2014-05-27 HISTORY — DX: Chronic kidney disease, stage 3 (moderate): N18.3

## 2014-05-27 LAB — URINALYSIS, ROUTINE W REFLEX MICROSCOPIC
Bilirubin Urine: NEGATIVE
GLUCOSE, UA: NEGATIVE mg/dL
KETONES UR: NEGATIVE mg/dL
NITRITE: NEGATIVE
Protein, ur: 100 mg/dL — AB
Specific Gravity, Urine: 1.02 (ref 1.005–1.030)
UROBILINOGEN UA: 0.2 mg/dL (ref 0.0–1.0)
pH: 5.5 (ref 5.0–8.0)

## 2014-05-27 LAB — BASIC METABOLIC PANEL
Anion gap: 8 (ref 5–15)
BUN: 17 mg/dL (ref 6–23)
CALCIUM: 9 mg/dL (ref 8.4–10.5)
CO2: 24 mmol/L (ref 19–32)
Chloride: 104 mmol/L (ref 96–112)
Creatinine, Ser: 1.3 mg/dL — ABNORMAL HIGH (ref 0.50–1.10)
GFR calc Af Amer: 40 mL/min — ABNORMAL LOW (ref >90)
GFR, EST NON AFRICAN AMERICAN: 34 mL/min — AB (ref >90)
GLUCOSE: 108 mg/dL — AB (ref 70–99)
Potassium: 3.6 mmol/L (ref 3.5–5.1)
Sodium: 136 mmol/L (ref 135–145)

## 2014-05-27 LAB — EXPECTORATED SPUTUM ASSESSMENT W REFEX TO RESP CULTURE

## 2014-05-27 LAB — POCT CBC
Granulocyte percent: 62.7 %G (ref 37–80)
HCT, POC: 43.9 % (ref 37.7–47.9)
Hemoglobin: 12.2 g/dL (ref 12.2–16.2)
Lymph, poc: 2.2 (ref 0.6–3.4)
MCH: 26.4 pg — AB (ref 27–31.2)
MCHC: 27.7 g/dL — AB (ref 31.8–35.4)
MCV: 95.2 fL (ref 80–97)
MPV: 7.3 fL (ref 0–99.8)
POC Granulocyte: 4.4 (ref 2–6.9)
POC LYMPH PERCENT: 30.8 %L (ref 10–50)
Platelet Count, POC: 244 10*3/uL (ref 142–424)
RBC: 4.6 M/uL (ref 4.04–5.48)
RDW, POC: 16.5 %
WBC: 7 10*3/uL (ref 4.6–10.2)

## 2014-05-27 LAB — EXPECTORATED SPUTUM ASSESSMENT W GRAM STAIN, RFLX TO RESP C

## 2014-05-27 LAB — URINE MICROSCOPIC-ADD ON

## 2014-05-27 MED ORDER — CEFTRIAXONE SODIUM IN DEXTROSE 20 MG/ML IV SOLN
1.0000 g | INTRAVENOUS | Status: DC
  Administered 2014-05-27 – 2014-05-29 (×3): 1 g via INTRAVENOUS
  Filled 2014-05-27 (×3): qty 50

## 2014-05-27 MED ORDER — SERTRALINE HCL 50 MG PO TABS
25.0000 mg | ORAL_TABLET | Freq: Every day | ORAL | Status: DC
  Administered 2014-05-27 – 2014-05-31 (×5): 25 mg via ORAL
  Filled 2014-05-27 (×6): qty 1

## 2014-05-27 MED ORDER — ACETAMINOPHEN 325 MG PO TABS
650.0000 mg | ORAL_TABLET | Freq: Four times a day (QID) | ORAL | Status: DC | PRN
  Administered 2014-05-31 – 2014-06-03 (×2): 650 mg via ORAL
  Filled 2014-05-27 (×2): qty 2

## 2014-05-27 MED ORDER — LORATADINE 10 MG PO TABS
10.0000 mg | ORAL_TABLET | Freq: Every day | ORAL | Status: DC
  Administered 2014-05-27 – 2014-06-08 (×13): 10 mg via ORAL
  Filled 2014-05-27 (×14): qty 1

## 2014-05-27 MED ORDER — BUDESONIDE-FORMOTEROL FUMARATE 160-4.5 MCG/ACT IN AERO
2.0000 | INHALATION_SPRAY | Freq: Two times a day (BID) | RESPIRATORY_TRACT | Status: DC
  Administered 2014-05-27 – 2014-06-08 (×16): 2 via RESPIRATORY_TRACT
  Filled 2014-05-27: qty 6

## 2014-05-27 MED ORDER — IPRATROPIUM-ALBUTEROL 0.5-2.5 (3) MG/3ML IN SOLN
3.0000 mL | RESPIRATORY_TRACT | Status: DC
  Administered 2014-05-27 – 2014-05-29 (×10): 3 mL via RESPIRATORY_TRACT
  Filled 2014-05-27 (×10): qty 3

## 2014-05-27 MED ORDER — ASPIRIN EC 81 MG PO TBEC
81.0000 mg | DELAYED_RELEASE_TABLET | Freq: Every day | ORAL | Status: DC
  Administered 2014-05-27 – 2014-06-08 (×13): 81 mg via ORAL
  Filled 2014-05-27 (×14): qty 1

## 2014-05-27 MED ORDER — BRINZOLAMIDE 1 % OP SUSP
1.0000 [drp] | OPHTHALMIC | Status: DC
  Administered 2014-05-28 – 2014-06-08 (×22): 1 [drp] via OPHTHALMIC
  Filled 2014-05-27 (×2): qty 10

## 2014-05-27 MED ORDER — LATANOPROST 0.005 % OP SOLN
1.0000 [drp] | Freq: Every day | OPHTHALMIC | Status: DC
  Administered 2014-05-27 – 2014-06-07 (×12): 1 [drp] via OPHTHALMIC
  Filled 2014-05-27: qty 2.5

## 2014-05-27 MED ORDER — ACETAMINOPHEN 650 MG RE SUPP: 650.0000 mg | Freq: Four times a day (QID) | RECTAL | Status: DC | PRN

## 2014-05-27 MED ORDER — DEXTROSE 5 % IV SOLN
500.0000 mg | INTRAVENOUS | Status: DC
  Administered 2014-05-27 – 2014-05-28 (×2): 500 mg via INTRAVENOUS
  Filled 2014-05-27 (×3): qty 500

## 2014-05-27 MED ORDER — PANTOPRAZOLE SODIUM 40 MG PO TBEC
40.0000 mg | DELAYED_RELEASE_TABLET | Freq: Every day | ORAL | Status: DC
  Administered 2014-05-27 – 2014-06-08 (×13): 40 mg via ORAL
  Filled 2014-05-27 (×14): qty 1

## 2014-05-27 MED ORDER — METHYLPREDNISOLONE SODIUM SUCC 125 MG IJ SOLR
60.0000 mg | Freq: Four times a day (QID) | INTRAMUSCULAR | Status: DC
  Administered 2014-05-27 – 2014-05-29 (×8): 60 mg via INTRAVENOUS
  Filled 2014-05-27 (×8): qty 2

## 2014-05-27 MED ORDER — ENOXAPARIN SODIUM 40 MG/0.4ML ~~LOC~~ SOLN
40.0000 mg | SUBCUTANEOUS | Status: DC
  Administered 2014-05-27: 40 mg via SUBCUTANEOUS
  Filled 2014-05-27: qty 0.4

## 2014-05-27 MED ORDER — ONDANSETRON HCL 4 MG/2ML IJ SOLN
4.0000 mg | Freq: Four times a day (QID) | INTRAMUSCULAR | Status: DC | PRN
  Administered 2014-05-28: 4 mg via INTRAVENOUS
  Filled 2014-05-27: qty 2

## 2014-05-27 MED ORDER — AMLODIPINE BESYLATE 5 MG PO TABS
5.0000 mg | ORAL_TABLET | Freq: Every day | ORAL | Status: DC
  Administered 2014-05-28: 5 mg via ORAL
  Filled 2014-05-27: qty 1

## 2014-05-27 MED ORDER — LORAZEPAM 0.5 MG PO TABS
0.5000 mg | ORAL_TABLET | Freq: Every day | ORAL | Status: DC
  Administered 2014-05-27 – 2014-06-07 (×13): 0.5 mg via ORAL
  Filled 2014-05-27 (×13): qty 1

## 2014-05-27 MED ORDER — ALBUTEROL SULFATE (2.5 MG/3ML) 0.083% IN NEBU: 2.5000 mg | INHALATION_SOLUTION | RESPIRATORY_TRACT | Status: DC | PRN

## 2014-05-27 MED ORDER — LEVOTHYROXINE SODIUM 75 MCG PO TABS
75.0000 ug | ORAL_TABLET | Freq: Every day | ORAL | Status: DC
  Administered 2014-05-28 – 2014-06-08 (×12): 75 ug via ORAL
  Filled 2014-05-27 (×12): qty 1

## 2014-05-27 MED ORDER — ONDANSETRON HCL 4 MG PO TABS: 4.0000 mg | ORAL_TABLET | Freq: Four times a day (QID) | ORAL | Status: DC | PRN

## 2014-05-27 NOTE — Progress Notes (Signed)
Subjective:    Patient ID: Daisy Scott, female    DOB: [birth year], 81 y.o.   MRN: [id number]  HPI Patient here today for cough and congestion. Her family phoned into the office last week about her symptoms and she was started on a Zpack on Friday. Over the weekend, her family feels that she has worsened. She is accompanied today by her daughter Daisy Scott and a caregiver.        Patient Active Problem List   Diagnosis Date Noted  . Angiotensin converting enzyme inhibitor-aggravated angioedema 12/25/2013  . Hyponatremia 12/25/2013  . Asthma, chronic 04/09/2013  . History of gout 04/09/2013  . Hypertension 10/31/2012  . Hyperlipemia 10/31/2012  . Hypothyroid 10/31/2012  . CKD (chronic kidney disease) 10/31/2012  . Vitamin D deficiency 10/31/2012  . History of asthma 10/31/2012   Outpatient Encounter Prescriptions as of 05/27/2014  Medication Sig  . albuterol (PROVENTIL HFA;VENTOLIN HFA) 108 (90 BASE) MCG/ACT inhaler Inhale 2 puffs into the lungs every 6 (six) hours as needed for wheezing.  Marland Kitchen allopurinol (ZYLOPRIM) 100 MG tablet TAKE TWO TABLETS BY MOUTH DAILY  . amLODipine (NORVASC) 5 MG tablet TAKE ONE (1) TABLET EACH DAY  . aspirin EC 81 MG tablet Take 81 mg by mouth daily.  Marland Kitchen azithromycin (ZITHROMAX) 250 MG tablet As directed  . brinzolamide (AZOPT) 1 % ophthalmic suspension Place 1 drop into the right eye 2 (two) times daily.  . Cranberry 500 MG CAPS Take 1 capsule by mouth daily.  Marland Kitchen levothyroxine (SYNTHROID, LEVOTHROID) 75 MCG tablet TAKE ONE TABLET EVERY MORNING  . loratadine (CLARITIN) 10 MG tablet Take 10 mg by mouth daily.  Marland Kitchen LORazepam (ATIVAN) 0.5 MG tablet Take 1 tablet (0.5 mg total) by mouth 2 (two) times daily. As directed  . Multiple Vitamin (MULTIVITAMIN WITH MINERALS) TABS tablet Take 1 tablet by mouth daily.  . pantoprazole (PROTONIX) 40 MG tablet Take 1 tablet (40 mg total) by mouth daily.  . salmeterol (SEREVENT DISKUS) 50 MCG/DOSE diskus inhaler USE 1  INHALTAION TWICE DAILY  . sertraline (ZOLOFT) 50 MG tablet TAKE 1/2 TABLET DAILY  . theophylline (THEODUR) 300 MG 12 hr tablet Take 0.5 tablets (150 mg total) by mouth 2 (two) times daily. 1/2 tablet BID  . travoprost, benzalkonium, (TRAVATAN) 0.004 % ophthalmic solution Place 1 drop into the right eye at bedtime.    Review of Systems  Constitutional: Negative.   HENT: Positive for congestion.   Eyes: Negative.   Respiratory: Positive for cough.   Cardiovascular: Negative.   Gastrointestinal: Negative.   Endocrine: Negative.   Genitourinary: Negative.   Musculoskeletal: Negative.   Skin: Negative.   Allergic/Immunologic: Negative.   Neurological: Negative.   Hematological: Negative.   Psychiatric/Behavioral: Negative.        Objective:   Physical Exam  Constitutional: She is oriented to person, place, and time. She appears well-developed and well-nourished. No distress.  For her age  HENT:  Head: Normocephalic and atraumatic.  Right Ear: External ear normal.  Left Ear: External ear normal.  Nose: Nose normal.  Mouth/Throat: Oropharynx is clear and moist.  Appears to be well hydrated as oral mucosa is moist  Eyes: Conjunctivae and EOM are normal. Pupils are equal, round, and reactive to light. Right eye exhibits no discharge. Left eye exhibits no discharge. No scleral icterus.  Neck: Normal range of motion. Neck supple. No JVD present. No thyromegaly present.  Cardiovascular: Normal rate, regular rhythm and normal heart sounds.   No murmur heard. At 72/m  Pulmonary/Chest: Effort normal. No respiratory distress. She has wheezes. She has rales. She exhibits no tenderness.  There is increased wheezing bilaterally and there is right midlung congestion with deep breathing  Musculoskeletal: She exhibits no edema.  The patient is in a wheelchair.  Lymphadenopathy:    She has no cervical adenopathy.  Neurological: She is alert and oriented to person, place, and time.  Skin: Skin  is warm and dry. No rash noted.  Psychiatric: She has a normal mood and affect. Her behavior is normal. Judgment and thought content normal.  Nursing note and vitals reviewed.   BP 132/83 mmHg  Pulse 59  Temp(Src) 98.1 F (36.7 C) (Oral)  Ht '5\' 4"'  (1.626 m)  Wt 171 lb (77.565 kg)  BMI 29.34 kg/m2  [facility] reading (PRIMARY) by  Dr. Laurance Flatten- chest x-ray- small bilateral pleural effusions an  Increase right. Hilar chest congestion. Atherosclerotic thoracic aorta                                 Results for orders placed or performed in visit on 05/27/14  POCT CBC  Result Value Ref Range   WBC 7.0 4.6 - 10.2 K/uL   Lymph, poc 2.2 0.6 - 3.4   POC LYMPH PERCENT 30.8 10 - 50 %L   POC Granulocyte 4.4 2 - 6.9   Granulocyte percent 62.7 37 - 80 %G   RBC 4.6 4.04 - 5.48 M/uL   Hemoglobin 12.2 12.2 - 16.2 g/dL   HCT, POC 43.9 37.7 - 47.9 %   MCV 95.2 80 - 97 fL   MCH, POC 26.4 (A) 27 - 31.2 pg   MCHC 27.7 (A) 31.8 - 35.4 g/dL   RDW, POC 16.5 %   Platelet Count, POC 244.0 142 - 424 K/uL   MPV 7.3 0 - 99.8 fL   The patient and her family member were made aware of the CBC result and the chest x-ray result.     Assessment & Plan:  1. Cough - Continue with mucinex - POCT CBC - BMP8+EGFR - DG Chest 2 View; Future  2. Thoracic aorta atherosclerosis -The patients daughter was made aware of this today  3. Malaise The increased malaise is most likely secondarily to pneumonia  4. Wheezing -she will be admitted and treatment with nebulizor treatments  5. Right middle lobe pneumonia -Hospital admission planned  Patient Instructions   Because of the increased congestion and wheezing and malaise and the diminished pulse ox we will most likely arrange for you to be admitted at the hospital.  They will continue to treat you with antibiotics DB breathing treatments and oxygen if needed.    The [facility] was [facility] and admission was it ranged to a medical  bed   Arrie Senate MD

## 2014-05-27 NOTE — H&P (Signed)
[facility] History and Physical  Gisele Pack WUJ:[id number] DOB: 01/09/1919 DOA: 05/27/2014  Referring physician: Dr Christell Constant PCP: Rudi Heap, MD   Chief Complaint: cough and congestion for one week.   HPI: Antonique Langford is a 81 y.o. female with prior h/o hypertension, CKD comes inf or cough with productive sputum and congestion for one week. She also reports some chills, no fevers. She was seen Dr Kathi Der office and was sent to the hospital for admission for evaluation and management of CAP. cxr showed some bilateral effusions and mild congestion.  ON arrival to the floor, pt was wheezing bilaterally. Her labs revealed baseline CKD. She was admitted to medical service for management of CAP AND ASTHMA EXACERBATION.    Review of Systems:  See HPI for pertinent positive history.    Past Medical History  Diagnosis Date  . Hypertension   . Thyroid disease   . Asthma   . Hyperlipidemia   . Gout   . CKD (chronic kidney disease)    Past Surgical History  Procedure Laterality Date  . Rotator cuff repair     Social History:  reports that she has never smoked. She has never used smokeless tobacco. She reports that she does not drink alcohol or use illicit drugs.  Allergies  Allergen Reactions  . Lisinopril Other (See Comments)    Side of face dropped.  Looked like patient had had a stroke.  . Sulfa Antibiotics     Possible reaction- vomited And angio edema  . Ciprofloxacin Nausea Only  . Macrobid [Nitrofurantoin Macrocrystal] Nausea Only     Family history of hypertension present.   Prior to Admission medications   Medication Sig Start Date End Date Taking? Authorizing Provider  albuterol (PROVENTIL HFA;VENTOLIN HFA) 108 (90 BASE) MCG/ACT inhaler Inhale 2 puffs into the lungs every 6 (six) hours as needed for wheezing. 11/16/12  Yes Ernestina Penna, MD  allopurinol (ZYLOPRIM) 100 MG tablet TAKE TWO TABLETS BY MOUTH DAILY Patient taking differently: Take 100 mg by  mouth daily.  02/04/14  Yes Ernestina Penna, MD  amLODipine (NORVASC) 5 MG tablet TAKE ONE (1) TABLET EACH DAY 09/27/13  Yes Ernestina Penna, MD  aspirin EC 81 MG tablet Take 81 mg by mouth daily.   Yes Historical Provider, MD  azithromycin (ZITHROMAX) 250 MG tablet As directed Patient taking differently: Take 250-500 mg by mouth daily. Take 2 tabs on day 1, then 1 tab daily until finished.  Starting 05/24/2014 x 5 days. 05/23/14  Yes Ernestina Penna, MD  brinzolamide (AZOPT) 1 % ophthalmic suspension Place 1 drop into the right eye See admin instructions. Uses at 0800 and 1200.   Yes Historical Provider, MD  Cranberry 500 MG CAPS Take 1 capsule by mouth daily.   Yes Historical Provider, MD  levothyroxine (SYNTHROID, LEVOTHROID) 75 MCG tablet TAKE ONE TABLET EVERY MORNING Patient taking differently: Take 75 mcg by mouth daily before breakfast.  02/04/14  Yes Ernestina Penna, MD  loratadine (CLARITIN) 10 MG tablet Take 10 mg by mouth daily.   Yes Historical Provider, MD  LORazepam (ATIVAN) 0.5 MG tablet Take 1 tablet (0.5 mg total) by mouth 2 (two) times daily. As directed 02/04/14  Yes Ernestina Penna, MD  Multiple Vitamin (MULTIVITAMIN WITH MINERALS) TABS tablet Take 1 tablet by mouth daily.   Yes Historical Provider, MD  pantoprazole (PROTONIX) 40 MG tablet Take 1 tablet (40 mg total) by mouth daily. 02/04/14  Yes Ernestina Penna, MD  salmeterol (  SEREVENT DISKUS) 50 MCG/DOSE diskus inhaler USE 1 INHALTAION TWICE DAILY Patient taking differently: Inhale 1 puff into the lungs 2 (two) times daily.  02/04/14  Yes Ernestina Penna, MD  sertraline (ZOLOFT) 50 MG tablet TAKE 1/2 TABLET DAILY Patient taking differently: Take 25 mg by mouth daily.  02/04/14  Yes Ernestina Penna, MD  theophylline (THEODUR) 300 MG 12 hr tablet Take 0.5 tablets (150 mg total) by mouth 2 (two) times daily. 1/2 tablet BID 02/04/14  Yes Ernestina Penna, MD  travoprost, benzalkonium, (TRAVATAN) 0.004 % ophthalmic solution Place 1 drop into the  right eye at bedtime.   Yes Historical Provider, MD   Physical Exam: Filed Vitals:   05/27/14 1400 05/27/14 1634  BP: 165/75   Pulse: 91   Temp: 98 F (36.7 C)   TempSrc: Oral   Resp: 18   Height:  (1.626 m)   Weight: 77.565 kg (171 lb)   SpO2: 91% 86%    Wt Readings from Last 3 Encounters:  05/27/14 77.565 kg (171 lb)  05/27/14 77.565 kg (171 lb)  04/02/14 78.926 kg (174 lb)    General:  Appears calm and comfortable Eyes: PERRL, normal lids, irises & conjunctiva Neck: no LAD, masses or thyromegaly Cardiovascular: RRR, no m/r/g. No LE edema. Respiratory: bilateral exp wheezing heard.  Abdomen: soft, ntnd Skin: no rash or induration seen on limited exam Musculoskeletal: right lower extremity swollen then left lower extremity.  Neurologic: confused, but alert and oriented to place and person. Able to move alle xtremities, speech normal. No focal deficits.           Labs on Admission:  Basic Metabolic Panel:  Recent Labs Lab 05/27/14 1536  NA 136  K 3.6  CL 104  CO2 24  GLUCOSE 108*  BUN 17  CREATININE 1.30*  CALCIUM 9.0   Liver Function Tests: No results for input(s): AST, ALT, ALKPHOS, BILITOT, PROT, ALBUMIN in the last 168 hours. No results for input(s): LIPASE, AMYLASE in the last 168 hours. No results for input(s): AMMONIA in the last 168 hours. CBC:  Recent Labs Lab 05/27/14 1023  WBC 7.0  HGB 12.2  HCT 43.9  MCV 95.2   Cardiac Enzymes: No results for input(s): CKTOTAL, CKMB, CKMBINDEX, TROPONINI in the last 168 hours.  BNP (last 3 results) No results for input(s): BNP in the last 8760 hours.  ProBNP (last 3 results) No results for input(s): PROBNP in the last 8760 hours.  CBG: No results for input(s): GLUCAP in the last 168 hours.  Radiological Exams on Admission: Dg Chest 2 View  05/27/2014   ADDENDUM REPORT: 05/27/2014 11:31  ADDENDUM: In the body of the report it should read no acute bony abnormality. In the impression report  issued. Small bilateral pleural effusions appear. Similar findings were noted on prior chest x-ray of 1 /15/ 2015.   Electronically Signed   By: Maisie Fus  Register   On: 05/27/2014 11:31   05/27/2014   CLINICAL DATA:  Congestion x1 week .  EXAM: CHEST  2 VIEW  COMPARISON:  None.  FINDINGS: Mediastinum and hilar structures are normal. Cardiomegaly with mild pulmonary vascular prominence. Bilateral small pleural effusions are noted. No focal infiltrate. No acute bony abnormality P  IMPRESSION: 1. Cardiomegaly with mild pulmonary vascular prominence. 2. Small bilateral pleural effusions appear  Electronically Signed: ByMaisie Fus  Register On: 05/27/2014 10:32    EKG: pending.   Assessment/Plan Active Problems:   CAP (community acquired pneumonia)   Probable  Early Pneumonia: - admit to med surg.  Started on IV rocephin and zithromax.  Nasal oxygen as needed. Sputum cultures are ordered. IV steroids, duonebs every 4 hours.    Asthma exacerbation: Started with IV steroids, duonebs.    Stage 2 CKD: Appears to be at baseline.    Hypothyroidism: Resume synthroid. Get TSH.     Code Status:  Full code  DVT Prophylaxis: Family Communication: family at bedside Disposition Plan: pending PT EVAL.   Time spent: 55 minutes  [facility] Healthcare SystemKULA,Takako Minckler [facility] Pager [phone].

## 2014-05-27 NOTE — Patient Instructions (Signed)
Because of the increased congestion and wheezing and malaise and the diminished pulse ox we will most likely arrange for you to be admitted at the hospital.  They will continue to treat you with antibiotics DB breathing treatments and oxygen if needed.

## 2014-05-27 NOTE — Telephone Encounter (Signed)
Pt daughter called and aware of appt time today

## 2014-05-27 NOTE — Progress Notes (Signed)
ANTIBIOTIC CONSULT NOTE - INITIAL  Pharmacy Consult for Ceftriaxone Indication: pneumonia  Allergies  Allergen Reactions  . Lisinopril Other (See Comments)    Side of face dropped.  Looked like patient had had a stroke.  . Sulfa Antibiotics     Possible reaction- vomited And angio edema  . Ciprofloxacin Nausea Only  . Macrobid [Nitrofurantoin Macrocrystal] Nausea Only    Patient Measurements: Height: 5\' 4"  (162.6 cm) Weight: 171 lb (77.565 kg) IBW/kg (Calculated) : 54.7 Adjusted Body Weight:   Vital Signs: Temp: 98 F (36.7 C) (02/08 1400) Temp Source: Oral (02/08 1400) BP: 165/75 mmHg (02/08 1400) Pulse Rate: 91 (02/08 1400) Intake/Output from previous day:   Intake/Output from this shift:    Labs:  Recent Labs  05/27/14 1023 05/27/14 1536  WBC 7.0  --   HGB 12.2  --   CREATININE  --  1.30*   Estimated Creatinine Clearance: 27.3 mL/min (by C-G formula based on Cr of 1.3). No results for input(s): VANCOTROUGH, VANCOPEAK, VANCORANDOM, GENTTROUGH, GENTPEAK, GENTRANDOM, TOBRATROUGH, TOBRAPEAK, TOBRARND, AMIKACINPEAK, AMIKACINTROU, AMIKACIN in the last 72 hours.   Microbiology: No results found for this or any previous visit (from the past 720 hour(s)).  Medical History: Past Medical History  Diagnosis Date  . Hypertension   . Thyroid disease   . Asthma   . Hyperlipidemia   . Gout   . CKD (chronic kidney disease)     Medications:  Scheduled:  . [START ON 05/28/2014] amLODipine  5 mg Oral Daily  . aspirin EC  81 mg Oral Daily  . azithromycin  500 mg Intravenous Q24H  . [START ON 05/28/2014] brinzolamide  1 drop Right Eye 2 times per day  . budesonide-formoterol  2 puff Inhalation BID  . cefTRIAXone (ROCEPHIN)  IV  1 g Intravenous Q24H  . enoxaparin (LOVENOX) injection  40 mg Subcutaneous Q24H  . ipratropium-albuterol  3 mL Nebulization Q4H  . latanoprost  1 drop Right Eye QHS  . [START ON 05/28/2014] levothyroxine  75 mcg Oral QAC breakfast  . loratadine   10 mg Oral Daily  . LORazepam  0.5 mg Oral QHS  . methylPREDNISolone (SOLU-MEDROL) injection  60 mg Intravenous Q6H  . pantoprazole  40 mg Oral Daily  . sertraline  25 mg Oral Daily   Assessment: Started on Zpack last Friday, over weekend family feels patient worsened Admitted for CAP Azithromycin 500 mg IV every 24 hours started  Goal of Therapy:  Eradicate infection  Plan:  Ceftriaxone 1 GM IV every 24 hours Labs per protocol F/U LOT  Raquel JamesPittman, Amilcar Reever Bennett 05/27/2014,4:41 PM

## 2014-05-28 ENCOUNTER — Inpatient Hospital Stay (HOSPITAL_COMMUNITY): Payer: Medicare Other

## 2014-05-28 ENCOUNTER — Encounter (HOSPITAL_COMMUNITY): Payer: Self-pay | Admitting: Cardiology

## 2014-05-28 DIAGNOSIS — R0600 Dyspnea, unspecified: Secondary | ICD-10-CM

## 2014-05-28 DIAGNOSIS — I481 Persistent atrial fibrillation: Secondary | ICD-10-CM

## 2014-05-28 DIAGNOSIS — J40 Bronchitis, not specified as acute or chronic: Secondary | ICD-10-CM

## 2014-05-28 DIAGNOSIS — I10 Essential (primary) hypertension: Secondary | ICD-10-CM

## 2014-05-28 LAB — TSH: TSH: 0.724 u[IU]/mL (ref 0.350–4.500)

## 2014-05-28 LAB — BMP8+EGFR
BUN/Creatinine Ratio: 13 (ref 11–26)
BUN: 16 mg/dL (ref 10–36)
CALCIUM: 9.2 mg/dL (ref 8.7–10.3)
CO2: 23 mmol/L (ref 18–29)
CREATININE: 1.24 mg/dL — AB (ref 0.57–1.00)
Chloride: 100 mmol/L (ref 97–108)
GFR calc non Af Amer: 38 mL/min/{1.73_m2} — ABNORMAL LOW (ref >59)
GFR, EST AFRICAN AMERICAN: 43 mL/min/{1.73_m2} — AB (ref >59)
Glucose: 134 mg/dL — ABNORMAL HIGH (ref 65–99)
Potassium: 3.9 mmol/L (ref 3.5–5.2)
Sodium: 138 mmol/L (ref 134–144)

## 2014-05-28 LAB — STREP PNEUMONIAE URINARY ANTIGEN: Strep Pneumo Urinary Antigen: NEGATIVE

## 2014-05-28 LAB — TROPONIN I
TROPONIN I: 0.04 ng/mL — AB (ref <0.031)
Troponin I: 0.05 ng/mL — ABNORMAL HIGH (ref <0.031)

## 2014-05-28 LAB — RAPID STREP SCREEN (MED CTR MEBANE ONLY): Streptococcus, Group A Screen (Direct): NEGATIVE

## 2014-05-28 LAB — D-DIMER, QUANTITATIVE: D-Dimer, Quant: 1 ug/mL-FEU — ABNORMAL HIGH (ref 0.00–0.48)

## 2014-05-28 MED ORDER — FUROSEMIDE 10 MG/ML IJ SOLN
20.0000 mg | Freq: Every day | INTRAMUSCULAR | Status: DC
  Administered 2014-05-28 – 2014-05-29 (×2): 20 mg via INTRAVENOUS
  Filled 2014-05-28 (×2): qty 2

## 2014-05-28 MED ORDER — DILTIAZEM HCL 30 MG PO TABS
30.0000 mg | ORAL_TABLET | Freq: Four times a day (QID) | ORAL | Status: DC
  Administered 2014-05-28: 30 mg via ORAL
  Filled 2014-05-28: qty 1

## 2014-05-28 MED ORDER — METOPROLOL TARTRATE 25 MG PO TABS
25.0000 mg | ORAL_TABLET | Freq: Two times a day (BID) | ORAL | Status: DC
  Administered 2014-05-28 – 2014-05-29 (×2): 25 mg via ORAL
  Filled 2014-05-28 (×3): qty 1

## 2014-05-28 MED ORDER — ENOXAPARIN SODIUM 30 MG/0.3ML ~~LOC~~ SOLN
30.0000 mg | SUBCUTANEOUS | Status: DC
  Administered 2014-05-28 – 2014-06-03 (×7): 30 mg via SUBCUTANEOUS
  Filled 2014-05-28 (×7): qty 0.3

## 2014-05-28 MED ORDER — GUAIFENESIN-DM 100-10 MG/5ML PO SYRP
5.0000 mL | ORAL_SOLUTION | ORAL | Status: DC | PRN
  Administered 2014-05-28: 5 mL via ORAL
  Filled 2014-05-28: qty 5

## 2014-05-28 MED ORDER — MENTHOL 3 MG MT LOZG
1.0000 | LOZENGE | OROMUCOSAL | Status: DC | PRN
  Administered 2014-05-29: 3 mg via ORAL
  Filled 2014-05-28 (×3): qty 9

## 2014-05-28 MED ORDER — BENZONATATE 100 MG PO CAPS
100.0000 mg | ORAL_CAPSULE | Freq: Two times a day (BID) | ORAL | Status: DC | PRN
Start: 2014-05-28 — End: 2014-06-08
  Administered 2014-05-31 – 2014-06-06 (×6): 100 mg via ORAL
  Filled 2014-05-28 (×6): qty 1

## 2014-05-28 NOTE — Evaluation (Signed)
Physical Therapy Evaluation Patient Details Name: Daisy Scott MRN: [id number] DOB: Jul 08, 1918 Today's Date: 05/28/2014   History of Present Illness  Daisy Scott is a 81 y.o. female with prior h/o hypertension, CKD comes inf or cough with productive sputum and congestion for one week. She also reports some chills, no fevers. She was seen Dr Kathi DerMoore's office and was sent to the hospital for admission for evaluation and management of CAP. cxr showed some bilateral effusions and mild congestion.  ON arrival to the floor, pt was wheezing bilaterally. Her labs revealed baseline CKD. She was admitted to medical service for management of CAP AND ASTHMA EXACERBATION.   Clinical Impression  Pt is a 81 year old female who presents to PT with dx of CAP.  Family present during evaluation and report pt is mod (I) with functional mobility skills in the home, though does have 24/7 assist/supervision with all activity.  At EOB, O2 taken off to assess sats without supplemental O2 though dropped to 92% and O2 reapplied prior to transfers and gait.  Pt was mod (I) with bed mobility skills, and min guard and use of RW for transfers and gait.  Gait distance limited secondary to fatigue and SOB.  O2 sats after gait on 2L via [place] to 86%; required 3-4 minute rest break until sats increased to 94%.  Recommend continued PT in the hospital to assess activity tolerance with or without supplemental O2 as medical status improves.  No follow up PT or DME recommended.     Follow Up Recommendations No PT follow up    Equipment Recommendations  None recommended by PT       Precautions / Restrictions Precautions Precautions: Fall Restrictions Weight Bearing Restrictions: No      Mobility  Bed Mobility Overal bed mobility: Modified Independent             General bed mobility comments: Required seated rest break at EOB secondary to SOB after movement.  O2 taken off at EOB to see if transfers/gait could be  assessed without O2, though sats dropped to 92% without supplemental O2.   Transfers Overall transfer level: Needs assistance Equipment used: Rolling walker (2 wheeled) Transfers: Sit to/from Stand Sit to Stand: Min guard            Ambulation/Gait Ambulation/Gait assistance: Min guard Ambulation Distance (Feet): 60 Feet Assistive device: Rolling walker (2 wheeled) Gait Pattern/deviations: WFL(Within Functional Limits)   Gait velocity interpretation: Below normal speed for age/gender General Gait Details: Continuous VC for technique as pt alternated between standing too far or too close to RW.  O2 sats after gait 86% while on 2L O2; required 3-4 minute rest break in bed to increase to 94%      Balance Overall balance assessment: No apparent balance deficits (not formally assessed)                                           Pertinent Vitals/Pain Pain Assessment: No/denies pain    Home Living Family/patient expects to be discharged to:: Private residence Living Arrangements: Alone (Has family and aide assist 24/7) Available Help at Discharge: Family;Available 24 hours/day Type of Home: House Home Access: Level entry     Home Layout: Multi-level (Pt does not use basement or 2nd floor) Home Equipment: Walker - 4 wheels;Walker - 2 wheels;Cane - single point;Bedside commode;Shower seat - built in;Grab bars -  toilet;Grab bars - tub/shower;Wheelchair - manual Additional Comments: Walk In Shower    Prior Function Level of Independence: Independent with assistive device(s);Needs assistance   Gait / Transfers Assistance Needed: Per family, pt was mod (I) with bed mobility skills, transfers, and gait in household.   ADL's / Homemaking Assistance Needed: Pt family, family completes cooking and laundry.   Comments: Pt does have a W/C for longer distances, which she does not self propel        Extremity/Trunk Assessment               Lower Extremity  Assessment: Overall WFL for tasks assessed         Communication   Communication: No difficulties  Cognition Arousal/Alertness: Awake/alert Behavior During Therapy: WFL for tasks assessed/performed Overall Cognitive Status: Within Functional Limits for tasks assessed                        Assessment/Plan    PT Assessment Patient needs continued PT services  PT Diagnosis Difficulty walking   PT Problem List Decreased activity tolerance;Decreased mobility  PT Treatment Interventions Therapeutic exercise;Functional mobility training;Therapeutic activities;Patient/family education   PT Goals (Current goals can be found in the Care Plan section) Acute Rehab PT Goals Patient Stated Goal: breath easier PT Goal Formulation: With patient/family Time For Goal Achievement: 06/11/14 Potential to Achieve Goals: Good    Frequency Min 2X/week    End of Session Equipment Utilized During Treatment: Gait belt;Oxygen (2L) Activity Tolerance: Other (comment);Patient limited by fatigue (Limited by SOB) Patient left: in bed;with call bell/phone within reach;with family/visitor present           Time: 1610-9604 PT Time Calculation (min) (ACUTE ONLY): 31 min   Charges:   PT Evaluation $Initial PT Evaluation Tier I: 1 Procedure     Kellie Shropshire, DPT [phone]  05/28/2014, 4:22 PM

## 2014-05-28 NOTE — Progress Notes (Signed)
Notified MD of patient's abnormal urine sample.

## 2014-05-28 NOTE — Care Management Note (Addendum)
    Page 1 of 1   06/07/2014     9:42:22 AM CARE MANAGEMENT NOTE 06/07/2014  Patient:  Daisy Scott,Daisy Scott   Account Number:  [id number]  Date Initiated:  05/28/2014  Documentation initiated by:  Kathyrn SheriffHILDRESS,JESSICA  Subjective/Objective Assessment:   Pt is admitted with pna. Pt is from home, lives alone but have 24/7 caregivers either by family or private duty aids. Pt has cane, rollator, wheelchair, bsc, shower chair at home. Pt has no med needs prior to admission.     Action/Plan:   Pt plans to dsicharge home with previous arrangment. PT eval pending. Will cont to follow for CM needs.   Anticipated DC Date:  05/21/2014   Anticipated DC Plan:  SKILLED NURSING FACILITY  In-house referral  Clinical Social Worker      DC Planning Services  CM consult      Choice offered to / List presented to:             Status of service:  In process, will continue to follow Medicare Important Message given?  YES (If response is "NO", the following Medicare IM given date fields will be blank) Date Medicare IM given:  05/31/2014 Medicare IM given by:  Kathyrn SheriffHILDRESS,JESSICA Date Additional Medicare IM given:  06/07/2014 Additional Medicare IM given by:  JESSICA CHILDRESS  Discharge Disposition:  SKILLED NURSING FACILITY  Per UR Regulation:  Reviewed for med. necessity/level of care/duration of stay  If discussed at Long Length of Stay Meetings, dates discussed:   06/04/2014  06/06/2014    Comments:  06/07/2014 0900 Kathyrn Sheriffjessica Childress, RN, MSN, CM Pt expected to discharge to SNF over weekend. No CM needs.  06/06/2014 1400 Kathyrn SheriffJessica Childress, RN, MSN, CM Per PT's recommendation pt will be discharged to SNF for rehab. CSW aware of discharge plan and will arrange for placement. Pt will be transfered to tele. Will continue to follow.  05/31/2014 1300 Kathyrn SheriffJessica Childress, RN, MSN, CM Pt transferred to ICU and placed on BIPAP.  05/28/2014 1300 [name], RN, MSN, CM

## 2014-05-28 NOTE — Progress Notes (Addendum)
[facility] PROGRESS NOTE  Daisy Scott ZOX:[id number] DOB: 1918/05/22 DOA: 05/27/2014 PCP: Rudi Heap, MD Interim summary: Daisy Scott is a 81 y.o. female with prior h/o hypertension, CKD comes inf or cough with productive sputum and congestion for one week, associated with diffuse wheezing. She was admitted for asthma exacerbation and early pneumonia. She was started on IV rocephin and IV zithromax, IV steroids and duonebs.   Assessment/Plan: Probable Early Pneumonia:  Started on IV rocephin and zithromax.  Nasal oxygen as needed. Sputum cultures are ordered. IV steroids, duonebs every 4 hours.  Requiring 2 liters of nasal canula oxygen . Sputum cultures ordered. Blood cultures not done.    Asthma exacerbation: still bilateral wheezing heard.  Started with IV steroids, duonebs. Started on symbicort.    Stage 2 CKD: Appears to be at baseline.    Hypothyroidism: Resume synthroid. Get TSH.   Code Status: full code.  Family Communication: care giver at bedside Disposition Plan: pending , home when wheezing improves   Consultants:  Physical therapy  Procedures:  none  Antibiotics:  IV rocpehin 2/8  IV zithromax  HPI/Subjective: Little better buut still coughing.   Objective: Filed Vitals:   05/28/14 0512  BP:   Pulse: 81  Temp:   Resp:     Intake/Output Summary (Last 24 hours) at 05/28/14 1104 Last data filed at 05/28/14 0100  Gross per 24 hour  Intake    300 ml  Output      0 ml  Net    300 ml   Filed Weights   05/27/14 1400  Weight: 77.565 kg (171 lb)    Exam:   General:  Alert afebrile not in any distress  Cardiovascular: s1s2, tachycardic.  Respiratory: bilateral expiratory wheezing heard  Abdomen: soft non tender non distended bowel sounds heard  Musculoskeletal: right lower extremity slightly bigger then left, mildly edematous too.   Data Reviewed: Basic Metabolic Panel:  Recent Labs Lab 05/27/14 1009  05/27/14 1536  NA 138 136  K 3.9 3.6  CL 100 104  CO2 23 24  GLUCOSE 134* 108*  BUN 16 17  CREATININE 1.24* 1.30*  CALCIUM 9.2 9.0   Liver Function Tests: No results for input(s): AST, ALT, ALKPHOS, BILITOT, PROT, ALBUMIN in the last 168 hours. No results for input(s): LIPASE, AMYLASE in the last 168 hours. No results for input(s): AMMONIA in the last 168 hours. CBC:  Recent Labs Lab 05/27/14 1023  WBC 7.0  HGB 12.2  HCT 43.9  MCV 95.2   Cardiac Enzymes: No results for input(s): CKTOTAL, CKMB, CKMBINDEX, TROPONINI in the last 168 hours. BNP (last 3 results) No results for input(s): BNP in the last 8760 hours.  ProBNP (last 3 results) No results for input(s): PROBNP in the last 8760 hours.  CBG: No results for input(s): GLUCAP in the last 168 hours.  Recent Results (from the past 240 hour(s))  Culture, expectorated sputum-assessment     Status: None   Collection Time: 05/27/14  8:49 PM  Result Value Ref Range Status   Specimen Description SPUTUM  Final   Special Requests NONE  Final   Sputum evaluation   Final    THIS SPECIMEN IS ACCEPTABLE. RESPIRATORY CULTURE REPORT TO FOLLOW. PERFORMED AT [facility]    Report Status 05/27/2014 FINAL  Final     Studies: Dg Chest 2 View  05/27/2014   ADDENDUM REPORT: 05/27/2014 11:31  ADDENDUM: In the body of the report it should read no acute bony abnormality. In the impression  report issued. Small bilateral pleural effusions appear. Similar findings were noted on prior chest x-ray of 1 /15/ 2015.   Electronically Signed   By: Maisie Fushomas  Register   On: 05/27/2014 11:31   05/27/2014   CLINICAL DATA:  Congestion x1 week .  EXAM: CHEST  2 VIEW  COMPARISON:  None.  FINDINGS: Mediastinum and hilar structures are normal. Cardiomegaly with mild pulmonary vascular prominence. Bilateral small pleural effusions are noted. No focal infiltrate. No acute bony abnormality P  IMPRESSION: 1. Cardiomegaly with mild pulmonary vascular prominence. 2. Small  bilateral pleural effusions appear  Electronically Signed: ByMaisie Fus: Thomas  Register On: 05/27/2014 10:32   [place] Venous Img Lower Unilateral Right  05/28/2014   CLINICAL DATA:  Right leg swelling  EXAM: RIGHT LOWER EXTREMITY VENOUS DUPLEX ULTRASOUND  TECHNIQUE: Doppler venous assessment of the right lower extremity deep venous system was performed, including characterization of spectral flow, compressibility, and phasicity.  COMPARISON:  None.  FINDINGS: There is complete compressibility of the right common femoral, femoral, and popliteal veins. Doppler analysis demonstrates respiratory phasicity and augmentation of flow upon calf compression. No obvious calf vein thrombosis.  IMPRESSION: No evidence of right lower extremity DVT.   Electronically Signed   By: Jolaine ClickArthur  Hoss M.D.   On: 05/28/2014 10:16    Scheduled Meds: . amLODipine  5 mg Oral Daily  . aspirin EC  81 mg Oral Daily  . azithromycin  500 mg Intravenous Q24H  . brinzolamide  1 drop Right Eye 2 times per day  . budesonide-formoterol  2 puff Inhalation BID  . cefTRIAXone (ROCEPHIN)  IV  1 g Intravenous Q24H  . enoxaparin (LOVENOX) injection  30 mg Subcutaneous Q24H  . ipratropium-albuterol  3 mL Nebulization Q4H  . latanoprost  1 drop Right Eye QHS  . levothyroxine  75 mcg Oral QAC breakfast  . loratadine  10 mg Oral Daily  . LORazepam  0.5 mg Oral QHS  . methylPREDNISolone (SOLU-MEDROL) injection  60 mg Intravenous Q6H  . pantoprazole  40 mg Oral Daily  . sertraline  25 mg Oral Daily   Continuous Infusions:   Active Problems:   Hypertension   Hypothyroid   Asthma, chronic   CAP (community acquired pneumonia)    Time spent: 20 minutes    Daisy Scott,Daisy Scott  [facility] Pager [phone]. If 7PM-7AM, please contact night-coverage at [email or web address], password [email or web address] 05/28/2014, 11:04 AM  LOS: 1 day             Addendum: EKG looks abnormal and showed atrial fib with rate in 120/min, ordered telemetry monitoring,  echocardiogram, and troponins. She reports chest pain on coughing. Cardiology consult requested. D dimer ordered and if elevated will get CT angio vs V/Q scan to evaluate for PE.  Her [place] Score IS 2. She is on aspirin 81 mg daily .   Kathlen ModyVIJAYA Jaelin Devincentis, MD [phone]

## 2014-05-28 NOTE — Care Management Utilization Note (Signed)
UR completed 

## 2014-05-28 NOTE — Consult Note (Signed)
Consulting cardiologist: Dr. Jonelle Sidle  Reason for consultation: Newly documented atrial fibrillation  Clinical Summary Daisy Scott is a 81 y.o.female admitted to [facility] from Dr. Kathi Der office on February 8 with recent cough and sputum production, shortness of breath, and also associated wheezing. She is being managed for bronchitis versus evolving pneumonia, chest x-ray showing vascular prominence and small bilateral pleural effusions. She is afebrile and has a normal white blood cell count. Dr. Blake Divine evaluated the patient on the hospitalist team today, continued Rocephin and azithromycin as well as oxygen supplementation, steroids, and breathing treatments. She was noted to have a rapid irregular heart rate resulting in ECG which shows newly documented atrial fibrillation and left bundle branch block. We were consulted to assist with her management.  I spoke with the patient's daughter and also a caregiver present in the room. Daisy Scott lives at home with 24-hour assistance. She ambulates to limited degree. Did have a recent fall associated with missing sitting down on a chair, but no other injuries or obvious recurrent falls. She follows closely with Dr. Christell Constant for primary care.  Both the patient's daughter and the caregiver indicate that Daisy Scott does mention at times her heart feels like it is "racing." This might happen in the morning when she gets up, sometimes when she has walked to the bathroom and back to her seat.  CHADSVASC score is 42 (age, gender, hypertension, atherosclerosis by prior CT). We discussed her annual stroke risk with atrial fibrillation, also risk of bleeding. On an aspirin daily, her annual stroke risk would be approximately 8% versus Eliquis with a stroke risk closer to 3%. At the present time, the daughter indicated that they did not want to pursue anticoagulation and would continue her on aspirin.  I reviewed previous available ECGs, no prior  documentation of atrial fibrillation. She has had previously documented IVCD consistent with incomplete left bundle branch block. Dr. Blake Divine has sent cardiac markers and ordered an echocardiogram.   Allergies  Allergen Reactions  . Lisinopril Other (See Comments)    Side of face dropped.  Looked like patient had had a stroke.  . Sulfa Antibiotics     Possible reaction- vomited And angio edema  . Ciprofloxacin Nausea Only  . Macrobid [Nitrofurantoin Macrocrystal] Nausea Only    Medications Scheduled Medications: . amLODipine  5 mg Oral Daily  . aspirin EC  81 mg Oral Daily  . azithromycin  500 mg Intravenous Q24H  . brinzolamide  1 drop Right Eye 2 times per day  . budesonide-formoterol  2 puff Inhalation BID  . cefTRIAXone (ROCEPHIN)  IV  1 g Intravenous Q24H  . enoxaparin (LOVENOX) injection  30 mg Subcutaneous Q24H  . ipratropium-albuterol  3 mL Nebulization Q4H  . latanoprost  1 drop Right Eye QHS  . levothyroxine  75 mcg Oral QAC breakfast  . loratadine  10 mg Oral Daily  . LORazepam  0.5 mg Oral QHS  . methylPREDNISolone (SOLU-MEDROL) injection  60 mg Intravenous Q6H  . pantoprazole  40 mg Oral Daily  . sertraline  25 mg Oral Daily    PRN Medications: acetaminophen **OR** acetaminophen, albuterol, benzonatate, guaiFENesin-dextromethorphan, menthol-cetylpyridinium, ondansetron **OR** ondansetron (ZOFRAN) IV   Past Medical History  Diagnosis Date  . Essential hypertension   . Hypothyroidism   . Asthma   . Hyperlipidemia   . Gout   . CKD (chronic kidney disease) stage 3, GFR 30-59 ml/min   . Ascending aorta dilation  4.2 cm 2011   . Atrial fibrillation     Documented February 2016 - question paroxysmal     Past Surgical History  Procedure Laterality Date  . Rotator cuff repair      Family History  Problem Relation Age of Onset  . Family history unknown: Yes    Social History Daisy Scott reports that she has never smoked. She has never used smokeless  tobacco. Daisy Scott reports that she does not drink alcohol.  Review of Systems Complete review of systems negative except as otherwise outlined in the clinical summary and also the following. Hard of hearing. Chronic shortness of breath, NYHA class III with exertion. No chest pain.  Physical Examination Blood pressure 166/93, pulse 81, temperature 98 F (36.7 C), temperature source Oral, resp. rate 28, height  (1.626 m), weight 171 lb (77.565 kg), SpO2 95 %.  Intake/Output Summary (Last 24 hours) at 05/28/14 1416 Last data filed at 05/28/14 0100  Gross per 24 hour  Intake    300 ml  Output      0 ml  Net    300 ml   Pleasant elderly woman in no distress. HEENT: Conjunctiva and lids normal, oropharynx clear. Neck: Supple, elevated JVP, no carotid bruits, no thyromegaly. Lungs: Expiratory wheezes with scattered rhonchi, mildly labored breathing at rest. Cardiac: Rapid, irregularly irregular, no S3, soft systolic murmur, no pericardial rub. Abdomen: Soft, nontender, bowel sounds present. Extremities: Mild leg edema, right greater than left, distal pulses 2+. Skin: Warm and dry. Musculoskeletal: No kyphosis. Neuropsychiatric: Alert and oriented x2, affect grossly appropriate.   Lab Results  Basic Metabolic Panel:  Recent Labs Lab 05/27/14 1009 05/27/14 1536  NA 138 136  K 3.9 3.6  CL 100 104  CO2 23 24  GLUCOSE 134* 108*  BUN 16 17  CREATININE 1.24* 1.30*  CALCIUM 9.2 9.0    CBC:  Recent Labs Lab 05/27/14 1023  WBC 7.0  HGB 12.2  HCT 43.9  MCV 95.2    Cardiac Enzymes:  Recent Labs Lab 05/28/14 0635  TROPONINI 0.04*    Imaging EXAM: RIGHT LOWER EXTREMITY VENOUS DUPLEX ULTRASOUND  TECHNIQUE: Doppler venous assessment of the right lower extremity deep venous system was performed, including characterization of spectral flow, compressibility, and phasicity.  COMPARISON: None.  FINDINGS: There is complete compressibility of the right  common femoral, femoral, and popliteal veins. Doppler analysis demonstrates respiratory phasicity and augmentation of flow upon calf compression. No obvious calf vein thrombosis.  IMPRESSION: No evidence of right lower extremity DVT.  Impression  1. Newly documented atrial fibrillation with rapid ventricular response, left bundle branch block by ECG with prior evidence of IVCD. Could potentially be new in onset in association with upper respiratory tract infection, although also question paroxysmal atrial fibrillation based on symptom review per family above. CHADSVASC score is 5 as outlined, patient's daughter declines pursuing anticoagulation at this time however. TSH normal. Echocardiogram pending.  2. Minor increase in troponin I of 0.04, potentially related to rapid atrial fibrillation rather than true ACS. Cycle full set of cardiac markers.  3. Possible bronchitis versus early pneumonia, although no acute infiltrates by chest x-ray. Would also exclude cardiomyopathy and CHF exacerbation in the setting of atrial fibrillation.  4. Essential hypertension.  5. CKD, stage 3, present creatinine 0.3.  6. History of asthma with exacerbation.  Recommendations  Discussed with patient, daughter, and caregiver in room. We will follow-up on the echocardiogram that has been ordered. Obtain set of cardiac markers  to better assess trend, although doubt true ACS at this point. Continue aspirin per above discussion. Also plan to transition from Norvasc to Cardizem to provide both blood pressure and heart rate control. She is being placed on telemetry for better monitoring of heart rate variability. No plans for cardioversion this time.  Jonelle SidleSamuel G. Ronique Simerly, M.D., F.A.C.C.

## 2014-05-28 NOTE — Progress Notes (Signed)
    See consult note and echocardiogram report. Evidence of associated cardiomyopathy with LVEF 25-30%. Could also be contributing to her symptoms and exacerbated by atrial fibrillation and increased heart rate. Will change from Cardizem to Lopressor and add low dose Lasix. Anticipate medical management.  Jonelle SidleSamuel G. Natnael Biederman, M.D., F.A.C.C.

## 2014-05-28 NOTE — Progress Notes (Signed)
  Echocardiogram 2D Echocardiogram has been performed.  Daisy Scott 05/28/2014, 3:14 PM

## 2014-05-29 ENCOUNTER — Inpatient Hospital Stay (HOSPITAL_COMMUNITY): Payer: Medicare Other

## 2014-05-29 ENCOUNTER — Encounter (HOSPITAL_COMMUNITY): Payer: Self-pay

## 2014-05-29 DIAGNOSIS — J441 Chronic obstructive pulmonary disease with (acute) exacerbation: Secondary | ICD-10-CM | POA: Diagnosis present

## 2014-05-29 DIAGNOSIS — I4891 Unspecified atrial fibrillation: Secondary | ICD-10-CM | POA: Diagnosis present

## 2014-05-29 DIAGNOSIS — E039 Hypothyroidism, unspecified: Secondary | ICD-10-CM

## 2014-05-29 DIAGNOSIS — R0902 Hypoxemia: Secondary | ICD-10-CM

## 2014-05-29 DIAGNOSIS — J189 Pneumonia, unspecified organism: Principal | ICD-10-CM

## 2014-05-29 DIAGNOSIS — J45901 Unspecified asthma with (acute) exacerbation: Secondary | ICD-10-CM

## 2014-05-29 DIAGNOSIS — I509 Heart failure, unspecified: Secondary | ICD-10-CM

## 2014-05-29 DIAGNOSIS — J209 Acute bronchitis, unspecified: Secondary | ICD-10-CM | POA: Insufficient documentation

## 2014-05-29 DIAGNOSIS — R7989 Other specified abnormal findings of blood chemistry: Secondary | ICD-10-CM | POA: Insufficient documentation

## 2014-05-29 DIAGNOSIS — I429 Cardiomyopathy, unspecified: Secondary | ICD-10-CM

## 2014-05-29 DIAGNOSIS — R791 Abnormal coagulation profile: Secondary | ICD-10-CM

## 2014-05-29 LAB — BASIC METABOLIC PANEL
ANION GAP: 9 (ref 5–15)
BUN: 32 mg/dL — ABNORMAL HIGH (ref 6–23)
CO2: 24 mmol/L (ref 19–32)
Calcium: 8.2 mg/dL — ABNORMAL LOW (ref 8.4–10.5)
Chloride: 89 mmol/L — ABNORMAL LOW (ref 96–112)
Creatinine, Ser: 1.48 mg/dL — ABNORMAL HIGH (ref 0.50–1.10)
GFR calc non Af Amer: 29 mL/min — ABNORMAL LOW (ref >90)
GFR, EST AFRICAN AMERICAN: 34 mL/min — AB (ref >90)
Glucose, Bld: 132 mg/dL — ABNORMAL HIGH (ref 70–99)
POTASSIUM: 3.9 mmol/L (ref 3.5–5.1)
Sodium: 122 mmol/L — ABNORMAL LOW (ref 135–145)

## 2014-05-29 LAB — URINE CULTURE
CULTURE: NO GROWTH
Colony Count: NO GROWTH

## 2014-05-29 LAB — URINALYSIS, ROUTINE W REFLEX MICROSCOPIC
BILIRUBIN URINE: NEGATIVE
Glucose, UA: NEGATIVE mg/dL
Ketones, ur: NEGATIVE mg/dL
Leukocytes, UA: NEGATIVE
NITRITE: NEGATIVE
Protein, ur: NEGATIVE mg/dL
Specific Gravity, Urine: 1.015 (ref 1.005–1.030)
UROBILINOGEN UA: 0.2 mg/dL (ref 0.0–1.0)
pH: 5.5 (ref 5.0–8.0)

## 2014-05-29 LAB — CBC WITH DIFFERENTIAL/PLATELET
Basophils Absolute: 0 10*3/uL (ref 0.0–0.1)
Basophils Relative: 0 % (ref 0–1)
Eosinophils Absolute: 0 10*3/uL (ref 0.0–0.7)
Eosinophils Relative: 0 % (ref 0–5)
HCT: 38.2 % (ref 36.0–46.0)
Hemoglobin: 12.6 g/dL (ref 12.0–15.0)
LYMPHS ABS: 0.7 10*3/uL (ref 0.7–4.0)
Lymphocytes Relative: 6 % — ABNORMAL LOW (ref 12–46)
MCH: 31 pg (ref 26.0–34.0)
MCHC: 33 g/dL (ref 30.0–36.0)
MCV: 93.9 fL (ref 78.0–100.0)
MONO ABS: 0.7 10*3/uL (ref 0.1–1.0)
Monocytes Relative: 6 % (ref 3–12)
Neutro Abs: 11.2 10*3/uL — ABNORMAL HIGH (ref 1.7–7.7)
Neutrophils Relative %: 88 % — ABNORMAL HIGH (ref 43–77)
PLATELETS: 232 10*3/uL (ref 150–400)
RBC: 4.07 MIL/uL (ref 3.87–5.11)
RDW: 15.2 % (ref 11.5–15.5)
WBC: 12.6 10*3/uL — ABNORMAL HIGH (ref 4.0–10.5)

## 2014-05-29 LAB — SODIUM: Sodium: 122 mmol/L — ABNORMAL LOW (ref 135–145)

## 2014-05-29 LAB — URINE MICROSCOPIC-ADD ON

## 2014-05-29 LAB — LEGIONELLA ANTIGEN, URINE

## 2014-05-29 LAB — TROPONIN I: Troponin I: 0.07 ng/mL — ABNORMAL HIGH (ref <0.031)

## 2014-05-29 LAB — SODIUM, URINE, RANDOM: Sodium, Ur: <10 mmol/L

## 2014-05-29 LAB — MAGNESIUM: MAGNESIUM: 1.5 mg/dL (ref 1.5–2.5)

## 2014-05-29 LAB — CREATININE, URINE, RANDOM: Creatinine, Urine: 42.18 mg/dL

## 2014-05-29 MED ORDER — LEVALBUTEROL HCL 0.63 MG/3ML IN NEBU
0.6300 mg | INHALATION_SOLUTION | RESPIRATORY_TRACT | Status: DC
  Administered 2014-05-29 – 2014-06-04 (×34): 0.63 mg via RESPIRATORY_TRACT
  Filled 2014-05-29 (×34): qty 3

## 2014-05-29 MED ORDER — LEVALBUTEROL HCL 0.63 MG/3ML IN NEBU
0.6300 mg | INHALATION_SOLUTION | RESPIRATORY_TRACT | Status: DC | PRN
  Administered 2014-05-30 – 2014-06-08 (×2): 0.63 mg via RESPIRATORY_TRACT
  Filled 2014-05-29 (×2): qty 3

## 2014-05-29 MED ORDER — HALOPERIDOL LACTATE 5 MG/ML IJ SOLN
INTRAMUSCULAR | Status: AC
  Filled 2014-05-29: qty 1

## 2014-05-29 MED ORDER — AZITHROMYCIN 250 MG PO TABS
500.0000 mg | ORAL_TABLET | Freq: Every day | ORAL | Status: DC
Start: 2014-05-29 — End: 2014-05-30
  Administered 2014-05-29: 500 mg via ORAL
  Filled 2014-05-29: qty 2

## 2014-05-29 MED ORDER — TECHNETIUM TC 99M DIETHYLENETRIAME-PENTAACETIC ACID
40.0000 | Freq: Once | INTRAVENOUS | Status: AC | PRN
  Administered 2014-05-29: 42 via INTRAVENOUS

## 2014-05-29 MED ORDER — FUROSEMIDE 10 MG/ML IJ SOLN: 40.0000 mg | Freq: Every day | INTRAMUSCULAR | Status: DC

## 2014-05-29 MED ORDER — METHYLPREDNISOLONE SODIUM SUCC 125 MG IJ SOLR
60.0000 mg | Freq: Three times a day (TID) | INTRAMUSCULAR | Status: DC
  Administered 2014-05-29 – 2014-05-30 (×4): 60 mg via INTRAVENOUS
  Filled 2014-05-29 (×4): qty 2

## 2014-05-29 MED ORDER — DILTIAZEM HCL 60 MG PO TABS
60.0000 mg | ORAL_TABLET | Freq: Four times a day (QID) | ORAL | Status: DC
  Administered 2014-05-29 – 2014-05-30 (×4): 60 mg via ORAL
  Filled 2014-05-29 (×4): qty 1

## 2014-05-29 MED ORDER — IPRATROPIUM BROMIDE 0.02 % IN SOLN: 0.5000 mg | RESPIRATORY_TRACT | Status: DC | PRN

## 2014-05-29 MED ORDER — MAGNESIUM SULFATE 2 GM/50ML IV SOLN
2.0000 g | Freq: Once | INTRAVENOUS | Status: AC
  Administered 2014-05-29: 2 g via INTRAVENOUS
  Filled 2014-05-29: qty 50

## 2014-05-29 MED ORDER — IPRATROPIUM BROMIDE 0.02 % IN SOLN
0.5000 mg | RESPIRATORY_TRACT | Status: DC
  Administered 2014-05-29 – 2014-06-04 (×34): 0.5 mg via RESPIRATORY_TRACT
  Filled 2014-05-29 (×32): qty 2.5

## 2014-05-29 MED ORDER — TECHNETIUM TO 99M ALBUMIN AGGREGATED
6.0000 | Freq: Once | INTRAVENOUS | Status: AC | PRN
  Administered 2014-05-29: 6 via INTRAVENOUS

## 2014-05-29 NOTE — Progress Notes (Addendum)
[facility] PROGRESS NOTE  Daisy Scott BJY:[id number] DOB: [birth year] DOA: 05/27/2014 PCP: Rudi HeapMOORE, DONALD, MD  Assessment/Plan: #1 hypoxia Questionable etiology. Likely multifactorial secondary to possible early pneumonia versus bronchitis in the setting of A. fib with RVR and probable acute systolic CHF exacerbation. Patient does have a elevated d-dimer and concern for pulmonary emboli. Per daughter patient with prior history of pulmonary emboli and DVT several years ago. Patient for VQ scan. Patient has been placed on low-dose diuretics per cardiology for possible cardiac asthma. Lopressor has been changed to Cardizem. Rate control. Patient currently on aspirin. Continue empiric Antibiotics. Follow.  #2 A. fib with RVR Patient with left bundle branch block by EKG with prior evidence of IVCD. A. fib with RVR could be secondary to acute respiratory tract infection. Patient's chads score is 5. Patient's daughter declines pursuing anticoagulation at this time. TSH is normal. 2-D echo with EF of 25-30%, with diffuse hypokinesis and septal dyssynergy. Lopressor has been changed to Cardizem per cardiology secondary to wheezing. Continue aspirin. Keep magnesium greater than 2. Cardiology following and appreciate input and recommendations.  #3 probable early community-acquired pneumonia versus bronchitis Patient still with hypoxia with some wheezing. Continue empiric antibiotics of azithromycin and Rocephin. Will change her DuoNeb nebs to Xopenex and Atrovent nebs secondary to A. fib with RVR. Continue Claritin. Continue oxygen. Follow.  #4 probable copd exac vs asthma exacerbation Patient with shortness of breath on minimal exertion. Patient with minimal to mild wheezing with poor air movement. Continue IV steroid taper, empiric IV antibiotics. Change DuoNeb to Xopenex. Oxygen. Follow.  #5 probable acute systolic CHF exacerbation/cardiomyopathy Patient has been placed on low-dose IV Lasix per  cardiology. Patient with mildly elevated troponins felt to be secondary to A. fib with RVR. 2-D echo with EF of 25-30% with diffuse hypokinesis and septal dyssynergy. Cardiology following and appreciate input and recommendations.  #6 chronic kidney disease stage II Stable. Monitor creatinine with diuresis. Follow.  #7 hypothyroidism TSH within normal limits. Continue home dose Synthroid.  #8 hyponatremia Repeat sodium levels. If still low will get urine studies and osmolality levels. Patient has been started on diuretics. Follow.  #9 hypertension Stable. On Cardizem.  #10 positive d-dimer Patient with hypoxia and shortness of breath. Patient with prior history of PE and DVT per daughter. Will check a VQ scan to rule out PE.  #11 prophylaxis Lovenox for DVT prophylaxis.    Code Status: Full Family Communication: Updated patient and daughter at bedside. Disposition Plan: Remain inpatient.   Consultants:  Cardiology: Dr Diona BrownerMcDowell 05/28/14  Procedures:  2 d echo 05/28/14  Chest x-ray 05/27/2014    lower extremity Dopplers 05/28/2014 negative  Antibiotics:  IV ROCEPHIN 05/27/2014  IV AZITHROMYCIN 05/27/2014   HPI/Subjective: Patient states still with SOB. Patient states no significant improvement.  Objective: Filed Vitals:   05/29/14 0700  BP: 148/83  Pulse: 91  Temp:   Resp:     Intake/Output Summary (Last 24 hours) at 05/29/14 1102 Last data filed at 05/29/14 0545  Gross per 24 hour  Intake    480 ml  Output    250 ml  Net    230 ml   Filed Weights   05/27/14 1400  Weight: 77.565 kg (171 lb)    Exam:   General:  NAD. SOB on minimal exertion.  Cardiovascular: Irregularly irregular  Respiratory: Some minimal expiratory wheezing. Some bibasilar coarse breath sounds.  Abdomen: Soft, nontender, nondistended, positive bowel sounds.  Musculoskeletal: No clubbing cyanosis or edema.  Data  Reviewed: Basic Metabolic Panel:  Recent Labs Lab  05/27/14 1009 05/27/14 1536 05/29/14 0802  NA 138 136 122*  K 3.9 3.6 3.9  CL 100 104 89*  CO2 GLUCOSE 134* 108* 132*  BUN 16 17 32*  CREATININE 1.24* 1.30* 1.48*  CALCIUM 9.2 9.0 8.2*  MG  --   --  1.5   Liver Function Tests: No results for input(s): AST, ALT, ALKPHOS, BILITOT, PROT, ALBUMIN in the last 168 hours. No results for input(s): LIPASE, AMYLASE in the last 168 hours. No results for input(s): AMMONIA in the last 168 hours. CBC:  Recent Labs Lab 05/27/14 1023 05/29/14 0802  WBC 7.0 12.6*  NEUTROABS  --  11.2*  HGB 12.2 12.6  HCT 43.9 38.2  MCV 95.2 93.9  PLT  --  232   Cardiac Enzymes:  Recent Labs Lab 05/28/14 0635 05/28/14 1924 05/29/14 0100  TROPONINI 0.04* 0.05* 0.07*   BNP (last 3 results) No results for input(s): BNP in the last 8760 hours.  ProBNP (last 3 results) No results for input(s): PROBNP in the last 8760 hours.  CBG: No results for input(s): GLUCAP in the last 168 hours.  Recent Results (from the past 240 hour(s))  Culture, expectorated sputum-assessment     Status: None   Collection Time: 05/27/14  8:49 PM  Result Value Ref Range Status   Specimen Description SPUTUM  Final   Special Requests NONE  Final   Sputum evaluation   Final    THIS SPECIMEN IS ACCEPTABLE. RESPIRATORY CULTURE REPORT TO FOLLOW. PERFORMED AT [facility]    Report Status 05/27/2014 FINAL  Final  Culture, respiratory (NON-Expectorated)     Status: None (Preliminary result)   Collection Time: 05/27/14  8:49 PM  Result Value Ref Range Status   Specimen Description SPUTUM EXPECTORATED  Final   Special Requests NONE  Final   Gram Stain   Final    FEW WBC PRESENT,BOTH PMN AND MONONUCLEAR FEW SQUAMOUS EPITHELIAL CELLS PRESENT FEW GRAM POSITIVE COCCI IN PAIRS RARE GRAM NEGATIVE RODS Performed at [facility]    Culture   Final    NORMAL OROPHARYNGEAL FLORA Performed at [facility]    Report Status PENDING  Incomplete  Rapid strep  screen     Status: None   Collection Time: 05/28/14 12:25 PM  Result Value Ref Range Status   Streptococcus, Group A Screen (Direct) NEGATIVE NEGATIVE Final    Comment: (NOTE) A Rapid Antigen test may result negative if the antigen level in the sample is below the detection level of this test. The FDA has not cleared this test as a stand-alone test therefore the rapid antigen negative result has reflexed to a Group A Strep culture.      Studies: US Venous Img Lower Unilateral Right  05/28/2014   CLINICAL DATA:  Right leg swelling  EXAM: RIGHT LOWER EXTREMITY VENOUS DUPLEX ULTRASOUND  TECHNIQUE: Doppler venous assessment of the right lower extremity deep venous system was performed, including characterization of spectral flow, compressibility, and phasicity.  COMPARISON:  None.  FINDINGS: There is complete compressibility of the right common femoral, femoral, and popliteal veins. Doppler analysis demonstrates respiratory phasicity and augmentation of flow upon calf compression. No obvious calf vein thrombosis.  IMPRESSION: No evidence of right lower extremity DVT.   Electronically Signed   By: Jolaine Click M.D.   On: 05/28/2014 10:16    Scheduled Meds: . aspirin EC  81 mg Oral Daily  . azithromycin  500 mg Intravenous Q24H  . brinzolamide  1 drop Right Eye 2 times per day  . budesonide-formoterol  2 puff Inhalation BID  . cefTRIAXone (ROCEPHIN)  IV  1 g Intravenous Q24H  . diltiazem  60 mg Oral 4 times per day  . enoxaparin (LOVENOX) injection  30 mg Subcutaneous Q24H  . [START ON 05/30/2014] furosemide  40 mg Intravenous Daily  . haloperidol lactate      . ipratropium  0.5 mg Nebulization Q4H  . latanoprost  1 drop Right Eye QHS  . levalbuterol  0.63 mg Nebulization Q4H  . levothyroxine  75 mcg Oral QAC breakfast  . loratadine  10 mg Oral Daily  . LORazepam  0.5 mg Oral QHS  . magnesium sulfate 1 - 4 g bolus IVPB  2 g Intravenous Once  . methylPREDNISolone (SOLU-MEDROL) injection  60  mg Intravenous Q6H  . pantoprazole  40 mg Oral Daily  . sertraline  25 mg Oral Daily   Continuous Infusions:   Principal Problem:   Hypoxia Active Problems:   CAP (community acquired pneumonia)   Asthma with acute exacerbation   Acute bronchitis   New onset a-fib   Hypertension   Hyperlipemia   Hypothyroid   CKD (chronic kidney disease)   Asthma, chronic   Hyponatremia   Positive D dimer   Cardiomyopathy   Acute exacerbation of CHF (congestive heart failure); Probable    Time spent: 40 mins    [facility] MD [facility] Pager [phone]. If 7PM-7AM, please contact night-coverage at [email or web address], password [facility] 05/29/2014, 11:02 AM  LOS: 2 days

## 2014-05-29 NOTE — Progress Notes (Signed)
Physical Therapy Treatment Patient Details Name: Daisy Scott MRN: [id number] DOB: May 26, 1918 Today's Date: 05/29/2014    History of Present Illness Daisy Scott is a 81 y.o. female with prior h/o hypertension, CKD comes inf or cough with productive sputum and congestion for one week. She also reports some chills, no fevers. She was seen Dr Kathi DerMoore's office and was sent to the hospital for admission for evaluation and management of CAP. cxr showed some bilateral effusions and mild congestion.  ON arrival to the floor, pt was wheezing bilaterally. Her labs revealed baseline CKD. She was admitted to medical service for management of CAP AND ASTHMA EXACERBATION.     PT Comments    Patient was seen in bed, awake, alert, oriented, cooperative and able to follow directions well. Tolerated all therapeutic exercises with no LOB, or signs of intolerance. O2 saturation dropped to 85% after gait training. Current functional mobility: bed mobility modified (I), transfers and gait min guard assist. Patient was able to ambulate increased distance today to 75 feet with flexed stoop posture. Functional mobility is limited by fatigue and SOB. Patient reports " My back is painful" while performing gait ambulation and required encouragement to complete gait to return to room. Patient was left in chair with family and MD inside the room.      Equipment Recommendations  none       Precautions / Restrictions Precautions Precautions: Fall Restrictions Weight Bearing Restrictions: No    Mobility  Bed Mobility Overal bed mobility: Modified IndependentGeneral bed mobility comments: Required seated rest break at EOB secondary to SOB after movement.    Transfers Overall transfer level: Needs assistance Equipment used: Rolling walker (2 wheeled) Transfers: Sit to/from Stand Sit to Stand: Min guard   Ambulation/Gait Ambulation/Gait assistance: Min guard Ambulation Distance (Feet): 75 Feet Assistive  device: Rolling walker (2 wheeled) Gait Pattern/deviations: WFL(Within Functional Limits)  General Gait Details: Continuous VC for technique as pt alternated between standing too far or too close to RW.  O2 sats after gait 85% while on 2L O2; required 3-4 minute rest break in bed to increase to 94%    Balance Overall balance assessment: No apparent balance deficits (not formally assessed)    Cognition Arousal/Alertness: Awake/alert Behavior During Therapy: WFL for tasks assessed/performed Overall Cognitive Status: Within Functional Limits for tasks assessed    Exercises Total Joint Exercises Ankle Circles/Pumps: AROM;Both;20 reps;Supine Quad Sets: AROM;Both;20 reps;Supine Gluteal Sets: AROM;Both;20 reps;Supine Heel Slides: AROM;Both;20 reps;Supine Hip ABduction/ADduction: AROM;Both;20 reps;Supine Straight Leg Raises: AAROM;Both;10 reps;Supine        Pertinent Vitals/Pain Pain Assessment: No/denies pain           PT Goals (current goals can now be found in the care plan section) Progress towards PT goals: Progressing toward goals       PT Plan Current plan remains appropriate       End of Session Equipment Utilized During Treatment: Gait belt;Oxygen Activity Tolerance: Patient limited by fatigue;Patient tolerated treatment well Patient left: in bed;with call bell/phone within reach;with family/visitor present     Time: [id number] PT Time Calculation (min) (ACUTE ONLY): 40 min  Charges:  $Gait Training: 8-22 mins $Therapeutic Exercise: 8-22 mins                         Prarthana Parlin A 05/29/2014, 3:53 PM

## 2014-05-29 NOTE — Progress Notes (Signed)
Subjective:  Doesn't feel well today. "Just tired and worn out."  Objective:  Vital Signs in the last 24 hours: Temp:  [97.6 F (36.4 C)-98.5 F (36.9 C)] 98 F (36.7 C) (02/10 0556) Pulse Rate:  [91-133] 91 (02/10 0700) Resp:  [20-22] 22 (02/10 0556) BP: (142-158)/(76-122) 148/83 mmHg (02/10 0700) SpO2:  [86 %-96 %] 87 % (02/10 0659)  Intake/Output from previous day: 02/09 0701 - 02/10 0700 In: 840 [P.O.:840] Out: 250 [Urine:250]  Physical Exam: NECK:Slight increase JVD, HJR,  LUNGS:Decreased breath sounds with diffuse inspiratory and expiratory wheezing. HEART: Irregular rate and rhythm, distant heart sounds with no murmur, gallop, rub, bruit, thrill, or heave EXTREMITIES: Without cyanosis, clubbing, or edema   Lab Results:  Recent Labs  05/27/14 1023 05/29/14 0802  WBC 7.0 12.6*  HGB 12.2 12.6  PLT  --  232    Recent Labs  05/27/14 1536 05/29/14 0802  NA 136 122*  K 3.6 3.9  CL 104 89*  CO2 24 24  GLUCOSE 108* 132*  BUN 17 32*  CREATININE 1.30* 1.48*    Recent Labs  05/28/14 1924 05/29/14 0100  TROPONINI 0.05* 0.07*    Cardiac Studies: 2Decho 05/28/14: Study Conclusions  - Procedure narrative: Transthoracic echocardiography. Image   quality was adequate. The study was technically difficult. - Left ventricle: The cavity size was normal. Wall thickness was   normal. Systolic function was severely reduced. The estimated   ejection fraction was in the range of 25% to 30%. Diffuse   hypokinesis. Cannot exclude hypokinesis to akinesis of the mid to   apical anteroseptal and mid to basal inferolateral myocardium.   The study is not technically sufficient to allow evaluation of LV   diastolic function. - Ventricular septum: Septal motion showed dyssynergy. - Aortic valve: Mildly to moderately calcified annulus. Trileaflet. - Mitral valve: Calcified annulus. There was mild regurgitation. - Left atrium: The atrium was mildly dilated. - Right  atrium: Central venous pressure (est): 15 mm Hg. - Tricuspid valve: There was trivial regurgitation. - Pulmonary arteries: Systolic pressure could not be accurately   estimated. - Pericardium, extracardiac: There was no pericardial effusion.  Impressions:  - Images are limited. Normal LV wall thickness with LVEF   approximately 25-30%, septal dyssynergy noted with LBBB. Wall   motion abnormalities also noted as outlined above. Indeterminate   diastolic function. Mild mitral regurgitation. Trivial tricuspid   regurgitation. Unable to assess PASP, but CVP elevated.   Assessment/Plan:   1. Newly documented atrial fibrillation with rapid ventricular response, left bundle branch block by ECG with prior evidence of IVCD. Could potentially be new in onset in association with upper respiratory tract infection, although also question paroxysmal atrial fibrillation based on symptom review per family above. CHADSVASC score is 5 as outlined, patient's daughter declines pursuing anticoagulation at this time however. TSH normal. Echocardiogram EF 25-30%. With wheezing will stop lopressor and begin cardizem 60 mg every 6 hrs. Increase IV Lasix to 40 mg daily. Watch creatine.  2. Minor increase in troponin I of 0.04, 0.05, 0.07 potentially related to rapid atrial fibrillation rather than true ACS.   3. Possible bronchitis versus early pneumonia, although no acute infiltrates by chest x-ray.   4. cardiomyopathy with possible CHF exacerbation in the setting of atrial fibrillation. Add low dose Lasix.  4. Essential hypertension.  5. CKD, stage 3, present creatinine 0.48.  6. History of asthma with exacerbation.    LOS: 2 days    Jacolyn ReedyMichele Lenze 05/29/2014,  8:58 AM   Attending note:  Patient seen and examined. Case discussed with Ms. Geni Bers PA-C. I agree with her assessment. Patient still with chest congestion and intermittent coughing. No chest pain. Lungs exhibit coarse breath sounds with  expiratory wheezes, cardiac exam with irregularly irregular heart rate. Atrial fibrillation rates are not well controlled, we initiated Lopressor yesterday with finding of cardiomyopathy by echocardiogram, although with continued wheezing, plan is to go back to Cardizem for now. Also starting Lasix to diurese in case there is a component of "cardiac asthma." Still doubt ACS with minimal, flat elevation in troponin I. We will continue to follow with you.  Jonelle Sidle, M.D., F.A.C.C.

## 2014-05-29 NOTE — Progress Notes (Signed)
ANTIBIOTIC CONSULT NOTE -  Pharmacy Consult for Ceftriaxone / Zithromax Indication: pneumonia  Allergies  Allergen Reactions  . Lisinopril Other (See Comments)    Side of face dropped.  Looked like patient had had a stroke.  . Sulfa Antibiotics     Possible reaction- vomited And angio edema  . Ciprofloxacin Nausea Only  . Macrobid [Nitrofurantoin Macrocrystal] Nausea Only    Patient Measurements: Height: 5\' 4"  (162.6 cm) Weight: 171 lb (77.565 kg) IBW/kg (Calculated) : 54.7 Adjusted Body Weight:   Vital Signs: Temp: 98 F (36.7 C) (02/10 0556) Temp Source: Axillary (02/10 0556) BP: 133/75 mmHg (02/10 1230) Pulse Rate: 91 (02/10 0700) Intake/Output from previous day: 02/09 0701 - 02/10 0700 In: 840 [P.O.:840] Out: 250 [Urine:250] Intake/Output from this shift:    Labs:  Recent Labs  05/27/14 1009 05/27/14 1023 05/27/14 1536 05/29/14 0802  WBC  --  7.0  --  12.6*  HGB  --  12.2  --  12.6  PLT  --   --   --  232  CREATININE 1.24*  --  1.30* 1.48*   Estimated Creatinine Clearance: 24 mL/min (by C-G formula based on Cr of 1.48). No results for input(s): VANCOTROUGH, VANCOPEAK, VANCORANDOM, GENTTROUGH, GENTPEAK, GENTRANDOM, TOBRATROUGH, TOBRAPEAK, TOBRARND, AMIKACINPEAK, AMIKACINTROU, AMIKACIN in the last 72 hours.   Microbiology: Recent Results (from the past 720 hour(s))  Culture, expectorated sputum-assessment     Status: None   Collection Time: 05/27/14  8:49 PM  Result Value Ref Range Status   Specimen Description SPUTUM  Final   Special Requests NONE  Final   Sputum evaluation   Final    THIS SPECIMEN IS ACCEPTABLE. RESPIRATORY CULTURE REPORT TO FOLLOW. PERFORMED AT [facility]    Report Status 05/27/2014 FINAL  Final  Culture, respiratory (NON-Expectorated)     Status: None (Preliminary result)   Collection Time: 05/27/14  8:49 PM  Result Value Ref Range Status   Specimen Description SPUTUM EXPECTORATED  Final   Special Requests NONE  Final   Gram Stain    Final    FEW WBC PRESENT,BOTH PMN AND MONONUCLEAR FEW SQUAMOUS EPITHELIAL CELLS PRESENT FEW GRAM POSITIVE COCCI IN PAIRS RARE GRAM NEGATIVE RODS Performed at [facility]    Culture   Final    NORMAL OROPHARYNGEAL FLORA Performed at [facility]    Report Status PENDING  Incomplete  Rapid strep screen     Status: None   Collection Time: 05/28/14 12:25 PM  Result Value Ref Range Status   Streptococcus, Group A Screen (Direct) NEGATIVE NEGATIVE Final    Comment: (NOTE) A Rapid Antigen test may result negative if the antigen level in the sample is below the detection level of this test. The FDA has not cleared this test as a stand-alone test therefore the rapid antigen negative result has reflexed to a Group A Strep culture.     Medical History: Past Medical History  Diagnosis Date  . Essential hypertension   . Hypothyroidism   . Asthma   . Hyperlipidemia   . Gout   . CKD (chronic kidney disease) stage 3, GFR 30-59 ml/min   . Ascending aorta dilation     4.2 cm 2011   . Atrial fibrillation     Documented February 2016 - question paroxysmal     Medications:  Scheduled:  . aspirin EC  81 mg Oral Daily  . azithromycin  500 mg Oral q1800  . brinzolamide  1 drop Right Eye 2 times per day  .  budesonide-formoterol  2 puff Inhalation BID  . cefTRIAXone (ROCEPHIN)  IV  1 g Intravenous Q24H  . diltiazem  60 mg Oral 4 times per day  . enoxaparin (LOVENOX) injection  30 mg Subcutaneous Q24H  . [START ON 05/30/2014] furosemide  40 mg Intravenous Daily  . haloperidol lactate      . ipratropium  0.5 mg Nebulization Q4H  . latanoprost  1 drop Right Eye QHS  . levalbuterol  0.63 mg Nebulization Q4H  . levothyroxine  75 mcg Oral QAC breakfast  . loratadine  10 mg Oral Daily  . LORazepam  0.5 mg Oral QHS  . magnesium sulfate 1 - 4 g bolus IVPB  2 g Intravenous Once  . methylPREDNISolone (SOLU-MEDROL) injection  60 mg Intravenous 3 times per day  . pantoprazole  40  mg Oral Daily  . sertraline  25 mg Oral Daily   Assessment: Admitted for CAP.  Pt currently afebrile.  Rocephin - Zithromax 2/8 >>  Goal of Therapy:  Eradicate infection  Plan:  Continue Ceftriaxone 1 GM IV every 24 hours  PHARMACIST - PHYSICIAN COMMUNICATION CONCERNING: Antibiotic IV to Oral Route Change Policy  RECOMMENDATION: This patient is receiving Zithromax by the intravenous route.  Based on criteria approved by the Pharmacy and Therapeutics Committee, the antibiotic(s) is/are being converted to the equivalent oral dose form(s).  DESCRIPTION: These criteria include:  Patient being treated for a respiratory tract infection, urinary tract infection, cellulitis or clostridium difficile associated diarrhea if on metronidazole  The patient is not neutropenic and does not exhibit a GI malabsorption state  The patient is eating (either orally or via tube) and/or has been taking other orally administered medications for a least 24 hours  The patient is improving clinically and has a Tmax < 100.5  If you have questions about this conversion, please contact the Pharmacy Department    [phone] )  [facility]   [phone] )  [facility]    [phone] )  [facility]   [phone] )  [facility] [place] A 05/29/2014,12:56 PM

## 2014-05-30 ENCOUNTER — Inpatient Hospital Stay (HOSPITAL_COMMUNITY): Payer: Medicare Other

## 2014-05-30 DIAGNOSIS — J441 Chronic obstructive pulmonary disease with (acute) exacerbation: Secondary | ICD-10-CM

## 2014-05-30 DIAGNOSIS — J9601 Acute respiratory failure with hypoxia: Secondary | ICD-10-CM

## 2014-05-30 DIAGNOSIS — J208 Acute bronchitis due to other specified organisms: Secondary | ICD-10-CM

## 2014-05-30 LAB — CBC WITH DIFFERENTIAL/PLATELET
Basophils Absolute: 0 10*3/uL (ref 0.0–0.1)
Basophils Relative: 0 % (ref 0–1)
Eosinophils Absolute: 0 10*3/uL (ref 0.0–0.7)
Eosinophils Relative: 0 % (ref 0–5)
HEMATOCRIT: 36.1 % (ref 36.0–46.0)
Hemoglobin: 12.1 g/dL (ref 12.0–15.0)
LYMPHS PCT: 6 % — AB (ref 12–46)
Lymphs Abs: 0.5 10*3/uL — ABNORMAL LOW (ref 0.7–4.0)
MCH: 30.6 pg (ref 26.0–34.0)
MCHC: 33.5 g/dL (ref 30.0–36.0)
MCV: 91.2 fL (ref 78.0–100.0)
MONOS PCT: 4 % (ref 3–12)
Monocytes Absolute: 0.4 10*3/uL (ref 0.1–1.0)
NEUTROS ABS: 8.5 10*3/uL — AB (ref 1.7–7.7)
Neutrophils Relative %: 90 % — ABNORMAL HIGH (ref 43–77)
Platelets: 222 10*3/uL (ref 150–400)
RBC: 3.96 MIL/uL (ref 3.87–5.11)
RDW: 14.3 % (ref 11.5–15.5)
WBC: 9.4 10*3/uL (ref 4.0–10.5)

## 2014-05-30 LAB — CULTURE, RESPIRATORY W GRAM STAIN: Culture: NORMAL

## 2014-05-30 LAB — BASIC METABOLIC PANEL
ANION GAP: 11 (ref 5–15)
ANION GAP: 9 (ref 5–15)
BUN: 39 mg/dL — AB (ref 6–23)
BUN: 42 mg/dL — AB (ref 6–23)
CALCIUM: 8 mg/dL — AB (ref 8.4–10.5)
CHLORIDE: 85 mmol/L — AB (ref 96–112)
CHLORIDE: 84 mmol/L — AB (ref 96–112)
CO2: 24 mmol/L (ref 19–32)
CO2: 22 mmol/L (ref 19–32)
Calcium: 8.3 mg/dL — ABNORMAL LOW (ref 8.4–10.5)
Creatinine, Ser: 1.49 mg/dL — ABNORMAL HIGH (ref 0.50–1.10)
Creatinine, Ser: 1.66 mg/dL — ABNORMAL HIGH (ref 0.50–1.10)
GFR calc Af Amer: 30 mL/min — ABNORMAL LOW (ref >90)
GFR calc non Af Amer: 29 mL/min — ABNORMAL LOW (ref >90)
GFR, EST AFRICAN AMERICAN: 34 mL/min — AB (ref >90)
GFR, EST NON AFRICAN AMERICAN: 25 mL/min — AB (ref >90)
GLUCOSE: 142 mg/dL — AB (ref 70–99)
Glucose, Bld: 121 mg/dL — ABNORMAL HIGH (ref 70–99)
POTASSIUM: 4 mmol/L (ref 3.5–5.1)
POTASSIUM: 4.6 mmol/L (ref 3.5–5.1)
Sodium: 120 mmol/L — ABNORMAL LOW (ref 135–145)
Sodium: 115 mmol/L — CL (ref 135–145)

## 2014-05-30 LAB — OSMOLALITY, URINE: Osmolality, Ur: 207 mOsm/kg — ABNORMAL LOW (ref 390–1090)

## 2014-05-30 LAB — BRAIN NATRIURETIC PEPTIDE: B Natriuretic Peptide: 1025 pg/mL — ABNORMAL HIGH (ref 0.0–100.0)

## 2014-05-30 LAB — CULTURE, GROUP A STREP

## 2014-05-30 LAB — CULTURE, RESPIRATORY

## 2014-05-30 LAB — MAGNESIUM: MAGNESIUM: 2 mg/dL (ref 1.5–2.5)

## 2014-05-30 LAB — OSMOLALITY: OSMOLALITY: 287 mosm/kg (ref 275–300)

## 2014-05-30 MED ORDER — GUAIFENESIN ER 600 MG PO TB12
1200.0000 mg | ORAL_TABLET | Freq: Two times a day (BID) | ORAL | Status: DC
  Administered 2014-05-30 – 2014-06-08 (×19): 1200 mg via ORAL
  Filled 2014-05-30 (×21): qty 2

## 2014-05-30 MED ORDER — FUROSEMIDE 10 MG/ML IJ SOLN
60.0000 mg | Freq: Once | INTRAMUSCULAR | Status: DC
  Filled 2014-05-30: qty 6

## 2014-05-30 MED ORDER — FUROSEMIDE 10 MG/ML IJ SOLN
20.0000 mg | Freq: Once | INTRAMUSCULAR | Status: AC
  Administered 2014-05-30: 20 mg via INTRAVENOUS
  Filled 2014-05-30: qty 2

## 2014-05-30 MED ORDER — LORAZEPAM 0.5 MG PO TABS
0.5000 mg | ORAL_TABLET | ORAL | Status: AC
  Administered 2014-05-30: 0.5 mg via ORAL
  Filled 2014-05-30 (×2): qty 1

## 2014-05-30 MED ORDER — SODIUM CHLORIDE 0.9 % IV SOLN: INTRAVENOUS | Status: DC

## 2014-05-30 MED ORDER — AMOXICILLIN-POT CLAVULANATE 875-125 MG PO TABS
1.0000 | ORAL_TABLET | Freq: Two times a day (BID) | ORAL | Status: DC
  Administered 2014-05-30 (×2): 1 via ORAL
  Filled 2014-05-30 (×2): qty 1

## 2014-05-30 MED ORDER — DILTIAZEM HCL ER COATED BEADS 240 MG PO CP24
240.0000 mg | ORAL_CAPSULE | Freq: Every day | ORAL | Status: DC
  Administered 2014-05-30 – 2014-06-08 (×10): 240 mg via ORAL
  Filled 2014-05-30 (×11): qty 1

## 2014-05-30 MED ORDER — METHYLPREDNISOLONE SODIUM SUCC 125 MG IJ SOLR
80.0000 mg | Freq: Four times a day (QID) | INTRAMUSCULAR | Status: DC
  Administered 2014-05-30 – 2014-05-31 (×2): 80 mg via INTRAVENOUS
  Filled 2014-05-30 (×3): qty 2

## 2014-05-30 MED ORDER — SODIUM CHLORIDE 0.9 % IV SOLN
INTRAVENOUS | Status: DC
  Administered 2014-05-30 – 2014-05-31 (×2): via INTRAVENOUS

## 2014-05-30 MED ORDER — SODIUM CHLORIDE 0.9 % IV SOLN
INTRAVENOUS | Status: DC
  Administered 2014-05-30: 12:00:00 via INTRAVENOUS

## 2014-05-30 NOTE — Progress Notes (Signed)
I was asked by Dr. Janee Mornhompson to evaluate this patient at the bedside for shortness of breath.  On my arrival, patient was sitting up in bed. She did not appear to be in any obvious distress. Her respiratory rate was mildly increased. She reported worsening shortness of breath. On physical exam, she appears to be somewhat anxious. Chest exam indicates bibasilar coarse rhonchi, with only mild upper airway wheezing. Otherwise lung sounds are diminished. She also has pseudo-wheezing on forced expiration. She has only mild lower extremity edema. Chest x-ray from today was reviewed that showed bibasilar infiltrates.  Review of medications indicates that she is already on antibiotics, she's been afebrile. She is already on intravenous steroids, bronchodilators, Mucinex. I suspect that her worsening shortness of breath is likely multifactorial, related to underlying pneumonia, COPD and likely element of anxiety. She does not appear to be significantly volume overloaded at this time. I have encouraged her to slow her breathing down and to use flutter valve frequently. Continue with bronchodilators, antibiotics and steroids. We'll also give her a dose of Ativan (as she takes at home), which should hopefully help with her anxiety. Continue to follow.  Daisy Scott

## 2014-05-30 NOTE — Progress Notes (Signed)
RT gave Pt another breathing TX.at 1800 and it didn't make any difference in the Pt's Breath sounds. Pt is struggling to breath and is getting a little anxious. RT notified the nurse and the nurse said she would page the doctor again. The Pt has gotten worse through the day.

## 2014-05-30 NOTE — Progress Notes (Signed)
[facility] PROGRESS NOTE  Daisy Scott WUJ:[id number] DOB: 03/10/1919 DOA: 05/27/2014 PCP: Rudi HeapMOORE, DONALD, MD  Assessment/Plan: #1 hypoxia Questionable etiology. Likely multifactorial secondary to possible early pneumonia versus bronchitis in the setting of A. fib with RVR and probable acute systolic CHF exacerbation. Patient does have a elevated d-dimer and concern for pulmonary emboli. Per daughter patient with prior history of pulmonary emboli and DVT several years ago. VQ scan negative for PE. Patient has been placed on low-dose diuretics per cardiology for possible cardiac asthma. Lopressor has been changed to Cardizem. Rate control. Patient currently on aspirin. Continue empiric Antibiotics. Will discontinue Lasix secondary to worsening hyponatremia and follow. Repeat chest x-ray. Follow.  #2 A. fib with RVR Patient with left bundle branch block by EKG with prior evidence of IVCD. A. fib with RVR could be secondary to acute respiratory tract infection. Patient's chads score is 5. Patient's daughter declines pursuing anticoagulation at this time. TSH is normal. 2-D echo with EF of 25-30%, with diffuse hypokinesis and septal dyssynergy. Lopressor has been changed to Cardizem per cardiology secondary to wheezing. Continue aspirin. Keep magnesium greater than 2. Cardiology following and appreciate input and recommendations.  #3 probable early community-acquired pneumonia versus bronchitis Patient still with hypoxia with some wheezing. Change IV antibiotics to oral Augmentin. Continue  Xopenex and Atrovent nebs. Continue Claritin. Continue oxygen. Follow.  #4 probable copd exac vs asthma exacerbation Patient with shortness of breath on minimal exertion. Patient with minimal to mild wheezing with poor air movement. Continue IV steroid taper, change IV antibiotics to oral antibiotics. Continue Xopenex. Oxygen. Follow.  #5 probable acute systolic CHF exacerbation/cardiomyopathy Patient has  been placed on low-dose IV Lasix per cardiology. Patient with mildly elevated troponins felt to be secondary to A. fib with RVR. 2-D echo with EF of 25-30% with diffuse hypokinesis and septal dyssynergy. Patient noted to have worsening hyponatremia and a such will discontinue IV Lasix for now and follow. Cardiology following and appreciate input and recommendations.  #6 chronic kidney disease stage II Stable. Follow.  #7 hypothyroidism TSH within normal limits. Continue home dose Synthroid.  #8 hyponatremia Hyponatremia has worsened this morning. Urine sodium was less than 10. Urine osmolality is low. Repeat chest x-ray. Will DC patient's IV Lasix and placed on gentle hydration and follow. Consult with nephrology.  Follow.  #9 hypertension Stable. On Cardizem.  #10 positive d-dimer Patient with hypoxia and shortness of breath. Patient with prior history of PE and DVT per daughter. VQ scan negative for PE.  #11 prophylaxis Lovenox for DVT prophylaxis.    Code Status: Full Family Communication: Updated patient and daughter at bedside. Disposition Plan: Remain inpatient.   Consultants:  Cardiology: Dr Diona BrownerMcDowell 05/28/14  Procedures:  2 d echo 05/28/14  Chest x-ray 05/27/2014    lower extremity Dopplers 05/28/2014 negative  VQ scan 05/29/2014  Antibiotics:  IV ROCEPHIN 05/27/2014>>>> 05/29/2014  IV AZITHROMYCIN 05/27/2014 >>>> 05/29/2014  Oral Augmentin 05/30/2014  HPI/Subjective: Patient states still with SOB. Patient eating breakfast.  Objective: Filed Vitals:   05/30/14 0528  BP: 108/58  Pulse: 57  Temp: 97.8 F (36.6 C)  Resp: 20    Intake/Output Summary (Last 24 hours) at 05/30/14 0945 Last data filed at 05/30/14 0528  Gross per 24 hour  Intake    650 ml  Output    400 ml  Net    250 ml   Filed Weights   05/27/14 1400  Weight: 77.565 kg (171 lb)    Exam:   General:  NAD. SOB on minimal exertion.  Cardiovascular: Irregularly  irregular  Respiratory: Some bibasilar coarse breath sounds.  Abdomen: Soft, nontender, nondistended, positive bowel sounds.  Musculoskeletal: No clubbing cyanosis or edema.  Data Reviewed: Basic Metabolic Panel:  Recent Labs Lab 05/27/14 1009 05/27/14 1536 05/29/14 0802 05/29/14 1146 05/30/14 0522  NA 138 136 122* 122* 120*  K 3.9 3.6 3.9  --  4.0  CL 100 104 89*  --  85*  CO2 --  24  GLUCOSE 134* 108* 132*  --  121*  BUN 16 17 32*  --  39*  CREATININE 1.24* 1.30* 1.48*  --  1.49*  CALCIUM 9.2 9.0 8.2*  --  8.3*  MG  --   --  1.5  --  2.0   Liver Function Tests: No results for input(s): AST, ALT, ALKPHOS, BILITOT, PROT, ALBUMIN in the last 168 hours. No results for input(s): LIPASE, AMYLASE in the last 168 hours. No results for input(s): AMMONIA in the last 168 hours. CBC:  Recent Labs Lab 05/27/14 1023 05/29/14 0802 05/30/14 0522  WBC 7.0 12.6* 9.4  NEUTROABS  --  11.2* 8.5*  HGB 12.2 12.6 12.1  HCT 43.9 38.2 36.1  MCV 95.2 93.9 91.2  PLT  --  232 222   Cardiac Enzymes:  Recent Labs Lab 05/28/14 0635 05/28/14 1924 05/29/14 0100  TROPONINI 0.04* 0.05* 0.07*   BNP (last 3 results)  Recent Labs  05/30/14 0522  BNP 1025.0*    ProBNP (last 3 results) No results for input(s): PROBNP in the last 8760 hours.  CBG: No results for input(s): GLUCAP in the last 168 hours.  Recent Results (from the past 240 hour(s))  Culture, expectorated sputum-assessment     Status: None   Collection Time: 05/27/14  8:49 PM  Result Value Ref Range Status   Specimen Description SPUTUM  Final   Special Requests NONE  Final   Sputum evaluation   Final    THIS SPECIMEN IS ACCEPTABLE. RESPIRATORY CULTURE REPORT TO FOLLOW. PERFORMED AT [facility]    Report Status 05/27/2014 FINAL  Final  Culture, respiratory (NON-Expectorated)     Status: None (Preliminary result)   Collection Time: 05/27/14  8:49 PM  Result Value Ref Range Status   Specimen Description SPUTUM  EXPECTORATED  Final   Special Requests NONE  Final   Gram Stain   Final    FEW WBC PRESENT,BOTH PMN AND MONONUCLEAR FEW SQUAMOUS EPITHELIAL CELLS PRESENT FEW GRAM POSITIVE COCCI IN PAIRS RARE GRAM NEGATIVE RODS Performed at [facility]    Culture   Final    NORMAL OROPHARYNGEAL FLORA Performed at [facility]    Report Status PENDING  Incomplete  Culture, Urine     Status: None   Collection Time: 05/27/14  8:50 PM  Result Value Ref Range Status   Specimen Description URINE, CLEAN CATCH  Final   Special Requests NONE  Final   Colony Count NO GROWTH Performed at [facility]   Final   Culture NO GROWTH Performed at [facility]   Final   Report Status 05/29/2014 FINAL  Final  Rapid strep screen     Status: None   Collection Time: 05/28/14 12:25 PM  Result Value Ref Range Status   Streptococcus, Group A Screen (Direct) NEGATIVE NEGATIVE Final    Comment: (NOTE) A Rapid Antigen test may result negative if the antigen level in the sample is below the detection level of this test. The  FDA has not cleared this test as a stand-alone test therefore the rapid antigen negative result has reflexed to a Group A Strep culture.   Culture, Group A Strep     Status: None (Preliminary result)   Collection Time: 05/28/14 12:25 PM  Result Value Ref Range Status   Specimen Description THROAT  Final   Special Requests NONE  Final   Culture   Final    NO SUSPICIOUS COLONIES, CONTINUING TO HOLD Performed at [facility]    Report Status PENDING  Incomplete     Studies: Nm Pulmonary Perf And Vent  05/29/2014   CLINICAL DATA:  Shortness of breath  EXAM: NUCLEAR MEDICINE VENTILATION - PERFUSION LUNG SCAN  TECHNIQUE: Ventilation images were obtained in multiple projections using inhaled aerosol technetium 99 M DTPA. Perfusion images were obtained in multiple projections after intravenous injection of Tc-62m MAA.  RADIOPHARMACEUTICALS:  42 mCi Tc-30m DTPA  aerosol and 6 mCi Tc-32m MAA  COMPARISON:  05/27/2014.  FINDINGS: Ventilation: Ventilation images show some patchy changes likely related to COPD. Central trapping of tracer is noted as well. No large ventilation defect is noted.  Perfusion: Overlying defect from the patient's arms is noted. No large segmental perfusion defect to suggest pulmonary embolism is identified. Changes consistent with a small pleural effusion seen on recent chest x-ray are noted.  IMPRESSION: Changes consistent with COPD in a right-sided pleural effusion.  No definitive pulmonary embolism is seen.   Electronically Signed   By: Alcide Clever M.D.   On: 05/29/2014 12:03   US Venous Img Lower Unilateral Right  05/28/2014   CLINICAL DATA:  Right leg swelling  EXAM: RIGHT LOWER EXTREMITY VENOUS DUPLEX ULTRASOUND  TECHNIQUE: Doppler venous assessment of the right lower extremity deep venous system was performed, including characterization of spectral flow, compressibility, and phasicity.  COMPARISON:  None.  FINDINGS: There is complete compressibility of the right common femoral, femoral, and popliteal veins. Doppler analysis demonstrates respiratory phasicity and augmentation of flow upon calf compression. No obvious calf vein thrombosis.  IMPRESSION: No evidence of right lower extremity DVT.   Electronically Signed   By: Jolaine Click M.D.   On: 05/28/2014 10:16    Scheduled Meds: . aspirin EC  81 mg Oral Daily  . azithromycin  500 mg Oral q1800  . brinzolamide  1 drop Right Eye 2 times per day  . budesonide-formoterol  2 puff Inhalation BID  . cefTRIAXone (ROCEPHIN)  IV  1 g Intravenous Q24H  . diltiazem  60 mg Oral 4 times per day  . enoxaparin (LOVENOX) injection  30 mg Subcutaneous Q24H  . guaiFENesin  1,200 mg Oral BID  . ipratropium  0.5 mg Nebulization Q4H  . latanoprost  1 drop Right Eye QHS  . levalbuterol  0.63 mg Nebulization Q4H  . levothyroxine  75 mcg Oral QAC breakfast  . loratadine  10 mg Oral Daily  .  LORazepam  0.5 mg Oral QHS  . methylPREDNISolone (SOLU-MEDROL) injection  60 mg Intravenous 3 times per day  . pantoprazole  40 mg Oral Daily  . sertraline  25 mg Oral Daily   Continuous Infusions: . sodium chloride      Principal Problem:   Hypoxia Active Problems:   CAP (community acquired pneumonia)   Asthma with acute exacerbation   Acute bronchitis   New onset a-fib   Hypertension   Hyperlipemia   Hypothyroid   CKD (chronic kidney disease)   Asthma, chronic   Hyponatremia  Positive D dimer   Cardiomyopathy   Acute exacerbation of CHF (congestive heart failure); Probable   COPD exacerbation    Time spent: 40 mins    [facility] MD [facility] Pager [phone]. If 7PM-7AM, please contact night-coverage at [email or web address], password [facility] 05/30/2014, 9:45 AM  LOS: 3 days

## 2014-05-30 NOTE — Progress Notes (Signed)
Notified MD that patient sounded wet when assessing her lung sounds. Patient also had increase in wheezing. Respiratory notified to do a breathing treatment. Will continue to monitor patient.

## 2014-05-30 NOTE — Progress Notes (Signed)
OT Cancellation Note  Patient Details Name: Daisy Scott MRN: [id number] DOB: Mar 24, 1919   Cancelled Treatment:    Reason Eval/Treat Not Completed: OT screened, no needs identified, will sign off. Patient presenting at Mod I level which is baseline. No further need for OT at this time.   Daisy Scott, OTR/L,CBIS  [phone]  05/30/2014, 7:51 AM

## 2014-05-30 NOTE — Progress Notes (Signed)
Pt's legs are very swollen and she sound more wet than she did this morning

## 2014-05-30 NOTE — Progress Notes (Signed)
Notified by RT that patient had received breathing treatments and they have not helped the patients breathing. Upon assessment patient was having some labored breathing and increased wheezing from pervious assessment @1505 . Patients bilateral lung sounds were very congested. Patients O2 level on 3L was 95%. MD was notified. New orders were placed.

## 2014-05-30 NOTE — Progress Notes (Signed)
Consulting cardiologist: Dr. Jonelle Sidle  Seen for followup: Atrial fibrillation, cardiomyopathy  Subjective:    Patient somewhat less short of breath, cough improved, no chest pain or palpitations.  Objective:   Temp:  [97.4 F (36.3 C)-97.8 F (36.6 C)] 97.8 F (36.6 C) (02/11 0528) Pulse Rate:  [57-125] 57 (02/11 0528) Resp:  [20] 20 (02/11 0528) BP: (108-138)/(58-98) 108/58 mmHg (02/11 0528) SpO2:  [85 %-95 %] 94 % (02/11 0702) Last BM Date: 05/28/14  Filed Weights   05/27/14 1400  Weight: 171 lb (77.565 kg)    Intake/Output Summary (Last 24 hours) at 05/30/14 1005 Last data filed at 05/30/14 0528  Gross per 24 hour  Intake    650 ml  Output    400 ml  Net    250 ml    Telemetry: Persistent atrial fibrillation with IVCD.  Exam:  General: Elderly woman, no distress.  Lungs: Crease at bases with expiratory wheezes and rhonchi.  Cardiac: Indistinct PMI, irregularly irregular.  Extremities: No pitting.   Lab Results:  Basic Metabolic Panel:  Recent Labs Lab 05/27/14 1536 05/29/14 0802 05/29/14 1146 05/30/14 0522  NA 136 122* 122* 120*  K 3.6 3.9  --  4.0  CL 104 89*  --  85*  CO2 24 24  --  24  GLUCOSE 108* 132*  --  121*  BUN 17 32*  --  39*  CREATININE 1.30* 1.48*  --  1.49*  CALCIUM 9.0 8.2*  --  8.3*  MG  --  1.5  --  2.0    CBC:  Recent Labs Lab 05/27/14 1023 05/29/14 0802 05/30/14 0522  WBC 7.0 12.6* 9.4  HGB 12.2 12.6 12.1  HCT 43.9 38.2 36.1  MCV 95.2 93.9 91.2  PLT  --  232 222    Cardiac Enzymes:  Recent Labs Lab 05/28/14 0635 05/28/14 1924 05/29/14 0100  TROPONINI 0.04* 0.05* 0.07*     Medications:   Scheduled Medications: . aspirin EC  81 mg Oral Daily  . azithromycin  500 mg Oral q1800  . brinzolamide  1 drop Right Eye 2 times per day  . budesonide-formoterol  2 puff Inhalation BID  . cefTRIAXone (ROCEPHIN)  IV  1 g Intravenous Q24H  . diltiazem  60 mg Oral 4 times per day  . enoxaparin  (LOVENOX) injection  30 mg Subcutaneous Q24H  . guaiFENesin  1,200 mg Oral BID  . ipratropium  0.5 mg Nebulization Q4H  . latanoprost  1 drop Right Eye QHS  . levalbuterol  0.63 mg Nebulization Q4H  . levothyroxine  75 mcg Oral QAC breakfast  . loratadine  10 mg Oral Daily  . LORazepam  0.5 mg Oral QHS  . methylPREDNISolone (SOLU-MEDROL) injection  60 mg Intravenous 3 times per day  . pantoprazole  40 mg Oral Daily  . sertraline  25 mg Oral Daily     Infusions: . sodium chloride       PRN Medications:  acetaminophen **OR** acetaminophen, benzonatate, guaiFENesin-dextromethorphan, ipratropium, levalbuterol, menthol-cetylpyridinium, ondansetron **OR** ondansetron (ZOFRAN) IV   Assessment:   1. Persistent atrial fibrillation, newly documented, possible PAF based on history review. Plan at this time is rate control, anticoagulation has been declined after discussion noted previously with daughter. She continues on aspirin and Cardizem.  2. Cardiomyopathy, LVEF 25-30% with diffuse hypokinesis and near akinesis in the mid to apical anteroseptal and mid to basal inferolateral wall. We initially started beta blocker instead of calcium channel blocker, however wheezing  was worse. Lasix has been discontinued with progressing hyponatremia.  3. Hyponatremia, sodium down to 120. Normal at presentation, Lasix was a new medication and has now been discontinued. Normal saline started by primary team.  4. Bronchitis and asthma exacerbation, currently on antibiotics and steroids per primary team.   Plan/Discussion:    Discussed with daughter at bedside. Plan to continue aspirin, convert short acting Cardizem to Cardizem CD 240 mg daily, agree with holding off Lasix for now but would try to limit excess fluids. Blood pressure trending down, will hold off trying to add additional afterload reducing agents at this time.   Jonelle SidleSamuel G. McDowell, M.D., F.A.C.C.

## 2014-05-30 NOTE — Progress Notes (Signed)
Physical Therapy Treatment Patient Details Name: Daisy Scott MRN: [id number] DOB: 1918-09-01 Today's Date: 05/30/2014    History of Present Illness Daisy Scott is a 81 y.o. female with prior h/o hypertension, CKD comes inf or cough with productive sputum and congestion for one week. She also reports some chills, no fevers. She was seen Dr Kathi Der office and was sent to the hospital for admission for evaluation and management of CAP. cxr showed some bilateral effusions and mild congestion.  ON arrival to the floor, pt was wheezing bilaterally. Her labs revealed baseline CKD. She was admitted to medical service for management of CAP AND ASTHMA EXACERBATION.     PT Comments    Daisy Scott was seen today in bed with breathing treatment on, awake, alert, oriented, cooperative and able to follow directions well. Patient O2 saturation fell to 87% after gait training on supplemental O2 and bounce back to 92% after taking sitting rest period; respiratory therapist in room after gait and stated to increase O2 to 3L to assist in recovery. Patient tolerate exercise well in supine and complaints of no increase pain or signs of intolerance. Observable increase in muscle motor control when performing therapeutic exercises in supine. Current functional status: Bed mobility Modified Independent. Transfers and Gait with Min Guard assist for safety. Patient reports pain at the back and arm with decrease activity tolerance and fatigue when performing gait training and unable to maintain upright position in standing. Patient has increased distance walk today from 75 feet yesterday to 82 feet. Patient is near baseline and will be d/c from acute PT services at this time.  If pt has regression in mobility skills, will need new PT order to assess.    Follow Up Recommendations  No PT follow up     Equipment Recommendations  None recommended by PT    Recommendations for Other Services none     Precautions / Restrictions Precautions Precautions: Fall Restrictions Weight Bearing Restrictions: No    Mobility  Bed Mobility Overal bed mobility: Modified Independent    General bed mobility comments: Required seated rest break at EOB secondary to SOB after movement.    Transfers Overall transfer level: Needs assistance Equipment used: Rolling walker (2 wheeled) Transfers: Sit to/from Stand Sit to Stand: Min guard  General transfer comment: Patient is impulsive and in a hurry, close guarding to prevent falls  Ambulation/Gait Ambulation/Gait assistance: Min guard Ambulation Distance (Feet): 82 Feet Assistive device: Rolling walker (2 wheeled) Gait Pattern/deviations: WFL(Within Functional Limits)   Gait velocity interpretation: at or above normal speed for age/gender General Gait Details: Continuous VC for technique as pt alternated between standing too far or too close to RW.  O2 sats after gait 87% while on 2L O2 bounce back to 92% after rest period; required rest break in bed to increase to 92%       Cognition Arousal/Alertness: Awake/alert Behavior During Therapy: WFL for tasks assessed/performed Overall Cognitive Status: Within Functional Limits for tasks assessed     Exercises Total Joint Exercises Ankle Circles/Pumps: AROM;Both;Supine;10 reps Heel Slides: AROM;Both;Supine;10 reps        Pertinent Vitals/Pain Pain Assessment: No/denies pain           PT Goals (current goals can now be found in the care plan section) D/C to (I) program.     Frequency  Min 2X/week    PT Plan D/c to (I) program.        End of Session Equipment Utilized During Treatment: Gait  belt;Oxygen (2 L/min) Activity Tolerance: Patient limited by fatigue;Patient tolerated treatment well Patient left: in bed;with call bell/phone within reach;with family/visitor present     Time: [id number] PT Time Calculation (min) (ACUTE ONLY): 28 min  Charges:  $Gait Training:  8-22 mins $Therapeutic Exercise: 8-22 mins                        Ikeem Cleckler A 05/30/2014, 3:58 PM

## 2014-05-31 ENCOUNTER — Inpatient Hospital Stay (HOSPITAL_COMMUNITY): Payer: Medicare Other

## 2014-05-31 DIAGNOSIS — I4891 Unspecified atrial fibrillation: Secondary | ICD-10-CM

## 2014-05-31 DIAGNOSIS — E871 Hypo-osmolality and hyponatremia: Secondary | ICD-10-CM

## 2014-05-31 DIAGNOSIS — I509 Heart failure, unspecified: Secondary | ICD-10-CM

## 2014-05-31 DIAGNOSIS — J9600 Acute respiratory failure, unspecified whether with hypoxia or hypercapnia: Secondary | ICD-10-CM | POA: Diagnosis present

## 2014-05-31 DIAGNOSIS — J209 Acute bronchitis, unspecified: Secondary | ICD-10-CM

## 2014-05-31 LAB — BLOOD GAS, ARTERIAL
Acid-base deficit: 4.1 mmol/L — ABNORMAL HIGH (ref 0.0–2.0)
Bicarbonate: 21 mEq/L (ref 20.0–24.0)
DRAWN BY: 25788
O2 CONTENT: 4 L/min
O2 Saturation: 92.7 %
PCO2 ART: 42.5 mmHg (ref 35.0–45.0)
PH ART: 7.315 — AB (ref 7.350–7.450)
Patient temperature: 37
TCO2: 19.3 mmol/L (ref 0–100)
pO2, Arterial: 71.2 mmHg — ABNORMAL LOW (ref 80.0–100.0)

## 2014-05-31 LAB — BASIC METABOLIC PANEL
Anion gap: 14 (ref 5–15)
Anion gap: 8 (ref 5–15)
Anion gap: 9 (ref 5–15)
BUN: 42 mg/dL — ABNORMAL HIGH (ref 6–23)
BUN: 42 mg/dL — ABNORMAL HIGH (ref 6–23)
BUN: 42 mg/dL — ABNORMAL HIGH (ref 6–23)
CALCIUM: 8.4 mg/dL (ref 8.4–10.5)
CALCIUM: 7.8 mg/dL — AB (ref 8.4–10.5)
CALCIUM: 7.7 mg/dL — AB (ref 8.4–10.5)
CO2: 23 mmol/L (ref 19–32)
CO2: 22 mmol/L (ref 19–32)
CO2: 21 mmol/L (ref 19–32)
CREATININE: 1.53 mg/dL — AB (ref 0.50–1.10)
Chloride: 81 mmol/L — ABNORMAL LOW (ref 96–112)
Chloride: 85 mmol/L — ABNORMAL LOW (ref 96–112)
Chloride: 86 mmol/L — ABNORMAL LOW (ref 96–112)
Creatinine, Ser: 1.55 mg/dL — ABNORMAL HIGH (ref 0.50–1.10)
Creatinine, Ser: 1.48 mg/dL — ABNORMAL HIGH (ref 0.50–1.10)
GFR calc Af Amer: 32 mL/min — ABNORMAL LOW (ref >90)
GFR calc Af Amer: 33 mL/min — ABNORMAL LOW (ref >90)
GFR calc Af Amer: 34 mL/min — ABNORMAL LOW (ref >90)
GFR calc non Af Amer: 28 mL/min — ABNORMAL LOW (ref >90)
GFR calc non Af Amer: 28 mL/min — ABNORMAL LOW (ref >90)
GFR, EST NON AFRICAN AMERICAN: 29 mL/min — AB (ref >90)
GLUCOSE: 124 mg/dL — AB (ref 70–99)
Glucose, Bld: 132 mg/dL — ABNORMAL HIGH (ref 70–99)
Glucose, Bld: 139 mg/dL — ABNORMAL HIGH (ref 70–99)
POTASSIUM: 4.3 mmol/L (ref 3.5–5.1)
POTASSIUM: 4.3 mmol/L (ref 3.5–5.1)
POTASSIUM: 4.2 mmol/L (ref 3.5–5.1)
SODIUM: 118 mmol/L — AB (ref 135–145)
SODIUM: 115 mmol/L — AB (ref 135–145)
SODIUM: 116 mmol/L — AB (ref 135–145)

## 2014-05-31 LAB — CBC
HEMATOCRIT: 36.4 % (ref 36.0–46.0)
HEMOGLOBIN: 12.3 g/dL (ref 12.0–15.0)
MCH: 30.4 pg (ref 26.0–34.0)
MCHC: 33.8 g/dL (ref 30.0–36.0)
MCV: 89.9 fL (ref 78.0–100.0)
Platelets: 290 10*3/uL (ref 150–400)
RBC: 4.05 MIL/uL (ref 3.87–5.11)
RDW: 14.2 % (ref 11.5–15.5)
WBC: 9.2 10*3/uL (ref 4.0–10.5)

## 2014-05-31 LAB — MRSA PCR SCREENING: MRSA BY PCR: NEGATIVE

## 2014-05-31 LAB — SODIUM, URINE, RANDOM

## 2014-05-31 LAB — URINE CULTURE
Colony Count: NO GROWTH
Culture: NO GROWTH

## 2014-05-31 LAB — MAGNESIUM: Magnesium: 1.9 mg/dL (ref 1.5–2.5)

## 2014-05-31 LAB — BRAIN NATRIURETIC PEPTIDE: B NATRIURETIC PEPTIDE 5: 939 pg/mL — AB (ref 0.0–100.0)

## 2014-05-31 MED ORDER — NITROGLYCERIN IN D5W 200-5 MCG/ML-% IV SOLN
INTRAVENOUS | Status: AC
  Administered 2014-05-31: 5 ug
  Filled 2014-05-31: qty 250

## 2014-05-31 MED ORDER — LEVOFLOXACIN IN D5W 750 MG/150ML IV SOLN
750.0000 mg | INTRAVENOUS | Status: DC
  Administered 2014-05-31 – 2014-06-02 (×2): 750 mg via INTRAVENOUS
  Filled 2014-05-31 (×2): qty 150

## 2014-05-31 MED ORDER — FUROSEMIDE 10 MG/ML IJ SOLN
60.0000 mg | Freq: Two times a day (BID) | INTRAMUSCULAR | Status: DC
  Administered 2014-05-31 – 2014-06-01 (×3): 60 mg via INTRAVENOUS
  Filled 2014-05-31 (×3): qty 6

## 2014-05-31 MED ORDER — METHYLPREDNISOLONE SODIUM SUCC 125 MG IJ SOLR
125.0000 mg | Freq: Four times a day (QID) | INTRAMUSCULAR | Status: DC
  Administered 2014-05-31 – 2014-06-02 (×9): 125 mg via INTRAVENOUS
  Filled 2014-05-31 (×9): qty 2

## 2014-05-31 MED ORDER — IPRATROPIUM-ALBUTEROL 0.5-2.5 (3) MG/3ML IN SOLN
3.0000 mL | RESPIRATORY_TRACT | Status: DC | PRN
  Administered 2014-05-31: 3 mL via RESPIRATORY_TRACT
  Filled 2014-05-31 (×3): qty 3

## 2014-05-31 MED ORDER — LORAZEPAM 2 MG/ML IJ SOLN
0.5000 mg | Freq: Four times a day (QID) | INTRAMUSCULAR | Status: DC | PRN
  Administered 2014-05-31 – 2014-06-04 (×6): 0.5 mg via INTRAVENOUS
  Filled 2014-05-31 (×6): qty 1

## 2014-05-31 MED ORDER — CETYLPYRIDINIUM CHLORIDE 0.05 % MT LIQD
7.0000 mL | Freq: Two times a day (BID) | OROMUCOSAL | Status: DC
  Administered 2014-05-31 – 2014-06-08 (×18): 7 mL via OROMUCOSAL

## 2014-05-31 MED ORDER — CHLORHEXIDINE GLUCONATE 0.12 % MT SOLN
15.0000 mL | Freq: Two times a day (BID) | OROMUCOSAL | Status: DC
  Administered 2014-05-31 – 2014-06-08 (×17): 15 mL via OROMUCOSAL
  Filled 2014-05-31 (×17): qty 15

## 2014-05-31 MED ORDER — SODIUM CHLORIDE 0.9 % IV BOLUS (SEPSIS): 500.0000 mL | Freq: Once | INTRAVENOUS | Status: DC

## 2014-05-31 MED ORDER — HYDRALAZINE HCL 25 MG PO TABS
25.0000 mg | ORAL_TABLET | Freq: Four times a day (QID) | ORAL | Status: DC | PRN
  Administered 2014-05-31 – 2014-06-04 (×2): 25 mg via ORAL
  Filled 2014-05-31 (×2): qty 1

## 2014-05-31 MED ORDER — ACETYLCYSTEINE 20 % IN SOLN
2.0000 mL | RESPIRATORY_TRACT | Status: AC
  Administered 2014-05-31 – 2014-06-02 (×12): 2 mL via RESPIRATORY_TRACT
  Filled 2014-05-31 (×12): qty 4

## 2014-05-31 MED ORDER — NITROGLYCERIN IN D5W 200-5 MCG/ML-% IV SOLN
2.0000 ug/min | INTRAVENOUS | Status: DC
  Administered 2014-05-31: 5 ug/min via INTRAVENOUS
  Filled 2014-05-31: qty 250

## 2014-05-31 MED ORDER — LORAZEPAM 0.5 MG PO TABS
0.5000 mg | ORAL_TABLET | Freq: Once | ORAL | Status: AC
  Administered 2014-05-31: 0.5 mg via ORAL
  Filled 2014-05-31: qty 1

## 2014-05-31 NOTE — Progress Notes (Signed)
Pharmacist Heart Failure Core Measure Documentation  Assessment: Daisy Scott has an EF documented as 25-30% on 05/28/14 by echo.  Rationale: Heart failure patients with left ventricular systolic dysfunction (LVSD) and an EF < 40% should be prescribed an angiotensin converting enzyme inhibitor (ACEI) or angiotensin receptor blocker (ARB) at discharge unless a contraindication is documented in the medical record.  This patient is not currently on an ACEI or ARB for HF.  This note is being placed in the record in order to provide documentation that a contraindication to the use of these agents is present for this encounter.  ACE Inhibitor or Angiotensin Receptor Blocker is contraindicated (specify all that apply)  []   ACEI allergy AND ARB allergy []   Angioedema []   Moderate or severe aortic stenosis []   Hyperkalemia []   Hypotension []   Renal artery stenosis [x]   Worsening renal function, preexisting renal disease or dysfunction   Valrie HartHall, Sunshyne Horvath A 05/31/2014 1:44 PM

## 2014-05-31 NOTE — Progress Notes (Signed)
Hospitalist paged with elevated blood pressure of 146/127 on dinamap.  Attempted manual but unable to ausculatate due to patient expiratory wheeze and congested cough. Hospitalist retuturned call, Ativan  And Hydralazine ordered and given.  Patient restless, caregiver informed of physician's orders.

## 2014-05-31 NOTE — Care Management Utilization Note (Signed)
UR completed 

## 2014-05-31 NOTE — Progress Notes (Signed)
Dr. Janee Mornhompson in to see patient earlier new orders given . ABGs and Chest x-ray completed.

## 2014-05-31 NOTE — Progress Notes (Signed)
[facility] BE[facility] AND EXAMINED PT.

## 2014-05-31 NOTE — Progress Notes (Signed)
CRITICAL VALUE ALERT  Critical value received:  Na 115  Date of notification:  05/30/2014  Time of notification:  2135  Critical value read back:Yes.    Nurse who received alert:  Ty HiltsKaren Lex Linhares  MD notified (1st page):  S. Camila Lisman  Time of first page:  2138 MD notified (2nd page):  Time of second page:  Responding MD:  Jennye BoroughsS. Osman  Time MD responded:  2142

## 2014-05-31 NOTE — Progress Notes (Signed)
Patient transferred to ICU room 6. Notified patient's daughter Mrs. Belton via telephone patient moved to ICU.

## 2014-05-31 NOTE — Progress Notes (Signed)
Received phone call again from Hospitalist to start gentle hydration of NS @ 1650ml/hr.

## 2014-05-31 NOTE — Progress Notes (Signed)
Hospitalist returned call.  No changes R/T panic sodium.

## 2014-05-31 NOTE — Consult Note (Signed)
Reason for Consult: Hyponatremia Referring Physician: [name] is an 81 y.o. female.  HPI: She is a patient who has history of hypertension, systolic dysfunction, chronic renal failure presently admitted with complaints of congestion, cough and difficulty breathing. When she was evaluated patient was told to have exacerbation of her COPD/pneumonia and possible CHF. Patient was managed on the floor and presently brought to intensive care unit because of worsening of her difficulty in breathing. After admission her sodium which was a normal range was declining very slowly to present level of 115. At this moment patient seems to be in severe respiratory distress and very difficult to get any history or evaluate her condition. Patient barely answer questions in between her breathing.  Past Medical History  Diagnosis Date  . Essential hypertension   . Hypothyroidism   . Asthma   . Hyperlipidemia   . Gout   . CKD (chronic kidney disease) stage 3, GFR 30-59 ml/min   . Ascending aorta dilation     4.2 cm 2011   . Atrial fibrillation     Documented February 2016 - question paroxysmal     Past Surgical History  Procedure Laterality Date  . Rotator cuff repair      Family History  Problem Relation Age of Onset  . Family history unknown: Yes    Social History:  reports that she has never smoked. She has never used smokeless tobacco. She reports that she does not drink alcohol or use illicit drugs.  Allergies:  Allergies  Allergen Reactions  . Lisinopril Other (See Comments)    Side of face dropped.  Looked like patient had had a stroke.  . Sulfa Antibiotics     Possible reaction- vomited And angio edema  . Ciprofloxacin Nausea Only  . Macrobid [Nitrofurantoin Macrocrystal] Nausea Only    Medications: I have reviewed the patient's current medications.  Results for orders placed or performed during the hospital encounter of 05/27/14 (from the past 48 hour(s))   Basic metabolic panel     Status: Abnormal   Collection Time: 05/30/14  5:22 AM  Result Value Ref Range   Sodium 120 (L) 135 - 145 mmol/L   Potassium 4.0 3.5 - 5.1 mmol/L   Chloride 85 (L) 96 - 112 mmol/L   CO2 24 19 - 32 mmol/L   Glucose, Bld 121 (H) 70 - 99 mg/dL   BUN 39 (H) 6 - 23 mg/dL   Creatinine, Ser 1.49 (H) 0.50 - 1.10 mg/dL   Calcium 8.3 (L) 8.4 - 10.5 mg/dL   GFR calc non Af Amer 29 (L) >90 mL/min   GFR calc Af Amer 34 (L) >90 mL/min    Comment: (NOTE) The eGFR has been calculated using the CKD EPI equation. This calculation has not been validated in all clinical situations. eGFR's persistently <90 mL/min signify possible Chronic Kidney Disease.    Anion gap 11 5 - 15  CBC with Differential/Platelet     Status: Abnormal   Collection Time: 05/30/14  5:22 AM  Result Value Ref Range   WBC 9.4 4.0 - 10.5 K/uL   RBC 3.96 3.87 - 5.11 MIL/uL   Hemoglobin 12.1 12.0 - 15.0 g/dL   HCT 36.1 36.0 - 46.0 %   MCV 91.2 78.0 - 100.0 fL   MCH 30.6 26.0 - 34.0 pg   MCHC 33.5 30.0 - 36.0 g/dL   RDW 14.3 11.5 - 15.5 %   Platelets 222 150 - 400 K/uL   Neutrophils  Relative % 90 (H) 43 - 77 %   Neutro Abs 8.5 (H) 1.7 - 7.7 K/uL   Lymphocytes Relative 6 (L) 12 - 46 %   Lymphs Abs 0.5 (L) 0.7 - 4.0 K/uL   Monocytes Relative 4 3 - 12 %   Monocytes Absolute 0.4 0.1 - 1.0 K/uL   Eosinophils Relative 0 0 - 5 %   Eosinophils Absolute 0.0 0.0 - 0.7 K/uL   Basophils Relative 0 0 - 1 %   Basophils Absolute 0.0 0.0 - 0.1 K/uL  Magnesium     Status: None   Collection Time: 05/30/14  5:22 AM  Result Value Ref Range   Magnesium 2.0 1.5 - 2.5 mg/dL  Brain natriuretic peptide     Status: Abnormal   Collection Time: 05/30/14  5:22 AM  Result Value Ref Range   B Natriuretic Peptide 1025.0 (H) 0.0 - 100.0 pg/mL  Basic metabolic panel     Status: Abnormal   Collection Time: 05/30/14  8:12 PM  Result Value Ref Range   Sodium 115 (LL) 135 - 145 mmol/L    Comment: CRITICAL RESULT CALLED TO,  READ BACK BY AND VERIFIED WITH: WOODS,K ON 05/30/14 AT 2110 BY LOY,C    Potassium 4.6 3.5 - 5.1 mmol/L   Chloride 84 (L) 96 - 112 mmol/L   CO2 22 19 - 32 mmol/L   Glucose, Bld 142 (H) 70 - 99 mg/dL   BUN 42 (H) 6 - 23 mg/dL   Creatinine, Ser 1.66 (H) 0.50 - 1.10 mg/dL   Calcium 8.0 (L) 8.4 - 10.5 mg/dL   GFR calc non Af Amer 25 (L) >90 mL/min   GFR calc Af Amer 30 (L) >90 mL/min    Comment: (NOTE) The eGFR has been calculated using the CKD EPI equation. This calculation has not been validated in all clinical situations. eGFR's persistently <90 mL/min signify possible Chronic Kidney Disease.    Anion gap 9 5 - 15  Basic metabolic panel     Status: Abnormal   Collection Time: 05/31/14  5:18 AM  Result Value Ref Range   Sodium 118 (LL) 135 - 145 mmol/L    Comment: CRITICAL RESULT CALLED TO, READ BACK BY AND VERIFIED WITH: CHAPPELL,R AT 7:40AM ON 05/31/14 BY FESTERMAN,C    Potassium 4.3 3.5 - 5.1 mmol/L   Chloride 81 (L) 96 - 112 mmol/L   CO2 23 19 - 32 mmol/L   Glucose, Bld 132 (H) 70 - 99 mg/dL   BUN 42 (H) 6 - 23 mg/dL   Creatinine, Ser 1.55 (H) 0.50 - 1.10 mg/dL   Calcium 8.4 8.4 - 10.5 mg/dL   GFR calc non Af Amer 28 (L) >90 mL/min   GFR calc Af Amer 32 (L) >90 mL/min    Comment: (NOTE) The eGFR has been calculated using the CKD EPI equation. This calculation has not been validated in all clinical situations. eGFR's persistently <90 mL/min signify possible Chronic Kidney Disease.    Anion gap 14 5 - 15  CBC     Status: None   Collection Time: 05/31/14  5:18 AM  Result Value Ref Range   WBC 9.2 4.0 - 10.5 K/uL   RBC 4.05 3.87 - 5.11 MIL/uL   Hemoglobin 12.3 12.0 - 15.0 g/dL   HCT 36.4 36.0 - 46.0 %   MCV 89.9 78.0 - 100.0 fL   MCH 30.4 26.0 - 34.0 pg   MCHC 33.8 30.0 - 36.0 g/dL   RDW 14.2 11.5 -  15.5 %   Platelets 290 150 - 400 K/uL    Comment: DELTA CHECK NOTED  Brain natriuretic peptide     Status: Abnormal   Collection Time: 05/31/14  5:18 AM  Result Value  Ref Range   B Natriuretic Peptide 939.0 (H) 0.0 - 100.0 pg/mL  Magnesium     Status: None   Collection Time: 05/31/14  5:18 AM  Result Value Ref Range   Magnesium 1.9 1.5 - 2.5 mg/dL  Blood gas, arterial     Status: Abnormal   Collection Time: 05/31/14  8:07 AM  Result Value Ref Range   O2 Content 4.0 L/min   pH, Arterial 7.315 (L) 7.350 - 7.450   pCO2 arterial 42.5 35.0 - 45.0 mmHg   pO2, Arterial 71.2 (L) 80.0 - 100.0 mmHg   Bicarbonate 21.0 20.0 - 24.0 mEq/L   TCO2 19.3 0 - 100 mmol/L   Acid-base deficit 4.1 (H) 0.0 - 2.0 mmol/L   O2 Saturation 92.7 %   Patient temperature 37.0    Collection site RIGHT RADIAL    Drawn by [id number]    Sample type ARTERIAL    Allens test (pass/fail) PASS PASS  MRSA PCR Screening     Status: None   Collection Time: 05/31/14 10:04 AM  Result Value Ref Range   MRSA by PCR NEGATIVE NEGATIVE    Comment:        The GeneXpert MRSA Assay (FDA approved for NASAL specimens only), is one component of a comprehensive MRSA colonization surveillance program. It is not intended to diagnose MRSA infection nor to guide or monitor treatment for MRSA infections.   Basic metabolic panel     Status: Abnormal   Collection Time: 05/31/14  3:15 PM  Result Value Ref Range   Sodium 115 (LL) 135 - 145 mmol/L    Comment: CRITICAL RESULT CALLED TO, READ BACK BY AND VERIFIED WITH: GRAY,M ON 05/31/14 AT 1605 BY LOY,C    Potassium 4.3 3.5 - 5.1 mmol/L   Chloride 85 (L) 96 - 112 mmol/L   CO2 22 19 - 32 mmol/L   Glucose, Bld 139 (H) 70 - 99 mg/dL   BUN 42 (H) 6 - 23 mg/dL   Creatinine, Ser 1.53 (H) 0.50 - 1.10 mg/dL   Calcium 7.8 (L) 8.4 - 10.5 mg/dL   GFR calc non Af Amer 28 (L) >90 mL/min   GFR calc Af Amer 33 (L) >90 mL/min    Comment: (NOTE) The eGFR has been calculated using the CKD EPI equation. This calculation has not been validated in all clinical situations. eGFR's persistently <90 mL/min signify possible Chronic Kidney Disease.    Anion gap 8 5 - 15     Dg Chest 2 View  05/30/2014   CLINICAL DATA:  Cough and shortness of Breath progressing over the past 4 days.  EXAM: CHEST  2 VIEW  COMPARISON:  05/27/2014  FINDINGS: The heart is enlarged. There is tortuosity, ectasia and calcification of the thoracic aorta. New bibasilar densities suspicious for pneumonia. No pulmonary edema or pleural effusion.  IMPRESSION: Suspect new bibasilar infiltrates.   Electronically Signed   By: Marijo Sanes M.D.   On: 05/30/2014 15:52   Dg Chest Port 1 View  05/31/2014   CLINICAL DATA:  Shortness of breath.  EXAM: PORTABLE CHEST - 1 VIEW  COMPARISON:  05/30/2014 and 05/27/2014.  FINDINGS: There appears to be increased densities in the right hilum and medial right lung base. Heart size is upper limits of  normal but minimally changed. Upper lungs appear to be clear.  IMPRESSION: Questionable densities in the right hilum and right lower lung region. Foci of airspace disease or infection cannot be excluded. Recommend continued follow-up.   Electronically Signed   By: Markus Daft M.D.   On: 05/31/2014 08:20    Review of Systems  Respiratory: Positive for shortness of breath.   Cardiovascular: Positive for orthopnea. Negative for chest pain.   Blood pressure 106/82, pulse 79, temperature 98 F (36.7 C), temperature source Oral, resp. rate 17, height 5' 4" (1.626 m), weight 85 kg (187 lb 6.3 oz), SpO2 97 %. Physical Exam  Constitutional: She appears distressed.  Eyes: No scleral icterus.  Neck: JVD present.  Cardiovascular: Normal rate and regular rhythm.   Respiratory: She is in respiratory distress. She has wheezes. She has rales.  GI: She exhibits no distension. There is no tenderness.  Musculoskeletal: She exhibits edema.  trace  Neurological: She is alert.    Assessment/Plan: Problem #1 hyponatremia: Seems to be acute. Her sodium was 136 on 05/27/14. Decreased to 122 on 05/29/14. Her urine sodium on 05/29/2014 was less than 10 with urine osmolality of 207.  Since then her serum sodium decline to 120 then 115 and presently 115. Serum osmolality was 287 that was done before she was noted to have hyponatremia hence very difficult to interpret. At this moment seems to be secondary to hypervolemic hyponatremia associated with intravascular volume depletion and replacement of hypotonic fluid. At this moment very hard to evaluate her mental status because of respiratory distress. Problem #2 renal failure: Chronic Problem #3 respiratory difficulty: Presently patient seems to be very short-winded, elevated JVD, bilateral wheezing. Possibly from fluid overload and exacerbation of COPD. Problem #4 history of hypothyroidism Problem #5 history of comment acquired pneumonia Problem #6 history of systolic dysfunction with low ejection fraction. Plan: We'll check urine sodium, osmolality and serum osmolality. We'll start patient on Lasix 60 mg IV twice a day Decrease her IV fluid to 75 mL per hour We'll put patient on free water restriction. Patient will benefit from being on BiPAP.    , S 05/31/2014, 6:26 PM

## 2014-05-31 NOTE — Progress Notes (Addendum)
Consulting cardiologist: Dr. Jonelle Sidle  Seen for followup: Atrial fibrillation, cardiomyopathy  Subjective:    Recent events noted. Patient noted to have respiratory distress and hypoxia, moved to the ICU by Dr. Janee Morn. She is getting ready to start on BiPAP. Denies any chest pain to me, but is short of breath at rest and wheezing.  Objective:   Temp:  [97.7 F (36.5 C)-98.7 F (37.1 C)] 98 F (36.7 C) (02/12 0422) Pulse Rate:  [71-74] 71 (02/12 0422) Resp:  [20-26] 26 (02/12 0808) BP: (107-151)/(71-127) 151/76 mmHg (02/12 0808) SpO2:  [90 %-97 %] 90 % (02/12 0717) Weight:  [184 lb 9.6 oz (83.734 kg)] 184 lb 9.6 oz (83.734 kg) (02/12 0808) Last BM Date: 05/28/14  Filed Weights   05/27/14 1400 05/31/14 0808  Weight: 171 lb (77.565 kg) 184 lb 9.6 oz (83.734 kg)    Intake/Output Summary (Last 24 hours) at 05/31/14 0910 Last data filed at 05/31/14 0650  Gross per 24 hour  Intake 1321.17 ml  Output    400 ml  Net 921.17 ml    Telemetry: Currently sinus rhythm with frequent PACs and IVCD.  Exam:  General: Elderly woman, respiratory distress at rest. BIPAP being initiated.  Lungs: Prolonged expiratory phase with active wheezing and scattered rhonchi.  Cardiac: Indistinct PMI, irregularly regular with ectopy, no gallop.  Abdomen: Soft, decreased bowel sounds, nontender.  Extremities: Trace leg edema.   Lab Results:  Basic Metabolic Panel:  Recent Labs Lab 05/29/14 0802  05/30/14 0522 05/30/14 2012 05/31/14 0518  NA 122*  < > 120* 115* 118*  K 3.9  --  4.0 4.6 4.3  CL 89*  --  85* 84* 81*  CO2 24  --  GLUCOSE 132*  --  121* 142* 132*  BUN 32*  --  39* 42* 42*  CREATININE 1.48*  --  1.49* 1.66* 1.55*  CALCIUM 8.2*  --  8.3* 8.0* 8.4  MG 1.5  --  2.0  --  1.9  < > = values in this interval not displayed.  CBC:  Recent Labs Lab 05/29/14 0802 05/30/14 0522 05/31/14 0518  WBC 12.6* 9.4 9.2  HGB 12.6 12.1 12.3  HCT 38.2 36.1  36.4  MCV 93.9 91.2 89.9  PLT 232 222 290    Cardiac Enzymes:  Recent Labs Lab 05/28/14 0635 05/28/14 1924 05/29/14 0100  TROPONINI 0.04* 0.05* 0.07*    Imaging: EXAM: PORTABLE CHEST - 1 VIEW  COMPARISON: 05/30/2014 and 05/27/2014.  FINDINGS: There appears to be increased densities in the right hilum and medial right lung base. Heart size is upper limits of normal but minimally changed. Upper lungs appear to be clear.  IMPRESSION: Questionable densities in the right hilum and right lower lung region. Foci of airspace disease or infection cannot be excluded. Recommend continued follow-up.   Medications:   Scheduled Medications: . acetylcysteine  2 mL Nebulization Q4H  . aspirin EC  81 mg Oral Daily  . brinzolamide  1 drop Right Eye 2 times per day  . budesonide-formoterol  2 puff Inhalation BID  . diltiazem  240 mg Oral Daily  . enoxaparin (LOVENOX) injection  30 mg Subcutaneous Q24H  . guaiFENesin  1,200 mg Oral BID  . ipratropium  0.5 mg Nebulization Q4H  . latanoprost  1 drop Right Eye QHS  . levalbuterol  0.63 mg Nebulization Q4H  . levofloxacin (LEVAQUIN) IV  750 mg Intravenous Q48H  . levothyroxine  75 mcg Oral  QAC breakfast  . loratadine  10 mg Oral Daily  . LORazepam  0.5 mg Oral QHS  . methylPREDNISolone (SOLU-MEDROL) injection  125 mg Intravenous Q6H  . pantoprazole  40 mg Oral Daily  . sertraline  25 mg Oral Daily    Infusions: . sodium chloride 75 mL/hr at 05/31/14 0821    PRN Medications: acetaminophen **OR** acetaminophen, benzonatate, guaiFENesin-dextromethorphan, hydrALAZINE, levalbuterol, menthol-cetylpyridinium, ondansetron **OR** ondansetron (ZOFRAN) IV   Assessment:   1. Atrial fibrillation, newly documented, possible PAF based on history review. She has converted spontaneously to sinus rhythm with frequent PACs at this time. Presently on aspirin and Cardizem CD. Plan at this time is rate control, anticoagulation has been declined  after discussion noted previously with daughter.  2. Cardiomyopathy, LVEF 25-30% with diffuse hypokinesis and near akinesis in the mid to apical anteroseptal and mid to basal inferolateral wall. We initially started beta blocker instead of calcium channel blocker, however wheezing was worse. Lasix has been discontinued with progressing hyponatremia. She has been receiving normal saline, present chest x-ray reports findings more consistent with infiltrates then edema, although certainly there is concern about her developing volume overload with continued IV fluids.  3. Hyponatremia, sodium down to 118. Normal at presentation. Complicated picture, suspect SIADH however. She is certainly prone to volume overload as a component with her cardiomyopathy, but not clear that this is the explanation now. Problem will be how to manage her low sodium as it would be best not to push normal saline. Fluid restrict.  4. Bronchitis and asthma exacerbation, currently on antibiotics and steroids per primary team. Respiratory distress could be multifactorial. Agree with BiPAP support.  5. DNR status.   Plan/Discussion:    Patient now in ICU, BiPAP to start, also having Foley catheter placed. Plan to start her on low dose nitroglycerin infusion in case there is an ischemic component as well. Otherwise she will continue on aspirin, Lovenox, Cardizem CD, and hydralazine. Overall prognosis is poor.   Jonelle SidleSamuel G. Eudell Mcphee, M.D., F.A.C.C.

## 2014-05-31 NOTE — Progress Notes (Signed)
[facility] PROGRESS NOTE  Daisy Scott NUU:[id number] DOB: 05/02/1918 DOA: 05/27/2014 PCP: Rudi HeapMOORE, DONALD, MD  Assessment/Plan: #1 acute respiratory failure Likely multifactorial secondary to worsening acute COPD exacerbation and community-acquired pneumonia versus bronchitis in the setting of A. fib with RVR and probable acute systolic CHF exacerbation. Patient did have an elevated d-dimer and concern for pulmonary emboli. Per daughter patient with prior history of pulmonary emboli and DVT several years ago. VQ scan negative for PE. Patient was placed on low-dose diuretics per cardiology for possible cardiac asthma. Lopressor has been changed to Cardizem. Rate control. Patient currently on aspirin. Lasix have been discontinued. Repeat chest x-ray. ABG with a pH of 7.32 PCO2 of 43 PO2 of 71 bicarbonate of 21. Patient with increased work of breathing. Likely worsening COPD exacerbation. Change oral antibiotics to IV Levaquin. Place on Mucomyst. Increase Solu-Medrol to 125 mg IV every 6 hours. Chest PT. Place on BiPAP. Transfer to stepdown unit. Monitor closely.   #2 A. fib with RVR Patient with left bundle branch block by EKG with prior evidence of IVCD. A. fib with RVR could be secondary to acute respiratory tract infection. Patient's chads score is 5. Patient's daughter declines pursuing anticoagulation at this time. TSH is normal. 2-D echo with EF of 25-30%, with diffuse hypokinesis and septal dyssynergy. Lopressor has been changed to Cardizem per cardiology secondary to wheezing. Continue aspirin. Keep magnesium greater than 2. Cardiology following and appreciate input and recommendations.  #3 probable early community-acquired pneumonia versus bronchitis Patient with diffuse wheezing, rhonchorous breath sounds, increased work of breathing. Chest x-ray yesterday with increased bilateral infiltrates. Repeat chest x-ray this morning. Change oral antibiotics to IV antibiotics. Continue  nebulizer treatments. Continue Mucinex. Continue oxygen and Claritin.   #4 acute COPD exacerbation Patient with worsening shortness of breath this morning the use of accessory muscles of respiration. Patient wheezing diffusely. Patient would rhonchorous breath sounds. ABG has been obtained with a pH of 7.3 PCO2 of 43 PO2 of 71 bicarbonate of 21. Will increase IV steroids to Solu-Medrol 125 IV every 6 hours. Change oral antibiotics to IV Levaquin. Place on Mucomyst. Continue Mucinex. Oxygen. Continue Xopenex and Atrovent nebulizers. Check a chest x-ray. Will transfer to stepdown unit and place on the BiPAP.   #5 probable acute systolic CHF exacerbation/cardiomyopathy Patient was placed on low-dose IV Lasix per cardiology. Patient with mildly elevated troponins felt to be secondary to A. fib with RVR. 2-D echo with EF of 25-30% with diffuse hypokinesis and septal dyssynergy. Patient noted to have worsening hyponatremia and as such discontinued IV Lasix. Cardiology following and appreciate input and recommendations.  #6 chronic kidney disease stage II Stable. Follow.  #7 hypothyroidism TSH within normal limits. Continue home dose Synthroid.  #8 hyponatremia Hyponatremia has worsened this morning. Urine sodium was less than 10. Urine osmolality is low. Repeat chest x-ray with worsening infiltrates. IV Lasix was discontinued yesterday. Increase IV fluids to 75 mL per hour. Repeat BMET this afternoon. Nephrology consultation pending.  #9 hypertension Stable. On Cardizem.  #10 positive d-dimer Patient with hypoxia and shortness of breath. Patient with prior history of PE and DVT per daughter. VQ scan negative for PE.  #11 prophylaxis Lovenox for DVT prophylaxis.    Code Status: Full Family Communication: Updated patient, no family at bedside. Disposition Plan: Transfer to stepdown unit.   Consultants:  Cardiology: Dr Diona BrownerMcDowell 05/28/14  Procedures:  2 d echo 05/28/14  Chest x-ray  05/27/2014    lower extremity Dopplers 05/28/2014 negative  VQ  scan 05/29/2014  Antibiotics:  IV ROCEPHIN 05/27/2014>>>> 05/29/2014  IV AZITHROMYCIN 05/27/2014 >>>> 05/29/2014  Oral Augmentin 05/30/2014>>>> 05/31/2014  IV Levaquin 05/31/2014  HPI/Subjective: Patient with worsening shortness of breath. Per respiratory therapist patient with increased work of breathing. Events overnight noted.  Objective: Filed Vitals:   05/31/14 0808  BP: 151/76  Pulse:   Temp:   Resp: 26    Intake/Output Summary (Last 24 hours) at 05/31/14 0822 Last data filed at 05/31/14 0650  Gross per 24 hour  Intake 1561.17 ml  Output    900 ml  Net 661.17 ml   Filed Weights   05/27/14 1400 05/31/14 0808  Weight: 77.565 kg (171 lb) 83.734 kg (184 lb 9.6 oz)    Exam:   General:  Use of accessory muscles of respiration.  Cardiovascular: Irregularly irregular  Respiratory: Diffuse coarse breath sounds. Expiratory wheezes.Use of accessory muscles of respiration.  Abdomen: Soft, nontender, nondistended, positive bowel sounds.  Musculoskeletal: No clubbing cyanosis or edema.  Data Reviewed: Basic Metabolic Panel:  Recent Labs Lab 05/27/14 1536 05/29/14 0802 05/29/14 1146 05/30/14 0522 05/30/14 2012 05/31/14 0518  NA 136 122* 122* 120* 115* 118*  K 3.6 3.9  --  4.0 4.6 4.3  CL 104 89*  --  85* 84* 81*  CO2 24 24  --  GLUCOSE 108* 132*  --  121* 142* 132*  BUN 17 32*  --  39* 42* 42*  CREATININE 1.30* 1.48*  --  1.49* 1.66* 1.55*  CALCIUM 9.0 8.2*  --  8.3* 8.0* 8.4  MG  --  1.5  --  2.0  --   --    Liver Function Tests: No results for input(s): AST, ALT, ALKPHOS, BILITOT, PROT, ALBUMIN in the last 168 hours. No results for input(s): LIPASE, AMYLASE in the last 168 hours. No results for input(s): AMMONIA in the last 168 hours. CBC:  Recent Labs Lab 05/27/14 1023 05/29/14 0802 05/30/14 0522 05/31/14 0518  WBC 7.0 12.6* 9.4 9.2  NEUTROABS  --  11.2* 8.5*  --    HGB 12.2 12.6 12.1 12.3  HCT 43.9 38.2 36.1 36.4  MCV 95.2 93.9 91.2 89.9  PLT  --  232 222 290   Cardiac Enzymes:  Recent Labs Lab 05/28/14 0635 05/28/14 1924 05/29/14 0100  TROPONINI 0.04* 0.05* 0.07*   BNP (last 3 results)  Recent Labs  05/30/14 0522 05/31/14 0518  BNP 1025.0* 939.0*    ProBNP (last 3 results) No results for input(s): PROBNP in the last 8760 hours.  CBG: No results for input(s): GLUCAP in the last 168 hours.  Recent Results (from the past 240 hour(s))  Culture, expectorated sputum-assessment     Status: None   Collection Time: 05/27/14  8:49 PM  Result Value Ref Range Status   Specimen Description SPUTUM  Final   Special Requests NONE  Final   Sputum evaluation   Final    THIS SPECIMEN IS ACCEPTABLE. RESPIRATORY CULTURE REPORT TO FOLLOW. PERFORMED AT [facility]    Report Status 05/27/2014 FINAL  Final  Culture, respiratory (NON-Expectorated)     Status: None   Collection Time: 05/27/14  8:49 PM  Result Value Ref Range Status   Specimen Description SPUTUM EXPECTORATED  Final   Special Requests NONE  Final   Gram Stain   Final    FEW WBC PRESENT,BOTH PMN AND MONONUCLEAR FEW SQUAMOUS EPITHELIAL CELLS PRESENT FEW GRAM POSITIVE COCCI IN PAIRS RARE GRAM NEGATIVE RODS Performed at [facility]  Culture   Final    NORMAL OROPHARYNGEAL FLORA Performed at [facility]    Report Status 05/30/2014 FINAL  Final  Culture, Urine     Status: None   Collection Time: 05/27/14  8:50 PM  Result Value Ref Range Status   Specimen Description URINE, CLEAN CATCH  Final   Special Requests NONE  Final   Colony Count NO GROWTH Performed at [facility]   Final   Culture NO GROWTH Performed at [facility]   Final   Report Status 05/29/2014 FINAL  Final  Rapid strep screen     Status: None   Collection Time: 05/28/14 12:25 PM  Result Value Ref Range Status   Streptococcus, Group A Screen (Direct) NEGATIVE NEGATIVE Final     Comment: (NOTE) A Rapid Antigen test may result negative if the antigen level in the sample is below the detection level of this test. The FDA has not cleared this test as a stand-alone test therefore the rapid antigen negative result has reflexed to a Group A Strep culture.   Culture, Group A Strep     Status: None   Collection Time: 05/28/14 12:25 PM  Result Value Ref Range Status   Specimen Description THROAT  Final   Special Requests NONE  Final   Culture   Final    No Beta Hemolytic Streptococci Isolated Performed at [facility]    Report Status 05/30/2014 FINAL  Final  Culture, Urine     Status: None   Collection Time: 05/29/14  5:46 PM  Result Value Ref Range Status   Specimen Description URINE, CLEAN CATCH  Final   Special Requests NONE  Final   Colony Count NO GROWTH Performed at [facility]   Final   Culture NO GROWTH Performed at [facility]   Final   Report Status 05/31/2014 FINAL  Final     Studies: Dg Chest 2 View  05/30/2014   CLINICAL DATA:  Cough and shortness of Breath progressing over the past 4 days.  EXAM: CHEST  2 VIEW  COMPARISON:  05/27/2014  FINDINGS: The heart is enlarged. There is tortuosity, ectasia and calcification of the thoracic aorta. New bibasilar densities suspicious for pneumonia. No pulmonary edema or pleural effusion.  IMPRESSION: Suspect new bibasilar infiltrates.   Electronically Signed   By: Rudie Meyer M.D.   On: 05/30/2014 15:52   Nm Pulmonary Perf And Vent  05/29/2014   CLINICAL DATA:  Shortness of breath  EXAM: NUCLEAR MEDICINE VENTILATION - PERFUSION LUNG SCAN  TECHNIQUE: Ventilation images were obtained in multiple projections using inhaled aerosol technetium 99 M DTPA. Perfusion images were obtained in multiple projections after intravenous injection of Tc-24m MAA.  RADIOPHARMACEUTICALS:  42 mCi Tc-54m DTPA aerosol and 6 mCi Tc-81m MAA  COMPARISON:  05/27/2014.  FINDINGS: Ventilation: Ventilation images  show some patchy changes likely related to COPD. Central trapping of tracer is noted as well. No large ventilation defect is noted.  Perfusion: Overlying defect from the patient's arms is noted. No large segmental perfusion defect to suggest pulmonary embolism is identified. Changes consistent with a small pleural effusion seen on recent chest x-ray are noted.  IMPRESSION: Changes consistent with COPD in a right-sided pleural effusion.  No definitive pulmonary embolism is seen.   Electronically Signed   By: Alcide Clever M.D.   On: 05/29/2014 12:03    Scheduled Meds: . acetylcysteine  2 mL Nebulization Q4H  . aspirin EC  81 mg  Oral Daily  . brinzolamide  1 drop Right Eye 2 times per day  . budesonide-formoterol  2 puff Inhalation BID  . diltiazem  240 mg Oral Daily  . enoxaparin (LOVENOX) injection  30 mg Subcutaneous Q24H  . guaiFENesin  1,200 mg Oral BID  . ipratropium  0.5 mg Nebulization Q4H  . latanoprost  1 drop Right Eye QHS  . levalbuterol  0.63 mg Nebulization Q4H  . levofloxacin (LEVAQUIN) IV  750 mg Intravenous Q48H  . levothyroxine  75 mcg Oral QAC breakfast  . loratadine  10 mg Oral Daily  . LORazepam  0.5 mg Oral QHS  . methylPREDNISolone (SOLU-MEDROL) injection  125 mg Intravenous Q6H  . pantoprazole  40 mg Oral Daily  . sertraline  25 mg Oral Daily   Continuous Infusions: . sodium chloride 75 mL/hr at 05/31/14 1610    Principal Problem:   Acute respiratory failure Active Problems:   COPD exacerbation   CAP (community acquired pneumonia)   Acute bronchitis   New onset a-fib   Hypertension   Hyperlipemia   Hypothyroid   CKD (chronic kidney disease)   Asthma, chronic   Hyponatremia   Hypoxia   Positive D dimer   Cardiomyopathy   Acute exacerbation of CHF (congestive heart failure); Probable    Time spent: 40 mins    [facility] MD [facility] Pager [phone]. If 7PM-7AM, please contact night-coverage at [email or web address], password [facility] 05/31/2014,  8:22 AM  LOS: 4 days

## 2014-05-31 NOTE — Progress Notes (Signed)
Panic sodium of 115 called from lab.  Hospitalist paged results.

## 2014-06-01 ENCOUNTER — Encounter (HOSPITAL_COMMUNITY): Payer: Medicare Other

## 2014-06-01 ENCOUNTER — Inpatient Hospital Stay (HOSPITAL_COMMUNITY): Payer: Medicare Other

## 2014-06-01 LAB — BASIC METABOLIC PANEL
ANION GAP: 9 (ref 5–15)
Anion gap: 11 (ref 5–15)
Anion gap: 9 (ref 5–15)
Anion gap: 12 (ref 5–15)
BUN: 39 mg/dL — AB (ref 6–23)
BUN: 39 mg/dL — AB (ref 6–23)
BUN: 39 mg/dL — ABNORMAL HIGH (ref 6–23)
BUN: 37 mg/dL — ABNORMAL HIGH (ref 6–23)
CALCIUM: 7.5 mg/dL — AB (ref 8.4–10.5)
CALCIUM: 7.9 mg/dL — AB (ref 8.4–10.5)
CALCIUM: 7.7 mg/dL — AB (ref 8.4–10.5)
CHLORIDE: 91 mmol/L — AB (ref 96–112)
CHLORIDE: 92 mmol/L — AB (ref 96–112)
CHLORIDE: 90 mmol/L — AB (ref 96–112)
CHLORIDE: 90 mmol/L — AB (ref 96–112)
CO2: 19 mmol/L (ref 19–32)
CO2: 21 mmol/L (ref 19–32)
CO2: 25 mmol/L (ref 19–32)
CO2: 26 mmol/L (ref 19–32)
Calcium: 7.9 mg/dL — ABNORMAL LOW (ref 8.4–10.5)
Creatinine, Ser: 1.47 mg/dL — ABNORMAL HIGH (ref 0.50–1.10)
Creatinine, Ser: 1.44 mg/dL — ABNORMAL HIGH (ref 0.50–1.10)
Creatinine, Ser: 1.4 mg/dL — ABNORMAL HIGH (ref 0.50–1.10)
Creatinine, Ser: 1.42 mg/dL — ABNORMAL HIGH (ref 0.50–1.10)
GFR calc Af Amer: 34 mL/min — ABNORMAL LOW (ref >90)
GFR calc Af Amer: 35 mL/min — ABNORMAL LOW (ref >90)
GFR calc Af Amer: 36 mL/min — ABNORMAL LOW (ref >90)
GFR calc Af Amer: 36 mL/min — ABNORMAL LOW (ref >90)
GFR calc non Af Amer: 30 mL/min — ABNORMAL LOW (ref >90)
GFR calc non Af Amer: 30 mL/min — ABNORMAL LOW (ref >90)
GFR calc non Af Amer: 31 mL/min — ABNORMAL LOW (ref >90)
GFR calc non Af Amer: 31 mL/min — ABNORMAL LOW (ref >90)
GLUCOSE: 108 mg/dL — AB (ref 70–99)
GLUCOSE: 198 mg/dL — AB (ref 70–99)
Glucose, Bld: 128 mg/dL — ABNORMAL HIGH (ref 70–99)
Glucose, Bld: 117 mg/dL — ABNORMAL HIGH (ref 70–99)
Potassium: 4.1 mmol/L (ref 3.5–5.1)
Potassium: 4.6 mmol/L (ref 3.5–5.1)
Potassium: 3.5 mmol/L (ref 3.5–5.1)
Potassium: 3.2 mmol/L — ABNORMAL LOW (ref 3.5–5.1)
Sodium: 119 mmol/L — CL (ref 135–145)
Sodium: 124 mmol/L — ABNORMAL LOW (ref 135–145)
Sodium: 124 mmol/L — ABNORMAL LOW (ref 135–145)
Sodium: 128 mmol/L — ABNORMAL LOW (ref 135–145)

## 2014-06-01 LAB — CBC WITH DIFFERENTIAL/PLATELET
BASOS ABS: 0 10*3/uL (ref 0.0–0.1)
Basophils Relative: 0 % (ref 0–1)
Eosinophils Absolute: 0 10*3/uL (ref 0.0–0.7)
Eosinophils Relative: 0 % (ref 0–5)
HEMATOCRIT: 35.7 % — AB (ref 36.0–46.0)
Hemoglobin: 12.3 g/dL (ref 12.0–15.0)
LYMPHS PCT: 5 % — AB (ref 12–46)
Lymphs Abs: 0.4 10*3/uL — ABNORMAL LOW (ref 0.7–4.0)
MCH: 31.5 pg (ref 26.0–34.0)
MCHC: 34.5 g/dL (ref 30.0–36.0)
MCV: 91.3 fL (ref 78.0–100.0)
Monocytes Absolute: 0.6 10*3/uL (ref 0.1–1.0)
Monocytes Relative: 8 % (ref 3–12)
Neutro Abs: 6.1 10*3/uL (ref 1.7–7.7)
Neutrophils Relative %: 87 % — ABNORMAL HIGH (ref 43–77)
PLATELETS: 203 10*3/uL (ref 150–400)
RBC: 3.91 MIL/uL (ref 3.87–5.11)
RDW: 14.4 % (ref 11.5–15.5)
WBC: 7 10*3/uL (ref 4.0–10.5)

## 2014-06-01 LAB — BRAIN NATRIURETIC PEPTIDE: B Natriuretic Peptide: 1401 pg/mL — ABNORMAL HIGH (ref 0.0–100.0)

## 2014-06-01 LAB — MAGNESIUM
MAGNESIUM: 1.8 mg/dL (ref 1.5–2.5)
Magnesium: 1.8 mg/dL (ref 1.5–2.5)

## 2014-06-01 MED ORDER — SODIUM CHLORIDE 0.9 % IJ SOLN
10.0000 mL | INTRAMUSCULAR | Status: DC | PRN
  Administered 2014-06-01: 40 mL
  Administered 2014-06-07: 10 mL
  Filled 2014-06-01 (×2): qty 40

## 2014-06-01 MED ORDER — SODIUM CHLORIDE 0.9 % IJ SOLN
10.0000 mL | Freq: Two times a day (BID) | INTRAMUSCULAR | Status: DC
  Administered 2014-06-01 – 2014-06-08 (×11): 10 mL

## 2014-06-01 NOTE — Progress Notes (Signed)
WE ARE HAVING MUCH DIFFICULTY MAINTAINING 2 IV SITE AND DRAWING LABS. DR Janee MornHOMPSON CONTACTED AND REQUEST MADE FOR PICC LINE. HE WAS CONCERNED ABOUT PT'S RENAL STATUS AND REQUESTED WE CON[facility]CT [facility]  [facility]OV[facility]. [facility]  AGREED FOR PT TO HAVE PICC LINE

## 2014-06-01 NOTE — Progress Notes (Signed)
Peripherally Inserted Central Catheter/Midline Placement  The IV Nurse has discussed with the patient and/or persons authorized to consent for the patient (daughter, Ms. Rosana BergerBelton), the purpose of this procedure and the potential benefits and risks involved with this procedure.  The benefits include less needle sticks, lab draws from the catheter and patient may be discharged home with the catheter.  Risks include, but not limited to, infection, bleeding, blood clot (thrombus formation), and puncture of an artery; nerve damage and irregular heat beat.  Alternatives to this procedure were also discussed.  PICC/Midline Placement Documentation  PICC Triple Lumen 06/01/14 PICC Right Basilic 38 cm 0 cm (Active)  Indication for Insertion or Continuance of Line Vasoactive infusions;Prolonged intravenous therapies;Poor Vasculature-patient has had multiple peripheral attempts or PIVs lasting less than 24 hours;Limited venous access - need for IV therapy >5 days (PICC only);Chronic illness with exacerbations (CF, Sickle Cell, etc.) 06/01/2014  1:28 PM  Exposed Catheter (cm) 0 cm 06/01/2014  1:28 PM  Site Assessment Clean;Dry;Intact 06/01/2014  1:28 PM  Lumen #1 Status Flushed;Capped (Central line);Blood return noted 06/01/2014  1:28 PM  Lumen #2 Status Flushed;Capped (Central line);Blood return noted 06/01/2014  1:28 PM  Lumen #3 Status Flushed;Capped (Central line);Blood return noted 06/01/2014  1:28 PM  Dressing Type Transparent;Restrictive 06/01/2014  1:28 PM  Dressing Status Clean;Dry;Antimicrobial disc in place;Intact 06/01/2014  1:28 PM  Line Care Connections checked and tightened 06/01/2014  1:28 PM  Line Adjustment (NICU/IV Team Only) No 06/01/2014  1:28 PM  Dressing Intervention New dressing 06/01/2014  1:28 PM  Dressing Change Due 2014-04-25 06/01/2014  1:28 PM       Nargis Abrams, Meredith ModyGina S 06/01/2014, 1:31 PM

## 2014-06-01 NOTE — Progress Notes (Signed)
[facility] PROGRESS NOTE  Daisy Scott ZOX:[id number] DOB: October 01, 1918 DOA: 05/27/2014 PCP: Rudi Heap, MD  Assessment/Plan: #1 acute respiratory failure Likely multifactorial secondary to worsening acute COPD exacerbation and community-acquired pneumonia versus bronchitis in the setting of A. fib with RVR and probable acute systolic CHF exacerbation. Patient did have an elevated d-dimer and concern for pulmonary emboli. Per daughter patient with prior history of pulmonary emboli and DVT several years ago. VQ scan negative for PE. Patient was placed on low-dose diuretics per cardiology for possible cardiac asthma. Lopressor has been changed to Cardizem. Rate control. Patient currently on aspirin. Patient noted to have a weight of 83.1 kg today from 77.2 kg on admission. IV Lasix have been started by nephrology currently on 60 IV every 12 secondary to hyponatremia. Patient with good urine output.  Repeat chest x-ray. ABG with a pH of 7.32 PCO2 of 43 PO2 of 71 bicarbonate of 21. Patient with increased work of breathing yesterday.. Likely worsening COPD exacerbation. Continue IV Levaquin, Mucomyst. Change IV Solu-Medrol to 125 mg IV every 8 hours. Chest PT. Continue BiPAP.  Monitor closely.   #2 A. fib with RVR Patient with left bundle branch block by EKG with prior evidence of IVCD. A. fib with RVR could be secondary to acute respiratory tract infection. Patient's chads score is 5. Patient's daughter declines pursuing anticoagulation at this time. TSH is normal. 2-D echo with EF of 25-30%, with diffuse hypokinesis and septal dyssynergy. Lopressor has been changed to Cardizem per cardiology secondary to wheezing. Continue aspirin. Keep magnesium greater than 2. Cardiology following and appreciate input and recommendations.  #3 probable early community-acquired pneumonia versus bronchitis Patient with diffuse wheezing, rhonchorous breath sounds, increased work of breathing yesterday. Patient  with some improvement on BiPAP. Chest x-ray yesterday with questionable densities in the right hand, right lower lung region. Foci of airspace disease or infection cannot be excluded. Repeat chest x-ray this morning. Continue IV antibiotics. Continue nebulizer treatments. Continue Mucinex. Continue oxygen and Claritin.   #4 acute COPD exacerbation Patient with worsening shortness of breath yesterday with use of accessory muscles of respiration. Patient wheezing diffusely. Slight improvement. Patient with rhonchorous breath sounds. ABG from yesterday with a pH of 7.3 PCO2 of 43 PO2 of 71 bicarbonate of 21. Change i IV steroids to Solu-Medrol 125 IV every 8 hours. Continue IV Levaquin. Continue Mucomyst. Continue Mucinex. Oxygen. Continue Xopenex and Atrovent nebulizers. Continue BiPAP. Repeat chest x-ray in the morning.   #5 probable acute systolic CHF exacerbation/probable ischemic cardiomyopathy Patient was placed on low-dose IV Lasix per cardiology. Patient with mildly elevated troponins felt to be secondary to A. fib with RVR. 2-D echo with EF of 25-30% with diffuse hypokinesis and septal dyssynergy. Patient noted to have worsening hyponatremia and as such discontinued IV Lasix. Patient with increased weight since admission. Patient also noted to be hyponatremic which is felt to be secondary to hypervolemic hyponatremia. Patient has been resumed back on Lasix 60 mg IV every 12 hours. Urine output yesterday was 2.8 L. Patient is currently +1.87 L from this hospitalization. Patient also started on nitroglycerin infusion in case there is an ischemic component. Cardiology following and appreciate input and recommendations.  #6 chronic kidney disease stage II Stable. Follow.  #7 hypothyroidism TSH within normal limits. Continue home dose Synthroid.  #8 hyponatremia Hyponatremia questionable etiology. May be secondary to hypervolemic hyponatremia as patient's weight currently at 83.7 kg from 77.5 kg on  admission. Urine sodium was less than 10. Urine osmolality is  low. Repeat chest x-ray with worsening infiltrates. Patient's sodium dropped as low as 115 currently at 119. Patient has been seen by nephrology patient was started on Lasix 60 mg IV every 12 hours, normal saline. Fluid restriction. Continue serial BMETs. Nephrology following and appreciate input and recommendations.   #9 hypertension Stable. On Cardizem.  #10 positive d-dimer Patient with hypoxia and shortness of breath. Patient with prior history of PE and DVT per daughter. VQ scan negative for PE.  #11 prophylaxis Lovenox for DVT prophylaxis.    Code Status: Full Family Communication: Updated patient, no family at bedside. Disposition Plan: Remain in stepdown unit.   Consultants:  Cardiology: Dr Diona BrownerMcDowell 05/28/14  Nephrology: Dr. Fausto SkillernBefakadu 2/12/ 2016  Procedures:  2 d echo 05/28/14  Chest x-ray 05/27/2014    lower extremity Dopplers 05/28/2014 negative  VQ scan 05/29/2014  Antibiotics:  IV ROCEPHIN 05/27/2014>>>> 05/29/2014  IV AZITHROMYCIN 05/27/2014 >>>> 05/29/2014  Oral Augmentin 05/30/2014>>>> 05/31/2014  IV Levaquin 05/31/2014  HPI/Subjective: Patient on BiPAP. Slight improvement with respiratory status.  Objective: Filed Vitals:   06/01/14 0808  BP: 123/57  Pulse: 83  Temp:   Resp: 15    Intake/Output Summary (Last 24 hours) at 06/01/14 0840 Last data filed at 06/01/14 0617  Gross per 24 hour  Intake 3875.5 ml  Output   2300 ml  Net 1575.5 ml   Filed Weights   05/31/14 0808 05/31/14 0930 06/01/14 0500  Weight: 83.734 kg (184 lb 9.6 oz) 85 kg (187 lb 6.3 oz) 83.7 kg (184 lb 8.4 oz)    Exam:   General:  Use of accessory muscles of respiration. On BiPAP.  Cardiovascular: Irregularly irregular. Trace bilateral lower extremity edema.   Respiratory: Diffuse coarse breath sounds. Expiratory wheezes.Use of accessory muscles of respiration. On BiPAP.  Abdomen: Soft, nontender,  nondistended, positive bowel sounds.  Musculoskeletal: No clubbing cyanosis or edema. Trace bilateral lower extremity edema.  Data Reviewed: Basic Metabolic Panel:  Recent Labs Lab 05/29/14 0802  05/30/14 0522 05/30/14 2012 05/31/14 0518 05/31/14 1515 05/31/14 2111 06/01/14 0326  NA 122*  < > 120* 115* 118* 115* 116* 119*  K 3.9  --  4.0 4.6 4.3 4.3 4.2 4.1  CL 89*  --  85* 84* 81* 85* 86* 91*  CO2 24  --  24 22 23 22 21 19   GLUCOSE 132*  --  121* 142* 132* 139* 124* 108*  BUN 32*  --  39* 42* 42* 42* 42* 39*  CREATININE 1.48*  --  1.49* 1.66* 1.55* 1.53* 1.48* 1.47*  CALCIUM 8.2*  --  8.3* 8.0* 8.4 7.8* 7.7* 7.5*  MG 1.5  --  2.0  --  1.9  --   --  1.8  < > = values in this interval not displayed. Liver Function Tests: No results for input(s): AST, ALT, ALKPHOS, BILITOT, PROT, ALBUMIN in the last 168 hours. No results for input(s): LIPASE, AMYLASE in the last 168 hours. No results for input(s): AMMONIA in the last 168 hours. CBC:  Recent Labs Lab 05/27/14 1023 05/29/14 0802 05/30/14 0522 05/31/14 0518 06/01/14 0326  WBC 7.0 12.6* 9.4 9.2 7.0  NEUTROABS  --  11.2* 8.5*  --  6.1  HGB 12.2 12.6 12.1 12.3 12.3  HCT 43.9 38.2 36.1 36.4 35.7*  MCV 95.2 93.9 91.2 89.9 91.3  PLT  --  232 222 290 203   Cardiac Enzymes:  Recent Labs Lab 05/28/14 0635 05/28/14 1924 05/29/14 0100  TROPONINI 0.04* 0.05* 0.07*   BNP (  last 3 results)  Recent Labs  05/30/14 0522 05/31/14 0518  BNP 1025.0* 939.0*    ProBNP (last 3 results) No results for input(s): PROBNP in the last 8760 hours.  CBG: No results for input(s): GLUCAP in the last 168 hours.  Recent Results (from the past 240 hour(s))  Culture, expectorated sputum-assessment     Status: None   Collection Time: 05/27/14  8:49 PM  Result Value Ref Range Status   Specimen Description SPUTUM  Final   Special Requests NONE  Final   Sputum evaluation   Final    THIS SPECIMEN IS ACCEPTABLE. RESPIRATORY CULTURE REPORT  TO FOLLOW. PERFORMED AT [facility]    Report Status 05/27/2014 FINAL  Final  Culture, respiratory (NON-Expectorated)     Status: None   Collection Time: 05/27/14  8:49 PM  Result Value Ref Range Status   Specimen Description SPUTUM EXPECTORATED  Final   Special Requests NONE  Final   Gram Stain   Final    FEW WBC PRESENT,BOTH PMN AND MONONUCLEAR FEW SQUAMOUS EPITHELIAL CELLS PRESENT FEW GRAM POSITIVE COCCI IN PAIRS RARE GRAM NEGATIVE RODS Performed at [facility]    Culture   Final    NORMAL OROPHARYNGEAL FLORA Performed at [facility]    Report Status 05/30/2014 FINAL  Final  Culture, Urine     Status: None   Collection Time: 05/27/14  8:50 PM  Result Value Ref Range Status   Specimen Description URINE, CLEAN CATCH  Final   Special Requests NONE  Final   Colony Count NO GROWTH Performed at [facility]   Final   Culture NO GROWTH Performed at [facility]   Final   Report Status 05/29/2014 FINAL  Final  Rapid strep screen     Status: None   Collection Time: 05/28/14 12:25 PM  Result Value Ref Range Status   Streptococcus, Group A Screen (Direct) NEGATIVE NEGATIVE Final    Comment: (NOTE) A Rapid Antigen test may result negative if the antigen level in the sample is below the detection level of this test. The FDA has not cleared this test as a stand-alone test therefore the rapid antigen negative result has reflexed to a Group A Strep culture.   Culture, Group A Strep     Status: None   Collection Time: 05/28/14 12:25 PM  Result Value Ref Range Status   Specimen Description THROAT  Final   Special Requests NONE  Final   Culture   Final    No Beta Hemolytic Streptococci Isolated Performed at [facility]    Report Status 05/30/2014 FINAL  Final  Culture, Urine     Status: None   Collection Time: 05/29/14  5:46 PM  Result Value Ref Range Status   Specimen Description URINE, CLEAN CATCH  Final   Special Requests NONE  Final    Colony Count NO GROWTH Performed at [facility]   Final   Culture NO GROWTH Performed at [facility]   Final   Report Status 05/31/2014 FINAL  Final  MRSA PCR Screening     Status: None   Collection Time: 05/31/14 10:04 AM  Result Value Ref Range Status   MRSA by PCR NEGATIVE NEGATIVE Final    Comment:        The GeneXpert MRSA Assay (FDA approved for NASAL specimens only), is one component of a comprehensive MRSA colonization surveillance program. It is not intended to diagnose MRSA infection nor to guide or monitor  treatment for MRSA infections.      Studies: Dg Chest 2 View  05/30/2014   CLINICAL DATA:  Cough and shortness of Breath progressing over the past 4 days.  EXAM: CHEST  2 VIEW  COMPARISON:  05/27/2014  FINDINGS: The heart is enlarged. There is tortuosity, ectasia and calcification of the thoracic aorta. New bibasilar densities suspicious for pneumonia. No pulmonary edema or pleural effusion.  IMPRESSION: Suspect new bibasilar infiltrates.   Electronically Signed   By: Rudie Meyer M.D.   On: 05/30/2014 15:52   Dg Chest Port 1 View  05/31/2014   CLINICAL DATA:  Shortness of breath.  EXAM: PORTABLE CHEST - 1 VIEW  COMPARISON:  05/30/2014 and 05/27/2014.  FINDINGS: There appears to be increased densities in the right hilum and medial right lung base. Heart size is upper limits of normal but minimally changed. Upper lungs appear to be clear.  IMPRESSION: Questionable densities in the right hilum and right lower lung region. Foci of airspace disease or infection cannot be excluded. Recommend continued follow-up.   Electronically Signed   By: Richarda Overlie M.D.   On: 05/31/2014 08:20    Scheduled Meds: . acetylcysteine  2 mL Nebulization Q4H  . antiseptic oral rinse  7 mL Mouth Rinse q12n4p  . aspirin EC  81 mg Oral Daily  . brinzolamide  1 drop Right Eye 2 times per day  . budesonide-formoterol  2 puff Inhalation BID  . chlorhexidine  15 mL Mouth Rinse  BID  . diltiazem  240 mg Oral Daily  . enoxaparin (LOVENOX) injection  30 mg Subcutaneous Q24H  . furosemide  60 mg Intravenous BID  . guaiFENesin  1,200 mg Oral BID  . ipratropium  0.5 mg Nebulization Q4H  . latanoprost  1 drop Right Eye QHS  . levalbuterol  0.63 mg Nebulization Q4H  . levofloxacin (LEVAQUIN) IV  750 mg Intravenous Q48H  . levothyroxine  75 mcg Oral QAC breakfast  . loratadine  10 mg Oral Daily  . LORazepam  0.5 mg Oral QHS  . methylPREDNISolone (SOLU-MEDROL) injection  125 mg Intravenous Q6H  . pantoprazole  40 mg Oral Daily  . sodium chloride  500 mL Intravenous Once   Continuous Infusions: . sodium chloride 75 mL/hr at 06/01/14 0700  . nitroGLYCERIN 5 mcg/min (06/01/14 0700)    Principal Problem:   Acute respiratory failure Active Problems:   COPD exacerbation   CAP (community acquired pneumonia)   Acute bronchitis   New onset a-fib   Hypertension   Hyperlipemia   Hypothyroid   CKD (chronic kidney disease)   Asthma, chronic   Hyponatremia   Hypoxia   Positive D dimer   Cardiomyopathy   Acute exacerbation of CHF (congestive heart failure); Probable    Time spent: 40 mins    [facility] MD [facility] Pager [phone]. If 7PM-7AM, please contact night-coverage at [email or web address], password [facility] 06/01/2014, 8:39 AM  LOS: 5 days

## 2014-06-01 NOTE — Progress Notes (Signed)
Subjective: Interval History: none.  Objective: Vital signs in last 24 hours: Temp:  [97.4 F (36.3 C)-97.7 F (36.5 C)] 97.4 F (36.3 C) (02/13 0500) Pulse Rate:  [33-163] 72 (02/13 0700) Resp:  [15-33] 15 (02/13 0700) BP: (69-151)/(36-128) 118/54 mmHg (02/13 0700) SpO2:  [91 %-100 %] 97 % (02/13 0700) FiO2 (%):  [30 %] 30 % (02/12 2309) Weight:  [83.7 kg (184 lb 8.4 oz)-85 kg (187 lb 6.3 oz)] 83.7 kg (184 lb 8.4 oz) (02/13 0500) Weight change:   Intake/Output from previous day: 02/12 0701 - 02/13 0700 In: 4025.5 [P.O.:600; I.V.:1775.5; IV Piggyback:650] Out: 2300 [Urine:2300] Intake/Output this shift:   generally patient is sleepy but arousable. Patient still with elevated JVD Chest: Expiratory wheezing Extremities: Trace edema    Lab Results:  Recent Labs  05/31/14 0518 06/01/14 0326  WBC 9.2 7.0  HGB 12.3 12.3  HCT 36.4 35.7*  PLT 290 203   BMET:  Recent Labs  05/31/14 2111 06/01/14 0326  NA 116* 119*  K 4.2 4.1  CL 86* 91*  CO2 21 19  GLUCOSE 124* 108*  BUN 42* 39*  CREATININE 1.48* 1.47*  CALCIUM 7.7* 7.5*   No results for input(s): PTH in the last 72 hours. Iron Studies: No results for input(s): IRON, TIBC, TRANSFERRIN, FERRITIN in the last 72 hours.  Studies/Results: Dg Chest 2 View  05/30/2014   CLINICAL DATA:  Cough and shortness of Breath progressing over the past 4 days.  EXAM: CHEST  2 VIEW  COMPARISON:  05/27/2014  FINDINGS: The heart is enlarged. There is tortuosity, ectasia and calcification of the thoracic aorta. New bibasilar densities suspicious for pneumonia. No pulmonary edema or pleural effusion.  IMPRESSION: Suspect new bibasilar infiltrates.   Electronically Signed   By: Rudie MeyerP.  Gallerani M.D.   On: 05/30/2014 15:52   Dg Chest Port 1 View  05/31/2014   CLINICAL DATA:  Shortness of breath.  EXAM: PORTABLE CHEST - 1 VIEW  COMPARISON:  05/30/2014 and 05/27/2014.  FINDINGS: There appears to be increased densities in the right hilum and  medial right lung base. Heart size is upper limits of normal but minimally changed. Upper lungs appear to be clear.  IMPRESSION: Questionable densities in the right hilum and right lower lung region. Foci of airspace disease or infection cannot be excluded. Recommend continued follow-up.   Electronically Signed   By: Richarda OverlieAdam  Henn M.D.   On: 05/31/2014 08:20    I have reviewed the patient's current medications.  Assessment/Plan: Problem #1 hyponatremia: Possibly hypervolemic hyponatremia. Her urine sodium is less than 10. Presently patient on normal saline and Lasix with free water restriction. Her sodium improved from 115 to 119 the last 12 hours which is reasonable. Problem #2 renal failure: Possibly chronic her BUN and creatinine stable with normal potassium. Problem #3 difficulty in breathing: Possibly a combination of exacerbation of COPD/right hilum and right lower lung pneumonia/CHF. Presently she is on BiPAP and had 2300 mL of urine and she seems to be improving. Problem #4 hypertension Problem #5 history of COPD Problem #6 hypothyroidism Plan: We'll continue his present management We'll check her basic metabolic panel today and in the morning.   LOS: 5 days   Brenda Cowher S 06/01/2014,7:52 AM

## 2014-06-01 NOTE — Progress Notes (Signed)
Patient was assessed on bipap in AM and found breathing easier; bbsounds were better; neb treatment given on bipap. Patient was removed from bipap approx 11:40 to attempt eating; successful at eating some food.  Patient remained off bipap and did eat some ice cream later in pm.  Patient received all neb treatments and remained off bipap with good sats and vital signs; bbsounds better with some wheezing and rhonchi. Gave report to next therapist.  Charlott HollerMike Loranda Mastel RRT

## 2014-06-01 NOTE — Progress Notes (Signed)
PT HAS HAD MULTIPLE FAMILY MEMBER IN THROUGH OUT TODAY AND YESTERDAY. SHE HAS PRIVATE SITTER AT NIGHTS. SHE HAS A GOOD SUPPORT SYSTEM.  SHE HAS SLEPT MOST OF THE DAY. SHE DID NOT EAT AT ALL YESTERDAY,BUT ATE A SMALL AMT TODAY. HAS DIFFICULTY DRINKING FROM [employer]. FIRST SHE BLOWS INTO IT AND THEN WILL  TRY TO SUCK THROUGH IT. WHEN AWAKE SHE IS CONSTANTLY CHEWING HER TONGUE.

## 2014-06-01 NOTE — Progress Notes (Signed)
  Pharmacist Heart Failure Core Measure Documentation  Assessment: Daisy Scott has an EF documented as 25-30% on 05/31/2014 by Transthoracic Echocardiography.  Rationale: Heart failure patients with left ventricular systolic dysfunction (LVSD) and an EF < 40% should be prescribed an angiotensin converting enzyme inhibitor (ACEI) or angiotensin receptor blocker (ARB) at discharge unless a contraindication is documented in the medical record.  This patient is not currently on an ACEI or ARB for HF.  This note is being placed in the record in order to provide documentation that a contraindication to the use of these agents is present for this encounter.  ACE Inhibitor or Angiotensin Receptor Blocker is contraindicated (specify all that apply)  [x]   ACEI allergy  []   Angioedema []   Moderate or severe aortic stenosis []   Hyperkalemia []   Hypotension []   Renal artery stenosis [x]   Worsening renal function, preexisting renal disease or dysfunction   Mady GemmaHayes, Shawne Eskelson R 06/01/2014 1:10 PM

## 2014-06-02 LAB — BASIC METABOLIC PANEL
ANION GAP: 7 (ref 5–15)
Anion gap: 6 (ref 5–15)
BUN: 36 mg/dL — ABNORMAL HIGH (ref 6–23)
BUN: 38 mg/dL — AB (ref 6–23)
CALCIUM: 6.9 mg/dL — AB (ref 8.4–10.5)
CHLORIDE: 95 mmol/L — AB (ref 96–112)
CHLORIDE: 92 mmol/L — AB (ref 96–112)
CO2: 26 mmol/L (ref 19–32)
CO2: 29 mmol/L (ref 19–32)
Calcium: 7.7 mg/dL — ABNORMAL LOW (ref 8.4–10.5)
Creatinine, Ser: 1.33 mg/dL — ABNORMAL HIGH (ref 0.50–1.10)
Creatinine, Ser: 1.29 mg/dL — ABNORMAL HIGH (ref 0.50–1.10)
GFR calc Af Amer: 39 mL/min — ABNORMAL LOW (ref >90)
GFR calc non Af Amer: 33 mL/min — ABNORMAL LOW (ref >90)
GFR calc non Af Amer: 35 mL/min — ABNORMAL LOW (ref >90)
GFR, EST AFRICAN AMERICAN: 40 mL/min — AB (ref >90)
GLUCOSE: 153 mg/dL — AB (ref 70–99)
Glucose, Bld: 106 mg/dL — ABNORMAL HIGH (ref 70–99)
Potassium: 3 mmol/L — ABNORMAL LOW (ref 3.5–5.1)
Potassium: 4 mmol/L (ref 3.5–5.1)
SODIUM: 127 mmol/L — AB (ref 135–145)
Sodium: 128 mmol/L — ABNORMAL LOW (ref 135–145)

## 2014-06-02 LAB — CBC
HCT: 25.5 % — ABNORMAL LOW (ref 36.0–46.0)
Hemoglobin: 8.9 g/dL — ABNORMAL LOW (ref 12.0–15.0)
MCH: 31.2 pg (ref 26.0–34.0)
MCHC: 34.9 g/dL (ref 30.0–36.0)
MCV: 89.5 fL (ref 78.0–100.0)
Platelets: 189 10*3/uL (ref 150–400)
RBC: 2.85 MIL/uL — AB (ref 3.87–5.11)
RDW: 14.4 % (ref 11.5–15.5)
WBC: 5.3 10*3/uL (ref 4.0–10.5)

## 2014-06-02 LAB — BRAIN NATRIURETIC PEPTIDE: B NATRIURETIC PEPTIDE 5: 816 pg/mL — AB (ref 0.0–100.0)

## 2014-06-02 LAB — OSMOLALITY, URINE: Osmolality, Ur: 443 mOsm/kg (ref 390–1090)

## 2014-06-02 MED ORDER — POTASSIUM CHLORIDE CRYS ER 20 MEQ PO TBCR
40.0000 meq | EXTENDED_RELEASE_TABLET | Freq: Once | ORAL | Status: AC
  Administered 2014-06-02: 40 meq via ORAL
  Filled 2014-06-02: qty 2

## 2014-06-02 MED ORDER — POTASSIUM CHLORIDE 10 MEQ/50ML IV SOLN
10.0000 meq | INTRAVENOUS | Status: DC
  Filled 2014-06-02 (×4): qty 50

## 2014-06-02 MED ORDER — FUROSEMIDE 10 MG/ML IJ SOLN
40.0000 mg | Freq: Every day | INTRAMUSCULAR | Status: DC
  Administered 2014-06-02: 40 mg via INTRAVENOUS
  Filled 2014-06-02: qty 4

## 2014-06-02 MED ORDER — POTASSIUM CHLORIDE IN NACL 20-0.9 MEQ/L-% IV SOLN
INTRAVENOUS | Status: DC
  Administered 2014-06-02 – 2014-06-03 (×2): via INTRAVENOUS

## 2014-06-02 MED ORDER — METHYLPREDNISOLONE SODIUM SUCC 125 MG IJ SOLR
125.0000 mg | Freq: Three times a day (TID) | INTRAMUSCULAR | Status: DC
  Administered 2014-06-02 – 2014-06-05 (×9): 125 mg via INTRAVENOUS
  Filled 2014-06-02 (×9): qty 2

## 2014-06-02 MED ORDER — POTASSIUM CHLORIDE CRYS ER 20 MEQ PO TBCR
40.0000 meq | EXTENDED_RELEASE_TABLET | ORAL | Status: DC
  Administered 2014-06-02: 40 meq via ORAL
  Filled 2014-06-02: qty 2

## 2014-06-02 MED ORDER — POTASSIUM CHLORIDE 10 MEQ/100ML IV SOLN
10.0000 meq | INTRAVENOUS | Status: AC
  Administered 2014-06-02 (×3): 10 meq via INTRAVENOUS
  Filled 2014-06-02: qty 100

## 2014-06-02 MED ORDER — POTASSIUM CHLORIDE 10 MEQ/100ML IV SOLN
INTRAVENOUS | Status: AC
  Administered 2014-06-02: 10 meq
  Filled 2014-06-02: qty 100

## 2014-06-02 NOTE — Progress Notes (Signed)
Subjective: Interval History: none.  Objective: Vital signs in last 24 hours: Temp:  [97.3 F (36.3 C)-98.8 F (37.1 C)] 97.3 F (36.3 C) (02/14 0400) Pulse Rate:  [33-139] 59 (02/14 0600) Resp:  [15-28] 16 (02/14 0600) BP: (73-136)/(35-107) 118/60 mmHg (02/14 0600) SpO2:  [78 %-100 %] 96 % (02/14 0600) FiO2 (%):  [30 %] 30 % (02/14 0307) Weight:  [83.1 kg (183 lb 3.2 oz)-85.3 kg (188 lb 0.8 oz)] 85.3 kg (188 lb 0.8 oz) (02/14 0500) Weight change: -0.634 kg (-1 lb 6.4 oz)  Intake/Output from previous day: 02/13 0701 - 02/14 0700 In: 2626.2 [P.O.:360; I.V.:1866.2; IV Piggyback:400] Out: 5300 [Urine:5300] Intake/Output this shift:   generally patient is drowsy and sleepy but arousable Patient still with elevated JVD Chest: Expiratory wheezing Extremities: Trace edema    Lab Results:  Recent Labs  06/01/14 0326 06/02/14 0445  WBC 7.0 5.3  HGB 12.3 8.9*  HCT 35.7* 25.5*  PLT 203 189   BMET:   Recent Labs  06/01/14 2146 06/02/14 0304  NA 128* 127*  K 3.2* 3.0*  CL 90* 95*  CO2 26 26  GLUCOSE 198* 153*  BUN 37* 36*  CREATININE 1.42* 1.33*  CALCIUM 7.9* 6.9*   No results for input(s): PTH in the last 72 hours. Iron Studies: No results for input(s): IRON, TIBC, TRANSFERRIN, FERRITIN in the last 72 hours.  Studies/Results: Dg Chest Port 1 View  06/01/2014   CLINICAL DATA:  PICC line placement. Acute respiratory failure, pneumonia, cough, CHF, COPD.  EXAM: PORTABLE CHEST - 1 VIEW  COMPARISON:  05/31/2014 and prior radiographs  FINDINGS: A right PICC line is now noted with tip overlying the mid SVC.  Cardiomegaly and pulmonary vascular congestion again noted.  Stable opacities overlying the mid and lower right lung again noted.  There is no evidence of pneumothorax.  Mild bibasilar atelectasis is present.  IMPRESSION: Right PICC line with tip overlying the mid SVC.  Otherwise unchanged chest radiograph.   Electronically Signed   By: Harmon PierJeffrey  Hu M.D.   On: 06/01/2014  13:53   Dg Chest Port 1 View  05/31/2014   CLINICAL DATA:  Shortness of breath.  EXAM: PORTABLE CHEST - 1 VIEW  COMPARISON:  05/30/2014 and 05/27/2014.  FINDINGS: There appears to be increased densities in the right hilum and medial right lung base. Heart size is upper limits of normal but minimally changed. Upper lungs appear to be clear.  IMPRESSION: Questionable densities in the right hilum and right lower lung region. Foci of airspace disease or infection cannot be excluded. Recommend continued follow-up.   Electronically Signed   By: Richarda OverlieAdam  Henn M.D.   On: 05/31/2014 08:20    I have reviewed the patient's current medications.  Assessment/Plan: Problem #1 hyponatremia: Possibly hypervolemic hyponatremia. Presently she is on freewater restriction, normal saline and Lasix. Her sodium 127 has improved. Problem #2 renal failure: Possibly chronic her BUN and creatinine stable with normal potassium. Problem #3 difficulty in breathing: Possibly a combination of exacerbation of COPD/right hilum and right lower lung pneumonia/CHF. Patient had 5300 mL of urine output. Presently patient seems to be comfortable. Problem #4 hypertension: His blood pressure is reasonably controlled Problem #5 history of COPD: Patient on BiPAP Problem #6 hypothyroidism Problem #7 hypokalemia: Most likely from Lasix and presently patient is getting IV potassium. Problem #8 anemia Plan: We'll change IV fluid to normal saline with 20 mEq of KCl at 50 mL per hour We'll decrease Lasix to 40 mg once a  day. We'll check her basic metabolic panel today and in the morning.   LOS: 6 days   Malayiah Mcbrayer S 06/02/2014,7:19 AM

## 2014-06-02 NOTE — Progress Notes (Signed)
eLink Physician-Brief Progress Note Patient Name: Daisy Scott DOB: 1918-12-08 MRN: [id number]   Date of Service  06/02/2014  HPI/Events of Note  Hypokalemia  eICU Interventions  KCL po supp given     Intervention Category Intermediate Interventions: Electrolyte abnormality - evaluation and management  Shan Levansatrick Wright 06/02/2014, 12:49 AM

## 2014-06-02 NOTE — Consult Note (Signed)
Consult requested by: [facility] Consult requested for respiratory failure:  HPI: This is a 81 year old who came to her primary care physician's office with cough and congestion. Chest x-ray in his office showed bilateral pleural effusions and she was referred for management of a presumed community-acquired pneumonia. She was wheezing and having some mild respiratory distress. She was treated with antibiotics and steroids but continued to have difficulty. She eventually had enough problem with respiratory distress that she had been placed on BiPAP. Her situation is complicated by the fact that she's been hyponatremic and has what seems to be worsening chronic kidney disease has COPD, history of pulmonary emboli and now has acute congestive heart failure. Her renal function has improved and her urine output has improved. She is being treated with inhaled bronchodilators and IV steroids IV antibiotics.  Past Medical History  Diagnosis Date  . Essential hypertension   . Hypothyroidism   . Asthma   . Hyperlipidemia   . Gout   . CKD (chronic kidney disease) stage 3, GFR 30-59 ml/min   . Ascending aorta dilation     4.2 cm 2011   . Atrial fibrillation     Documented February 2016 - question paroxysmal      Family History  Problem Relation Age of Onset  . Family history unknown: Yes     History   Social History  . Marital Status: Widowed    Spouse Name: N/A  . Number of Children: N/A  . Years of Education: N/A   Social History Main Topics  . Smoking status: Never Smoker   . Smokeless tobacco: Never Used  . Alcohol Use: No  . Drug Use: No  . Sexual Activity: Not on file   Other Topics Concern  . None   Social History Narrative     ROS: Not obtainable    Objective: Vital signs in last 24 hours: Temp:  [97.3 F (36.3 C)-98.8 F (37.1 C)] 97.6 F (36.4 C) (02/14 0730) Pulse Rate:  [33-139] 79 (02/14 0800) Resp:  [15-28] 15 (02/14 0800) BP: (73-147)/(35-107)  119/49 mmHg (02/14 0800) SpO2:  [78 %-100 %] 96 % (02/14 0800) FiO2 (%):  [30 %] 30 % (02/14 0724) Weight:  [85.3 kg (188 lb 0.8 oz)] 85.3 kg (188 lb 0.8 oz) (02/14 0500) Weight change: -0.634 kg (-1 lb 6.4 oz) Last BM Date: 05/28/14  Intake/Output from previous day: 02/13 0701 - 02/14 0700 In: 2702.7 [P.O.:360; I.V.:1942.7; IV Piggyback:400] Out: 5300 [Urine:5300]  PHYSICAL EXAM She is sleepy but arousable. She is on BiPAP. She looks fairly comfortable. Her HEENT exam is mostly unremarkable when she is lying flat she has jugular venous distention. Her chest shows rhonchi and end-expiratory wheezes bilaterally. Her heart is regular and I do not hear an S3 gallop now but heart sounds are somewhat obscured by airway noise. Her abdomen is soft without masses. Central nervous system examination grossly intact.  Lab Results: Basic Metabolic Panel:  Recent Labs  16/10/96 0326 06/01/14 0921  06/01/14 2146 06/02/14 0304  NA 119* 124*  < > 128* 127*  K 4.1 4.6  < > 3.2* 3.0*  CL 91* 92*  < > 90* 95*  CO2 19 21  < > 26 26  GLUCOSE 108* 128*  < > 198* 153*  BUN 39* 39*  < > 37* 36*  CREATININE 1.47* 1.44*  < > 1.42* 1.33*  CALCIUM 7.5* 7.9*  < > 7.9* 6.9*  MG 1.8 1.8  --   --   --   < > =  values in this interval not displayed. Liver Function Tests: No results for input(s): AST, ALT, ALKPHOS, BILITOT, PROT, ALBUMIN in the last 72 hours. No results for input(s): LIPASE, AMYLASE in the last 72 hours. No results for input(s): AMMONIA in the last 72 hours. CBC:  Recent Labs  06/01/14 0326 06/02/14 0445  WBC 7.0 5.3  NEUTROABS 6.1  --   HGB 12.3 8.9*  HCT 35.7* 25.5*  MCV 91.3 89.5  PLT 203 189   Cardiac Enzymes: No results for input(s): CKTOTAL, CKMB, CKMBINDEX, TROPONINI in the last 72 hours. BNP: No results for input(s): PROBNP in the last 72 hours. D-Dimer: No results for input(s): DDIMER in the last 72 hours. CBG: No results for input(s): GLUCAP in the last 72  hours. Hemoglobin A1C: No results for input(s): HGBA1C in the last 72 hours. Fasting Lipid Panel: No results for input(s): CHOL, HDL, LDLCALC, TRIG, CHOLHDL, LDLDIRECT in the last 72 hours. Thyroid Function Tests: No results for input(s): TSH, T4TOTAL, FREET4, T3FREE, THYROIDAB in the last 72 hours. Anemia Panel: No results for input(s): VITAMINB12, FOLATE, FERRITIN, TIBC, IRON, RETICCTPCT in the last 72 hours. Coagulation: No results for input(s): LABPROT, INR in the last 72 hours. Urine Drug Screen: Drugs of Abuse  No results found for: LABOPIA, COCAINSCRNUR, LABBENZ, AMPHETMU, THCU, LABBARB  Alcohol Level: No results for input(s): ETH in the last 72 hours. Urinalysis: No results for input(s): COLORURINE, LABSPEC, PHURINE, GLUCOSEU, HGBUR, BILIRUBINUR, KETONESUR, PROTEINUR, UROBILINOGEN, NITRITE, LEUKOCYTESUR in the last 72 hours.  Invalid input(s): APPERANCEUR Misc. Labs:   ABGS:  Recent Labs  05/31/14 0807  PHART 7.315*  PO2ART 71.2*  TCO2 19.3  HCO3 21.0     MICROBIOLOGY: Recent Results (from the past 240 hour(s))  Culture, expectorated sputum-assessment     Status: None   Collection Time: 05/27/14  8:49 PM  Result Value Ref Range Status   Specimen Description SPUTUM  Final   Special Requests NONE  Final   Sputum evaluation   Final    THIS SPECIMEN IS ACCEPTABLE. RESPIRATORY CULTURE REPORT TO FOLLOW. PERFORMED AT [facility]    Report Status 05/27/2014 FINAL  Final  Culture, respiratory (NON-Expectorated)     Status: None   Collection Time: 05/27/14  8:49 PM  Result Value Ref Range Status   Specimen Description SPUTUM EXPECTORATED  Final   Special Requests NONE  Final   Gram Stain   Final    FEW WBC PRESENT,BOTH PMN AND MONONUCLEAR FEW SQUAMOUS EPITHELIAL CELLS PRESENT FEW GRAM POSITIVE COCCI IN PAIRS RARE GRAM NEGATIVE RODS Performed at [facility]    Culture   Final    NORMAL OROPHARYNGEAL FLORA Performed at [facility]    Report Status  05/30/2014 FINAL  Final  Culture, Urine     Status: None   Collection Time: 05/27/14  8:50 PM  Result Value Ref Range Status   Specimen Description URINE, CLEAN CATCH  Final   Special Requests NONE  Final   Colony Count NO GROWTH Performed at [facility]   Final   Culture NO GROWTH Performed at [facility]   Final   Report Status 05/29/2014 FINAL  Final  Rapid strep screen     Status: None   Collection Time: 05/28/14 12:25 PM  Result Value Ref Range Status   Streptococcus, Group A Screen (Direct) NEGATIVE NEGATIVE Final    Comment: (NOTE) A Rapid Antigen test may result negative if the antigen level in the sample is below the detection level  of this test. The FDA has not cleared this test as a stand-alone test therefore the rapid antigen negative result has reflexed to a Group A Strep culture.   Culture, Group A Strep     Status: None   Collection Time: 05/28/14 12:25 PM  Result Value Ref Range Status   Specimen Description THROAT  Final   Special Requests NONE  Final   Culture   Final    No Beta Hemolytic Streptococci Isolated Performed at [facility]    Report Status 05/30/2014 FINAL  Final  Culture, Urine     Status: None   Collection Time: 05/29/14  5:46 PM  Result Value Ref Range Status   Specimen Description URINE, CLEAN CATCH  Final   Special Requests NONE  Final   Colony Count NO GROWTH [facility]   Final   Culture NO GROWTH [facility] at [facility]   Final   Report Status 05/31/2014 FINAL  Final  MRSA PCR Screening     Status: None   Collection Time: 05/31/14 10:04 AM  Result Value Ref Range Status   MRSA by PCR NEGATIVE NEGATIVE Final    Comment:        The GeneXpert MRSA Assay (FDA approved for NASAL specimens only), is one component of a comprehensive MRSA colonization surveillance program. It is not intended to diagnose MRSA infection nor to guide or monitor treatment for MRSA  infections.     Studies/Results: Dg Chest Port 1 View  06/01/2014   CLINICAL DATA:  PICC line placement. Acute respiratory failure, pneumonia, cough, CHF, COPD.  EXAM: PORTABLE CHEST - 1 VIEW  COMPARISON:  05/31/2014 and prior radiographs  FINDINGS: A right PICC line is now noted with tip overlying the mid SVC.  Cardiomegaly and pulmonary vascular congestion again noted.  Stable opacities overlying the mid and lower right lung again noted.  There is no evidence of pneumothorax.  Mild bibasilar atelectasis is present.  IMPRESSION: Right PICC line with tip overlying the mid SVC.  Otherwise unchanged chest radiograph.   Electronically Signed   By: Harmon PierJeffrey  Hu M.D.   On: 06/01/2014 13:53    Medications:  Prior to Admission:  Prescriptions prior to admission  Medication Sig Dispense Refill Last Dose  . albuterol (PROVENTIL HFA;VENTOLIN HFA) 108 (90 BASE) MCG/ACT inhaler Inhale 2 puffs into the lungs every 6 (six) hours as needed for wheezing. 1 Inhaler 2 05/27/2014 at Unknown time  . allopurinol (ZYLOPRIM) 100 MG tablet TAKE TWO TABLETS BY MOUTH DAILY (Patient taking differently: Take 100 mg by mouth daily. ) 60 tablet 6 05/27/2014 at Unknown time  . amLODipine (NORVASC) 5 MG tablet TAKE ONE (1) TABLET EACH DAY 30 tablet 4 05/27/2014 at Unknown time  . aspirin EC 81 MG tablet Take 81 mg by mouth daily.   05/27/2014 at Unknown time  . azithromycin (ZITHROMAX) 250 MG tablet As directed (Patient taking differently: Take 250-500 mg by mouth daily. Take 2 tabs on day 1, then 1 tab daily until finished.  Starting 05/24/2014 x 5 days.) 6 tablet 0 05/27/2014 at Unknown time  . brinzolamide (AZOPT) 1 % ophthalmic suspension Place 1 drop into the right eye See admin instructions. Uses at 0800 and 1200.   05/27/2014 at Unknown time  . Cranberry 500 MG CAPS Take 1 capsule by mouth daily.   05/27/2014 at Unknown time  . levothyroxine (SYNTHROID, LEVOTHROID) 75 MCG tablet TAKE ONE TABLET EVERY MORNING (Patient taking differently:  Take 75  mcg by mouth daily before breakfast. ) 30 tablet 5 05/27/2014 at Unknown time  . loratadine (CLARITIN) 10 MG tablet Take 10 mg by mouth daily.   05/27/2014 at Unknown time  . LORazepam (ATIVAN) 0.5 MG tablet Take 1 tablet (0.5 mg total) by mouth 2 (two) times daily. As directed 30 tablet 5 05/27/2014 at Unknown time  . Multiple Vitamin (MULTIVITAMIN WITH MINERALS) TABS tablet Take 1 tablet by mouth daily.   05/27/2014 at Unknown time  . pantoprazole (PROTONIX) 40 MG tablet Take 1 tablet (40 mg total) by mouth daily. 30 tablet 5 05/27/2014 at Unknown time  . salmeterol (SEREVENT DISKUS) 50 MCG/DOSE diskus inhaler USE 1 INHALTAION TWICE DAILY (Patient taking differently: Inhale 1 puff into the lungs 2 (two) times daily. ) 60 g 5 05/27/2014 at Unknown time  . sertraline (ZOLOFT) 50 MG tablet TAKE 1/2 TABLET DAILY (Patient taking differently: Take 25 mg by mouth daily. ) 15 tablet 5 05/27/2014 at Unknown time  . theophylline (THEODUR) 300 MG 12 hr tablet Take 0.5 tablets (150 mg total) by mouth 2 (two) times daily. 1/2 tablet BID 60 tablet 5 05/27/2014 at Unknown time  . travoprost, benzalkonium, (TRAVATAN) 0.004 % ophthalmic solution Place 1 drop into the right eye at bedtime.   05/26/2014 at Unknown time   Scheduled: . antiseptic oral rinse  7 mL Mouth Rinse q12n4p  . aspirin EC  81 mg Oral Daily  . brinzolamide  1 drop Right Eye 2 times per day  . budesonide-formoterol  2 puff Inhalation BID  . chlorhexidine  15 mL Mouth Rinse BID  . diltiazem  240 mg Oral Daily  . enoxaparin (LOVENOX) injection  30 mg Subcutaneous Q24H  . furosemide  40 mg Intravenous Daily  . guaiFENesin  1,200 mg Oral BID  . ipratropium  0.5 mg Nebulization Q4H  . latanoprost  1 drop Right Eye QHS  . levalbuterol  0.63 mg Nebulization Q4H  . levofloxacin (LEVAQUIN) IV  750 mg Intravenous Q48H  . levothyroxine  75 mcg Oral QAC breakfast  . loratadine  10 mg Oral Daily  . LORazepam  0.5 mg Oral QHS  . methylPREDNISolone  (SOLU-MEDROL) injection  125 mg Intravenous Q6H  . pantoprazole  40 mg Oral Daily  . sodium chloride  500 mL Intravenous Once  . sodium chloride  10-40 mL Intracatheter Q12H   Continuous: . 0.9 % NaCl with KCl 20 mEq / L    . nitroGLYCERIN 5 mcg/min (06/02/14 0807)   ZOX:[id number] **OR** acetaminophen, benzonatate, guaiFENesin-dextromethorphan, hydrALAZINE, ipratropium-albuterol, levalbuterol, LORazepam, menthol-cetylpyridinium, ondansetron **OR** ondansetron (ZOFRAN) IV, sodium chloride  Assesment: She has acute respiratory failure requiring BiPAP. She had a positive d-dimer but her ventilation/perfusion lung scan did not show evidence of pulmonary emboli. She now has acute exacerbation of CHF. She's had atrial fibrillation but seems to be in sinus rhythm right now. She has a history of asthma/COPD. Principal Problem:   Acute respiratory failure Active Problems:   Hypertension   Hyperlipemia   Hypothyroid   CKD (chronic kidney disease)   Asthma, chronic   Hyponatremia   CAP (community acquired pneumonia)   Acute bronchitis   Hypoxia   Positive D dimer   New onset a-fib   Cardiomyopathy   Acute exacerbation of CHF (congestive heart failure); Probable   COPD exacerbation    Plan: She is on appropriate treatment. I would continue current dose of steroids today and then see if we can taper tomorrow. Continue with IV antibiotics and inhaled  bronchodilators and mucolytic's. I agree we can try her off BiPAP today and see how she does.    LOS: 6 days   Kaytlynn Kochan L 06/02/2014, 9:15 AM

## 2014-06-02 NOTE — Progress Notes (Addendum)
[facility] PROGRESS NOTE  Daisy Scott ZOX:[id number] DOB: June 30, 1918 DOA: 05/27/2014 PCP: Rudi HeapMOORE, DONALD, MD  Assessment/Plan: #1 acute respiratory failure Likely multifactorial secondary to worsening acute COPD exacerbation and community-acquired pneumonia versus bronchitis in the setting of A. fib with RVR and probable acute systolic CHF exacerbation. Patient did have an elevated d-dimer and concern for pulmonary emboli. Per daughter patient with prior history of pulmonary emboli and DVT several years ago. VQ scan negative for PE. Patient was placed on low-dose diuretics per cardiology for possible cardiac asthma. Lopressor has been changed to Cardizem. Rate control. Patient currently on aspirin. Patient noted to have a weight of 85.3kg from 83.1 kg from 77.2 kg on admission. IV Lasix have been started by nephrology currently on 40 IV every daily secondary to hyponatremia. Patient with good urine output.   ABG with a pH of 7.32 PCO2 of 43 PO2 of 71 bicarbonate of 21. Patient with improving work of breathing. Likely worsening COPD exacerbation. Continue IV Levaquin. Change IV Solu-Medrol to 125 mg IV every 8 hours. Chest PT. Trial off BiPAP.  Monitor closely.   #2 A. fib with RVR Patient with left bundle branch block by EKG with prior evidence of IVCD. A. fib with RVR could be secondary to acute respiratory tract infection. Patient's chads score is 5. Patient's daughter declines pursuing anticoagulation at this time. TSH is normal. 2-D echo with EF of 25-30%, with diffuse hypokinesis and septal dyssynergy. Lopressor has been changed to Cardizem per cardiology secondary to wheezing. Continue aspirin. Keep magnesium greater than 2. Cardiology following and appreciate input and recommendations.  #3 probable early community-acquired pneumonia versus bronchitis Patient with diffuse wheezing, rhonchorous breath sounds, increased work of breathing yesterday. Patient with some improvement on BiPAP.  Chest x-ray yesterday with questionable densities in the right hand, right lower lung region. Foci of airspace disease or infection cannot be excluded. Repeat chest x-ray tommorrow. Continue IV antibiotics. Continue nebulizer treatments. Continue Mucinex. Continue oxygen and Claritin.   #4 acute COPD exacerbation Patient with worsening shortness of breath yesterday with use of accessory muscles of respiration. Patient wheezing diffusely. Slight improvement. Patient with rhonchorous breath sounds. ABG with a pH of 7.3 PCO2 of 43 PO2 of 71 bicarbonate of 21. Change i IV steroids to Solu-Medrol 125 IV every 8 hours. Continue IV Levaquin. Continue Mucinex. Oxygen. Continue Xopenex and Atrovent nebulizers. Continue BiPAP. Repeat chest x-ray in the morning. Trial off BIPAP today.  #5 probable acute systolic CHF exacerbation/probable ischemic cardiomyopathy Patient was placed on low-dose IV Lasix per cardiology. Patient with mildly elevated troponins felt to be secondary to A. fib with RVR. 2-D echo with EF of 25-30% with diffuse hypokinesis and septal dyssynergy. Patient noted to have worsening hyponatremia and as such discontinued IV Lasix. Patient with increased weight since admission. Patient also noted to be hyponatremic which is felt to be secondary to hypervolemic hyponatremia. Patient has been resumed back on Lasix 60 mg IV every 12 hours. Urine output yesterday was 5.3 L. Patient is currently +452.4cc from this hospitalization. Patient also started on nitroglycerin infusion in case there is an ischemic component. Cardiology following and appreciate input and recommendations.  #6 chronic kidney disease stage II Stable. Follow.  #7 hypothyroidism TSH within normal limits. Continue home dose Synthroid.  #8 hyponatremia Hyponatremia questionable etiology. Likely secondary to hypervolemic hyponatremia. Improving and currently at 127. Patient's weight currently at 85.3kg from 83.7 kg from 77.5 kg on  admission. Urine sodium was less than 10. Urine osmolality is  low. Repeat chest x-ray with worsening infiltrates. Patient's sodium dropped as low as 115 currently at 127. Patient diuresed 5.3L yesterday. Patient has been seen by nephrology patient was started on Lasix 60 mg IV every 12 hours, normal saline. Fluid restriction. Lasix decreased to  daily and IVF rate decreased per renal. Continue serial BMETs. Nephrology following and appreciate input and recommendations.   #9 hypertension Stable. On Cardizem.  #10 positive d-dimer Patient with hypoxia and shortness of breath. Patient with prior history of PE and DVT per daughter. VQ scan negative for PE.  #11 hypokalemia Secondary to diureses. Replete.  #12 prophylaxis Lovenox for DVT prophylaxis.    Code Status: Full Family Communication: Updated patient, no family at bedside. Disposition Plan: Remain in stepdown unit.   Consultants:  Cardiology: Dr Diona Browner 05/28/14  Nephrology: Dr. Fausto Skillern 2/12/ 2016  Pulmonary: Dr Juanetta Gosling 06/02/14  Procedures:  2 d echo 05/28/14  Chest x-ray 05/27/2014    lower extremity Dopplers 05/28/2014 negative  VQ scan 05/29/2014  Antibiotics:  IV ROCEPHIN 05/27/2014>>>> 05/29/2014  IV AZITHROMYCIN 05/27/2014 >>>> 05/29/2014  Oral Augmentin 05/30/2014>>>> 05/31/2014  IV Levaquin 05/31/2014  HPI/Subjective: Patient on BiPAP. Some improvement with respiratory status.  Objective: Filed Vitals:   06/02/14 0800  BP: 119/49  Pulse: 79  Temp:   Resp: 15    Intake/Output Summary (Last 24 hours) at 06/02/14 0911 Last data filed at 06/02/14 9604  Gross per 24 hour  Intake 2756.24 ml  Output   4500 ml  Net -1743.76 ml   Filed Weights   06/01/14 0500 06/01/14 0808 06/02/14 0500  Weight: 83.7 kg (184 lb 8.4 oz) 83.1 kg (183 lb 3.2 oz) 85.3 kg (188 lb 0.8 oz)    Exam:   General:  On BiPAP. Drowsy  Cardiovascular: Irregularly irregular. Trace bilateral lower extremity edema.  Positive JVD.  Respiratory: Decreased coarse breath sounds. Expiratory wheezes.Less use of accessory muscles of respiration. On BiPAP.  Abdomen: Soft, nontender, nondistended, positive bowel sounds.  Musculoskeletal: No clubbing cyanosis or edema. Trace bilateral lower extremity edema.  Data Reviewed: Basic Metabolic Panel:  Recent Labs Lab 05/29/14 0802  05/30/14 0522  05/31/14 0518  06/01/14 0326 06/01/14 0921 06/01/14 1436 06/01/14 2146 06/02/14 0304  NA 122*  < > 120*  < > 118*  < > 119* 124* 124* 128* 127*  K 3.9  --  4.0  < > 4.3  < > 4.1 4.6 3.5 3.2* 3.0*  CL 89*  --  85*  < > 81*  < > 91* 92* 90* 90* 95*  CO2 24  --  24  < > 23  < > GLUCOSE 132*  --  121*  < > 132*  < > 108* 128* 117* 198* 153*  BUN 32*  --  39*  < > 42*  < > 39* 39* 39* 37* 36*  CREATININE 1.48*  --  1.49*  < > 1.55*  < > 1.47* 1.44* 1.40* 1.42* 1.33*  CALCIUM 8.2*  --  8.3*  < > 8.4  < > 7.5* 7.9* 7.7* 7.9* 6.9*  MG 1.5  --  2.0  --  1.9  --  1.8 1.8  --   --   --   < > = values in this interval not displayed. Liver Function Tests: No results for input(s): AST, ALT, ALKPHOS, BILITOT, PROT, ALBUMIN in the last 168 hours. No results for input(s): LIPASE, AMYLASE in the last 168 hours. No results for input(s):  AMMONIA in the last 168 hours. CBC:  Recent Labs Lab 05/29/14 0802 05/30/14 0522 05/31/14 0518 06/01/14 0326 06/02/14 0445  WBC 12.6* 9.4 9.2 7.0 5.3  NEUTROABS 11.2* 8.5*  --  6.1  --   HGB 12.6 12.1 12.3 12.3 8.9*  HCT 38.2 36.1 36.4 35.7* 25.5*  MCV 93.9 91.2 89.9 91.3 89.5  PLT 232 222 290 203 189   Cardiac Enzymes:  Recent Labs Lab 05/28/14 0635 05/28/14 1924 05/29/14 0100  TROPONINI 0.04* 0.05* 0.07*   BNP (last 3 results)  Recent Labs  05/31/14 0518 06/01/14 1436 06/02/14 0445  BNP 939.0* 1401.0* 816.0*    ProBNP (last 3 results) No results for input(s): PROBNP in the last 8760 hours.  CBG: No results for input(s): GLUCAP in the last 168  hours.  Recent Results (from the past 240 hour(s))  Culture, expectorated sputum-assessment     Status: None   Collection Time: 05/27/14  8:49 PM  Result Value Ref Range Status   Specimen Description SPUTUM  Final   Special Requests NONE  Final   Sputum evaluation   Final    THIS SPECIMEN IS ACCEPTABLE. RESPIRATORY CULTURE REPORT TO FOLLOW. PERFORMED AT [facility]    Report Status 05/27/2014 FINAL  Final  Culture, respiratory (NON-Expectorated)     Status: None   Collection Time: 05/27/14  8:49 PM  Result Value Ref Range Status   Specimen Description SPUTUM EXPECTORATED  Final   Special Requests NONE  Final   Gram Stain   Final    FEW WBC PRESENT,BOTH PMN AND MONONUCLEAR FEW SQUAMOUS EPITHELIAL CELLS PRESENT FEW GRAM POSITIVE COCCI IN PAIRS RARE GRAM NEGATIVE RODS Performed at [facility]    Culture   Final    NORMAL OROPHARYNGEAL FLORA Performed at [facility]    Report Status 05/30/2014 FINAL  Final  Culture, Urine     Status: None   Collection Time: 05/27/14  8:50 PM  Result Value Ref Range Status   Specimen Description URINE, CLEAN CATCH  Final   Special Requests NONE  Final   Colony Count NO GROWTH Performed at [facility]   Final   Culture NO GROWTH Performed at [facility]   Final   Report Status 05/29/2014 FINAL  Final  Rapid strep screen     Status: None   Collection Time: 05/28/14 12:25 PM  Result Value Ref Range Status   Streptococcus, Group A Screen (Direct) NEGATIVE NEGATIVE Final    Comment: (NOTE) A Rapid Antigen test may result negative if the antigen level in the sample is below the detection level of this test. The FDA has not cleared this test as a stand-alone test therefore the rapid antigen negative result has reflexed to a Group A Strep culture.   Culture, Group A Strep     Status: None   Collection Time: 05/28/14 12:25 PM  Result Value Ref Range Status   Specimen Description THROAT  Final   Special Requests  NONE  Final   Culture   Final    No Beta Hemolytic Streptococci Isolated Performed at [facility]    Report Status 05/30/2014 FINAL  Final  Culture, Urine     Status: None   Collection Time: 05/29/14  5:46 PM  Result Value Ref Range Status   Specimen Description URINE, CLEAN CATCH  Final   Special Requests NONE  Final   Colony Count NO GROWTH Performed at [facility]   Final   Culture NO  GROWTH Performed at [facility]   Final   Report Status 05/31/2014 FINAL  Final  MRSA PCR Screening     Status: None   Collection Time: 05/31/14 10:04 AM  Result Value Ref Range Status   MRSA by PCR NEGATIVE NEGATIVE Final    Comment:        The GeneXpert MRSA Assay (FDA approved for NASAL specimens only), is one component of a comprehensive MRSA colonization surveillance program. It is not intended to diagnose MRSA infection nor to guide or monitor treatment for MRSA infections.      Studies: Dg Chest Port 1 View  06/01/2014   CLINICAL DATA:  PICC line placement. Acute respiratory failure, pneumonia, cough, CHF, COPD.  EXAM: PORTABLE CHEST - 1 VIEW  COMPARISON:  05/31/2014 and prior radiographs  FINDINGS: A right PICC line is now noted with tip overlying the mid SVC.  Cardiomegaly and pulmonary vascular congestion again noted.  Stable opacities overlying the mid and lower right lung again noted.  There is no evidence of pneumothorax.  Mild bibasilar atelectasis is present.  IMPRESSION: Right PICC line with tip overlying the mid SVC.  Otherwise unchanged chest radiograph.   Electronically Signed   By: Harmon Pier M.D.   On: 06/01/2014 13:53    Scheduled Meds: . antiseptic oral rinse  7 mL Mouth Rinse q12n4p  . aspirin EC  81 mg Oral Daily  . brinzolamide  1 drop Right Eye 2 times per day  . budesonide-formoterol  2 puff Inhalation BID  . chlorhexidine  15 mL Mouth Rinse BID  . diltiazem  240 mg Oral Daily  . enoxaparin (LOVENOX) injection  30 mg Subcutaneous  Q24H  . furosemide  40 mg Intravenous Daily  . guaiFENesin  1,200 mg Oral BID  . ipratropium  0.5 mg Nebulization Q4H  . latanoprost  1 drop Right Eye QHS  . levalbuterol  0.63 mg Nebulization Q4H  . levofloxacin (LEVAQUIN) IV  750 mg Intravenous Q48H  . levothyroxine  75 mcg Oral QAC breakfast  . loratadine  10 mg Oral Daily  . LORazepam  0.5 mg Oral QHS  . methylPREDNISolone (SOLU-MEDROL) injection  125 mg Intravenous Q6H  . pantoprazole  40 mg Oral Daily  . sodium chloride  500 mL Intravenous Once  . sodium chloride  10-40 mL Intracatheter Q12H   Continuous Infusions: . 0.9 % NaCl with KCl 20 mEq / L    . nitroGLYCERIN 5 mcg/min (06/02/14 0807)    Principal Problem:   Acute respiratory failure Active Problems:   COPD exacerbation   CAP (community acquired pneumonia)   Acute bronchitis   New onset a-fib   Hypertension   Hyperlipemia   Hypothyroid   CKD (chronic kidney disease)   Asthma, chronic   Hyponatremia   Hypoxia   Positive D dimer   Cardiomyopathy   Acute exacerbation of CHF (congestive heart failure); Probable    Time spent: 40 mins    [facility] MD [facility] Pager [phone]. If 7PM-7AM, please contact night-coverage at [email or web address], password [facility] 06/02/2014, 9:11 AM  LOS: 6 days

## 2014-06-02 NOTE — Plan of Care (Signed)
Problem: Phase II Progression Outcomes Goal: Encourage coughing & deep breathing Outcome: Progressing PT IS COUGHING AND DEEP BREATHING . NPC. Goal: Wean O2 if indicated Outcome: Progressing PT IS TOLERATING BEING OFF BIPAP FOR LONGER PERIODS OF TIME Goal: Discharge plan established Outcome: Not Progressing UNKNOWN AT THIS TIME IF PT WILL REQUIRE SNF PLACEMENT Goal: Tolerating diet Outcome: Progressing PT IS NOW ALERT ENOUGH TO EAT SMALL AMT OF FOOD AT MEALS Goal: If HF, initiate HF Core Reminder Form Outcome: Progressing FAMILY HAS GOOD UNDERSTANDING OF  CHF SX AND TX

## 2014-06-03 ENCOUNTER — Inpatient Hospital Stay (HOSPITAL_COMMUNITY): Payer: Medicare Other

## 2014-06-03 LAB — BASIC METABOLIC PANEL
Anion gap: 3 — ABNORMAL LOW (ref 5–15)
BUN: 28 mg/dL — ABNORMAL HIGH (ref 6–23)
CALCIUM: 6.7 mg/dL — AB (ref 8.4–10.5)
CO2: 26 mmol/L (ref 19–32)
Chloride: 101 mmol/L (ref 96–112)
Creatinine, Ser: 1.05 mg/dL (ref 0.50–1.10)
GFR calc Af Amer: 51 mL/min — ABNORMAL LOW (ref >90)
GFR calc non Af Amer: 44 mL/min — ABNORMAL LOW (ref >90)
Glucose, Bld: 133 mg/dL — ABNORMAL HIGH (ref 70–99)
Potassium: 4.9 mmol/L (ref 3.5–5.1)
Sodium: 130 mmol/L — ABNORMAL LOW (ref 135–145)

## 2014-06-03 LAB — CBC
HCT: 29.3 % — ABNORMAL LOW (ref 36.0–46.0)
Hemoglobin: 10.2 g/dL — ABNORMAL LOW (ref 12.0–15.0)
MCH: 31.5 pg (ref 26.0–34.0)
MCHC: 34.8 g/dL (ref 30.0–36.0)
MCV: 90.4 fL (ref 78.0–100.0)
PLATELETS: 228 10*3/uL (ref 150–400)
RBC: 3.24 MIL/uL — ABNORMAL LOW (ref 3.87–5.11)
RDW: 14.7 % (ref 11.5–15.5)
WBC: 7.6 10*3/uL (ref 4.0–10.5)

## 2014-06-03 MED ORDER — LEVOFLOXACIN 750 MG PO TABS
750.0000 mg | ORAL_TABLET | ORAL | Status: AC
  Administered 2014-06-04 – 2014-06-05 (×2): 750 mg via ORAL
  Filled 2014-06-03 (×2): qty 1

## 2014-06-03 MED ORDER — ENOXAPARIN SODIUM 40 MG/0.4ML ~~LOC~~ SOLN
40.0000 mg | SUBCUTANEOUS | Status: DC
  Administered 2014-06-04 – 2014-06-06 (×3): 40 mg via SUBCUTANEOUS
  Filled 2014-06-03 (×3): qty 0.4

## 2014-06-03 MED ORDER — FUROSEMIDE 10 MG/ML IJ SOLN
20.0000 mg | Freq: Every day | INTRAMUSCULAR | Status: DC
  Administered 2014-06-03 – 2014-06-05 (×3): 20 mg via INTRAVENOUS
  Filled 2014-06-03 (×3): qty 2

## 2014-06-03 NOTE — Progress Notes (Signed)
Subjective: Interval History: She denies any difficulty breathing  Objective: Vital signs in last 24 hours: Temp:  [97.2 F (36.2 C)-98.4 F (36.9 C)] 97.2 F (36.2 C) (02/15 0800) Pulse Rate:  [40-96] 82 (02/15 0800) Resp:  [13-28] 27 (02/15 0800) BP: (110-148)/(53-112) 112/68 mmHg (02/15 0500) SpO2:  [89 %-100 %] 95 % (02/15 0800) FiO2 (%):  [30 %] 30 % (02/15 0708) Weight:  [83.1 kg (183 lb 3.2 oz)] 83.1 kg (183 lb 3.2 oz) (02/15 0500) Weight change: 0 kg (0 lb)  Intake/Output from previous day: 02/14 0701 - 02/15 0700 In: 2166.5 [P.O.:810; I.V.:1106.5; IV Piggyback:250] Out: 4600 [Urine:4600] Intake/Output this shift:   generally patient is still sleepy but arousable and respond by shaking her head for yes and no. Patient still with elevated JVD Chest: Expiratory wheezing Extremities: Trace edema    Lab Results:  Recent Labs  06/02/14 0445 06/03/14 0440  WBC 5.3 7.6  HGB 8.9* 10.2*  HCT 25.5* 29.3*  PLT 189 228   BMET:   Recent Labs  06/02/14 1453 06/03/14 0440  NA 128* 130*  K 4.0 4.9  CL 92* 101  CO2 29 26  GLUCOSE 106* 133*  BUN 38* 28*  CREATININE 1.29* 1.05  CALCIUM 7.7* 6.7*   No results for input(s): PTH in the last 72 hours. Iron Studies: No results for input(s): IRON, TIBC, TRANSFERRIN, FERRITIN in the last 72 hours.  Studies/Results: Dg Chest Port 1 View  06/03/2014   CLINICAL DATA:  Short of breath.  Followup abnormal lung density.  EXAM: PORTABLE CHEST - 1 VIEW  COMPARISON:  06/01/2014 and previous  FINDINGS: Right arm PICC has its tip in the SVC at the azygos level. Artifact overlies the chest elsewhere. The heart is enlarged. The aorta is unfolded. Abnormal density persists in both lower lobes consistent with atelectasis or patchy pneumonia. No new finding.  IMPRESSION: Persistent abnormal density in both lower lobes consistent with atelectasis and or pneumonia. No change.   Electronically Signed   By: Paulina FusiMark  Shogry M.D.   On: 06/03/2014  07:21   Dg Chest Port 1 View  06/01/2014   CLINICAL DATA:  PICC line placement. Acute respiratory failure, pneumonia, cough, CHF, COPD.  EXAM: PORTABLE CHEST - 1 VIEW  COMPARISON:  05/31/2014 and prior radiographs  FINDINGS: A right PICC line is now noted with tip overlying the mid SVC.  Cardiomegaly and pulmonary vascular congestion again noted.  Stable opacities overlying the mid and lower right lung again noted.  There is no evidence of pneumothorax.  Mild bibasilar atelectasis is present.  IMPRESSION: Right PICC line with tip overlying the mid SVC.  Otherwise unchanged chest radiograph.   Electronically Signed   By: Harmon PierJeffrey  Hu M.D.   On: 06/01/2014 13:53    I have reviewed the patient's current medications.  Assessment/Plan: Problem #1 hyponatremia: Possibly hypervolemic hyponatremia. Her sodium has improved progressively. It's 130. Problem #2 renal failure: Possibly chronic her BUN and creatinine progressively has improved. Possibly has returned to her baseline. Problem #3 difficulty in breathing: Possibly a combination of exacerbation of COPD/right hilum and right lower lung pneumonia/CHF. Patient had 4600 mL of urine output. Her difficulty breathing seems to have improved. Problem #4 hypertension: His blood pressure is reasonably controlled Problem #5 history of COPD: Patient on BiPAP Problem #6 hypothyroidism Problem #7 hypokalemia: Most likely from Lasix . Her potassium has corrected. Problem #8 anemia Plan: We'll DC IV fluid Decrease Lasix 20 mg IV once a day Since her renal  function has recovered and sodium has improved however sign off.  Thank you  LOS: 7 days   Denesia Donelan S 06/03/2014,8:21 AM

## 2014-06-03 NOTE — Progress Notes (Signed)
Patient ID: Daisy Scott, female   DOB: Feb 09, 1919, 81 y.o.   MRN: [id number]      Seen for followup: Atrial fibrillation, cardiomyopathy  Subjective:    Lethargic  Off bipap  Using accessory muscles   Objective:   Temp:  [97.2 F (36.2 C)-98.4 F (36.9 C)] 97.2 F (36.2 C) (02/15 0800) Pulse Rate:  [40-96] 78 (02/15 0950) Resp:  [13-28] 20 (02/15 0950) BP: (90-148)/(39-112) 109/45 mmHg (02/15 0950) SpO2:  [89 %-100 %] 93 % (02/15 0950) FiO2 (%):  [30 %] 30 % (02/15 0708) Weight:  [83.1 kg (183 lb 3.2 oz)] 83.1 kg (183 lb 3.2 oz) (02/15 0500) Last BM Date: 05/28/14  Filed Weights   06/01/14 0808 06/02/14 0500 06/03/14 0500  Weight: 83.1 kg (183 lb 3.2 oz) 85.3 kg (188 lb 0.8 oz) 83.1 kg (183 lb 3.2 oz)    Intake/Output Summary (Last 24 hours) at 06/03/14 1030 Last data filed at 06/03/14 1000  Gross per 24 hour  Intake   1737 ml  Output   4400 ml  Net  -2663 ml    Telemetry: SR rate 80's    Exam:  General: Elderly woman, comfortable but using accessory muscles   Lungs: Prolonged expiratory phase with active wheezing and scattered rhonchi.  Cardiac: Indistinct PMI, irregularly regular with ectopy, no gallop.  Abdomen: Soft, decreased bowel sounds, nontender.  Extremities: Trace leg edema.   Lab Results:  Basic Metabolic Panel:  Recent Labs Lab 05/31/14 0518  06/01/14 0326 06/01/14 0921  06/02/14 0304 06/02/14 1453 06/03/14 0440  NA 118*  < > 119* 124*  < > 127* 128* 130*  K 4.3  < > 4.1 4.6  < > 3.0* 4.0 4.9  CL 81*  < > 91* 92*  < > 95* 92* 101  CO2 23  < > 19 21  < > 26 29 26   GLUCOSE 132*  < > 108* 128*  < > 153* 106* 133*  BUN 42*  < > 39* 39*  < > 36* 38* 28*  CREATININE 1.55*  < > 1.47* 1.44*  < > 1.33* 1.29* 1.05  CALCIUM 8.4  < > 7.5* 7.9*  < > 6.9* 7.7* 6.7*  MG 1.9  --  1.8 1.8  --   --   --   --   < > = values in this interval not displayed.  CBC:  Recent Labs Lab 06/01/14 0326 06/02/14 0445 06/03/14 0440  WBC 7.0 5.3 7.6    HGB 12.3 8.9* 10.2*  HCT 35.7* 25.5* 29.3*  MCV 91.3 89.5 90.4  PLT 203 189 228    Cardiac Enzymes:  Recent Labs Lab 05/28/14 0635 05/28/14 1924 05/29/14 0100  TROPONINI 0.04* 0.05* 0.07*    Imaging: EXAM: PORTABLE CHEST - 1 VIEW  COMPARISON: 05/30/2014 and 05/27/2014.  FINDINGS: There appears to be increased densities in the right hilum and medial right lung base. Heart size is upper limits of normal but minimally changed. Upper lungs appear to be clear.  IMPRESSION: Questionable densities in the right hilum and right lower lung region. Foci of airspace disease or infection cannot be excluded. Recommend continued follow-up.   Medications:   Scheduled Medications: . antiseptic oral rinse  7 mL Mouth Rinse q12n4p  . aspirin EC  81 mg Oral Daily  . brinzolamide  1 drop Right Eye 2 times per day  . budesonide-formoterol  2 puff Inhalation BID  . chlorhexidine  15 mL Mouth Rinse BID  . diltiazem  240  mg Oral Daily  . enoxaparin (LOVENOX) injection  30 mg Subcutaneous Q24H  . furosemide  20 mg Intravenous Daily  . guaiFENesin  1,200 mg Oral BID  . ipratropium  0.5 mg Nebulization Q4H  . latanoprost  1 drop Right Eye QHS  . levalbuterol  0.63 mg Nebulization Q4H  . levofloxacin (LEVAQUIN) IV  750 mg Intravenous Q48H  . levothyroxine  75 mcg Oral QAC breakfast  . loratadine  10 mg Oral Daily  . LORazepam  0.5 mg Oral QHS  . methylPREDNISolone (SOLU-MEDROL) injection  125 mg Intravenous 3 times per day  . pantoprazole  40 mg Oral Daily  . sodium chloride  500 mL Intravenous Once  . sodium chloride  10-40 mL Intracatheter Q12H    Infusions: . nitroGLYCERIN 5 mcg/min (06/03/14 0400)    PRN Medications: acetaminophen **OR** acetaminophen, benzonatate, guaiFENesin-dextromethorphan, hydrALAZINE, ipratropium-albuterol, levalbuterol, LORazepam, menthol-cetylpyridinium, ondansetron **OR** ondansetron (ZOFRAN) IV, sodium chloride   Assessment:   1. Atrial  fibrillation,  maint NSR continue asa and cardizem   2. Cardiomyopathy, LVEF 25-30% with diffuse hypokinesis and near akinesis in the mid to apical anteroseptal and mid to basal inferolateral wall. We initially started beta blocker instead of calcium channel blocker, however wheezing was worse. On lasix iv daily and sodium ok for now  3. Hyponatremia,  Up to 130 improved   4. Bronchitis and asthma exacerbation, inhalers steroids and bipap  Per primary service   5. DNR status.   Plan/Discussion:    With low BP and no chest pain I think we can stop iv nitro

## 2014-06-03 NOTE — Progress Notes (Signed)
Subjective: She is overall about the same. She remains on BiPAP.  Objective: Vital signs in last 24 hours: Temp:  [97.5 F (36.4 C)-98.4 F (36.9 C)] 97.5 F (36.4 C) (02/15 0400) Pulse Rate:  [40-96] 84 (02/15 0725) Resp:  [13-28] 17 (02/15 0725) BP: (110-148)/(49-112) 112/68 mmHg (02/15 0500) SpO2:  [89 %-100 %] 96 % (02/15 0725) FiO2 (%):  [30 %] 30 % (02/15 0708) Weight:  [83.1 kg (183 lb 3.2 oz)] 83.1 kg (183 lb 3.2 oz) (02/15 0500) Weight change: 0 kg (0 lb) Last BM Date: 05/28/14  Intake/Output from previous day: 02/14 0701 - 02/15 0700 In: 2166.5 [P.O.:810; I.V.:1106.5; IV Piggyback:250] Out: 4600 [Urine:4600]  PHYSICAL EXAM General appearance: She opens her eyes when spoken to but does not speak. She is on BiPAP Resp: rhonchi bilaterally Cardio: irregularly irregular rhythm GI: soft, non-tender; bowel sounds normal; no masses,  no organomegaly Extremities: extremities normal, atraumatic, no cyanosis or edema  Lab Results:  Results for orders placed or performed during the hospital encounter of 05/27/14 (from the past 48 hour(s))  Basic metabolic panel     Status: Abnormal   Collection Time: 06/01/14  9:21 AM  Result Value Ref Range   Sodium 124 (Scott) 135 - 145 mmol/Scott   Potassium 4.6 3.5 - 5.1 mmol/Scott   Chloride 92 (Scott) 96 - 112 mmol/Scott   CO2 21 19 - 32 mmol/Scott   Glucose, Bld 128 (H) 70 - 99 mg/dL   BUN 39 (H) 6 - 23 mg/dL   Creatinine, Ser 1.44 (H) 0.50 - 1.10 mg/dL   Calcium 7.9 (Scott) 8.4 - 10.5 mg/dL   GFR calc non Af Amer 30 (Scott) >90 mL/min   GFR calc Af Amer 35 (Scott) >90 mL/min    Comment: (NOTE) The eGFR has been calculated using the CKD EPI equation. This calculation has not been validated in all clinical situations. eGFR's persistently <90 mL/min signify possible Chronic Kidney Disease.    Anion gap 11 5 - 15  Magnesium     Status: None   Collection Time: 06/01/14  9:21 AM  Result Value Ref Range   Magnesium 1.8 1.5 - 2.5 mg/dL  Basic metabolic panel      Status: Abnormal   Collection Time: 06/01/14  2:36 PM  Result Value Ref Range   Sodium 124 (Scott) 135 - 145 mmol/Scott   Potassium 3.5 3.5 - 5.1 mmol/Scott    Comment: DELTA CHECK NOTED   Chloride 90 (Scott) 96 - 112 mmol/Scott   CO2 25 19 - 32 mmol/Scott   Glucose, Bld 117 (H) 70 - 99 mg/dL   BUN 39 (H) 6 - 23 mg/dL   Creatinine, Ser 1.40 (H) 0.50 - 1.10 mg/dL   Calcium 7.7 (Scott) 8.4 - 10.5 mg/dL   GFR calc non Af Amer 31 (Scott) >90 mL/min   GFR calc Af Amer 36 (Scott) >90 mL/min    Comment: (NOTE) The eGFR has been calculated using the CKD EPI equation. This calculation has not been validated in all clinical situations. eGFR's persistently <90 mL/min signify possible Chronic Kidney Disease.    Anion gap 9 5 - 15  Brain natriuretic peptide     Status: Abnormal   Collection Time: 06/01/14  2:36 PM  Result Value Ref Range   B Natriuretic Peptide 1401.0 (H) 0.0 - 100.0 pg/mL  Basic metabolic panel     Status: Abnormal   Collection Time: 06/01/14  9:46 PM  Result Value Ref Range   Sodium 128 (Scott)  135 - 145 mmol/Scott   Potassium 3.2 (Scott) 3.5 - 5.1 mmol/Scott   Chloride 90 (Scott) 96 - 112 mmol/Scott   CO2 26 19 - 32 mmol/Scott   Glucose, Bld 198 (H) 70 - 99 mg/dL   BUN 37 (H) 6 - 23 mg/dL   Creatinine, Ser 1.42 (H) 0.50 - 1.10 mg/dL   Calcium 7.9 (Scott) 8.4 - 10.5 mg/dL   GFR calc non Af Amer 31 (Scott) >90 mL/min   GFR calc Af Amer 36 (Scott) >90 mL/min    Comment: (NOTE) The eGFR has been calculated using the CKD EPI equation. This calculation has not been validated in all clinical situations. eGFR's persistently <90 mL/min signify possible Chronic Kidney Disease.    Anion gap 12 5 - 15  Basic metabolic panel     Status: Abnormal   Collection Time: 06/02/14  3:04 AM  Result Value Ref Range   Sodium 127 (Scott) 135 - 145 mmol/Scott   Potassium 3.0 (Scott) 3.5 - 5.1 mmol/Scott   Chloride 95 (Scott) 96 - 112 mmol/Scott   CO2 26 19 - 32 mmol/Scott   Glucose, Bld 153 (H) 70 - 99 mg/dL   BUN 36 (H) 6 - 23 mg/dL   Creatinine, Ser 1.33 (H) 0.50 - 1.10 mg/dL    Calcium 6.9 (Scott) 8.4 - 10.5 mg/dL   GFR calc non Af Amer 33 (Scott) >90 mL/min   GFR calc Af Amer 39 (Scott) >90 mL/min    Comment: (NOTE) The eGFR has been calculated using the CKD EPI equation. This calculation has not been validated in all clinical situations. eGFR's persistently <90 mL/min signify possible Chronic Kidney Disease.    Anion gap 6 5 - 15  CBC     Status: Abnormal   Collection Time: 06/02/14  4:45 AM  Result Value Ref Range   WBC 5.3 4.0 - 10.5 K/uL   RBC 2.85 (Scott) 3.87 - 5.11 MIL/uL   Hemoglobin 8.9 (Scott) 12.0 - 15.0 g/dL    Comment: DELTA CHECK NOTED RESULT REPEATED AND VERIFIED    HCT 25.5 (Scott) 36.0 - 46.0 %   MCV 89.5 78.0 - 100.0 fL   MCH 31.2 26.0 - 34.0 pg   MCHC 34.9 30.0 - 36.0 g/dL   RDW 14.4 11.5 - 15.5 %   Platelets 189 150 - 400 K/uL  Brain natriuretic peptide     Status: Abnormal   Collection Time: 06/02/14  4:45 AM  Result Value Ref Range   B Natriuretic Peptide 816.0 (H) 0.0 - 100.0 pg/mL  Basic metabolic panel     Status: Abnormal   Collection Time: 06/02/14  2:53 PM  Result Value Ref Range   Sodium 128 (Scott) 135 - 145 mmol/Scott   Potassium 4.0 3.5 - 5.1 mmol/Scott    Comment: DELTA CHECK NOTED   Chloride 92 (Scott) 96 - 112 mmol/Scott   CO2 29 19 - 32 mmol/Scott   Glucose, Bld 106 (H) 70 - 99 mg/dL   BUN 38 (H) 6 - 23 mg/dL   Creatinine, Ser 1.29 (H) 0.50 - 1.10 mg/dL   Calcium 7.7 (Scott) 8.4 - 10.5 mg/dL   GFR calc non Af Amer 35 (Scott) >90 mL/min   GFR calc Af Amer 40 (Scott) >90 mL/min    Comment: (NOTE) The eGFR has been calculated using the CKD EPI equation. This calculation has not been validated in all clinical situations. eGFR's persistently <90 mL/min signify possible Chronic Kidney Disease.    Anion gap 7 5 -  15  Basic metabolic panel     Status: Abnormal   Collection Time: 06/03/14  4:40 AM  Result Value Ref Range   Sodium 130 (Scott) 135 - 145 mmol/Scott   Potassium 4.9 3.5 - 5.1 mmol/Scott    Comment: DELTA CHECK NOTED   Chloride 101 96 - 112 mmol/Scott   CO2 26 19 - 32  mmol/Scott   Glucose, Bld 133 (H) 70 - 99 mg/dL   BUN 28 (H) 6 - 23 mg/dL   Creatinine, Ser 1.05 0.50 - 1.10 mg/dL   Calcium 6.7 (Scott) 8.4 - 10.5 mg/dL   GFR calc non Af Amer 44 (Scott) >90 mL/min   GFR calc Af Amer 51 (Scott) >90 mL/min    Comment: (NOTE) The eGFR has been calculated using the CKD EPI equation. This calculation has not been validated in all clinical situations. eGFR's persistently <90 mL/min signify possible Chronic Kidney Disease.    Anion gap 3 (Scott) 5 - 15  CBC     Status: Abnormal   Collection Time: 06/03/14  4:40 AM  Result Value Ref Range   WBC 7.6 4.0 - 10.5 K/uL   RBC 3.24 (Scott) 3.87 - 5.11 MIL/uL   Hemoglobin 10.2 (Scott) 12.0 - 15.0 g/dL   HCT 29.3 (Scott) 36.0 - 46.0 %   MCV 90.4 78.0 - 100.0 fL   MCH 31.5 26.0 - 34.0 pg   MCHC 34.8 30.0 - 36.0 g/dL   RDW 14.7 11.5 - 15.5 %   Platelets 228 150 - 400 K/uL    ABGS  Recent Labs  05/31/14 0807  PHART 7.315*  PO2ART 71.2*  TCO2 19.3  HCO3 21.0   CULTURES Recent Results (from the past 240 hour(s))  Culture, expectorated sputum-assessment     Status: None   Collection Time: 05/27/14  8:49 PM  Result Value Ref Range Status   Specimen Description SPUTUM  Final   Special Requests NONE  Final   Sputum evaluation   Final    THIS SPECIMEN IS ACCEPTABLE. RESPIRATORY CULTURE REPORT TO FOLLOW. PERFORMED AT [facility]    Report Status 05/27/2014 FINAL  Final  Culture, respiratory (NON-Expectorated)     Status: None   Collection Time: 05/27/14  8:49 PM  Result Value Ref Range Status   Specimen Description SPUTUM EXPECTORATED  Final   Special Requests NONE  Final   Gram Stain   Final    FEW WBC PRESENT,BOTH PMN AND MONONUCLEAR FEW SQUAMOUS EPITHELIAL CELLS PRESENT FEW GRAM POSITIVE COCCI IN PAIRS RARE GRAM NEGATIVE RODS Performed at [facility]    Culture   Final    NORMAL OROPHARYNGEAL FLORA Performed at [facility]    Report Status 05/30/2014 FINAL  Final  Culture, Urine     Status: None   Collection  Time: 05/27/14  8:50 PM  Result Value Ref Range Status   Specimen Description URINE, CLEAN CATCH  Final   Special Requests NONE  Final   Colony Count NO GROWTH Performed at [facility]   Final   Culture NO GROWTH Performed at [facility]   Final   Report Status 05/29/2014 FINAL  Final  Rapid strep screen     Status: None   Collection Time: 05/28/14 12:25 PM  Result Value Ref Range Status   Streptococcus, Group A Screen (Direct) NEGATIVE NEGATIVE Final    Comment: (NOTE) A Rapid Antigen test may result negative if the antigen level in the sample is below the detection level of this  test. The FDA has not cleared this test as a stand-alone test therefore the rapid antigen negative result has reflexed to a Group A Strep culture.   Culture, Group A Strep     Status: None   Collection Time: 05/28/14 12:25 PM  Result Value Ref Range Status   Specimen Description THROAT  Final   Special Requests NONE  Final   Culture   Final    No Beta Hemolytic Streptococci Isolated Performed at [facility]    Report Status 05/30/2014 FINAL  Final  Culture, Urine     Status: None   Collection Time: 05/29/14  5:46 PM  Result Value Ref Range Status   Specimen Description URINE, CLEAN CATCH  Final   Special Requests NONE  Final   Colony Count NO GROWTH Performed at [facility]   Final   Culture NO GROWTH Performed at [facility]   Final   Report Status 05/31/2014 FINAL  Final  MRSA PCR Screening     Status: None   Collection Time: 05/31/14 10:04 AM  Result Value Ref Range Status   MRSA by PCR NEGATIVE NEGATIVE Final    Comment:        The GeneXpert MRSA Assay (FDA approved for NASAL specimens only), is one component of a comprehensive MRSA colonization surveillance program. It is not intended to diagnose MRSA infection nor to guide or monitor treatment for MRSA infections.    Studies/Results: Dg Chest Port 1 View  06/03/2014   CLINICAL  DATA:  Short of breath.  Followup abnormal lung density.  EXAM: PORTABLE CHEST - 1 VIEW  COMPARISON:  06/01/2014 and previous  FINDINGS: Right arm PICC has its tip in the SVC at the azygos level. Artifact overlies the chest elsewhere. The heart is enlarged. The aorta is unfolded. Abnormal density persists in both lower lobes consistent with atelectasis or patchy pneumonia. No new finding.  IMPRESSION: Persistent abnormal density in both lower lobes consistent with atelectasis and or pneumonia. No change.   Electronically Signed   By: Nelson Chimes M.D.   On: 06/03/2014 07:21   Dg Chest Port 1 View  06/01/2014   CLINICAL DATA:  PICC line placement. Acute respiratory failure, pneumonia, cough, CHF, COPD.  EXAM: PORTABLE CHEST - 1 VIEW  COMPARISON:  05/31/2014 and prior radiographs  FINDINGS: A right PICC line is now noted with tip overlying the mid SVC.  Cardiomegaly and pulmonary vascular congestion again noted.  Stable opacities overlying the mid and lower right lung again noted.  There is no evidence of pneumothorax.  Mild bibasilar atelectasis is present.  IMPRESSION: Right PICC line with tip overlying the mid SVC.  Otherwise unchanged chest radiograph.   Electronically Signed   By: Margarette Canada M.D.   On: 06/01/2014 13:53    Medications:  Prior to Admission:  Prescriptions prior to admission  Medication Sig Dispense Refill Last Dose  . albuterol (PROVENTIL HFA;VENTOLIN HFA) 108 (90 BASE) MCG/ACT inhaler Inhale 2 puffs into the lungs every 6 (six) hours as needed for wheezing. 1 Inhaler 2 05/27/2014 at Unknown time  . allopurinol (ZYLOPRIM) 100 MG tablet TAKE TWO TABLETS BY MOUTH DAILY (Patient taking differently: Take 100 mg by mouth daily. ) 60 tablet 6 05/27/2014 at Unknown time  . amLODipine (NORVASC) 5 MG tablet TAKE ONE (1) TABLET EACH DAY 30 tablet 4 05/27/2014 at Unknown time  . aspirin EC 81 MG tablet Take 81 mg by mouth daily.   05/27/2014 at Unknown time  .  azithromycin (ZITHROMAX) 250 MG tablet As  directed (Patient taking differently: Take 250-500 mg by mouth daily. Take 2 tabs on day 1, then 1 tab daily until finished.  Starting 05/24/2014 x 5 days.) 6 tablet 0 05/27/2014 at Unknown time  . brinzolamide (AZOPT) 1 % ophthalmic suspension Place 1 drop into the right eye See admin instructions. Uses at 0800 and 1200.   05/27/2014 at Unknown time  . Cranberry 500 MG CAPS Take 1 capsule by mouth daily.   05/27/2014 at Unknown time  . levothyroxine (SYNTHROID, LEVOTHROID) 75 MCG tablet TAKE ONE TABLET EVERY MORNING (Patient taking differently: Take 75 mcg by mouth daily before breakfast. ) 30 tablet 5 05/27/2014 at Unknown time  . loratadine (CLARITIN) 10 MG tablet Take 10 mg by mouth daily.   05/27/2014 at Unknown time  . LORazepam (ATIVAN) 0.5 MG tablet Take 1 tablet (0.5 mg total) by mouth 2 (two) times daily. As directed 30 tablet 5 05/27/2014 at Unknown time  . Multiple Vitamin (MULTIVITAMIN WITH MINERALS) TABS tablet Take 1 tablet by mouth daily.   05/27/2014 at Unknown time  . pantoprazole (PROTONIX) 40 MG tablet Take 1 tablet (40 mg total) by mouth daily. 30 tablet 5 05/27/2014 at Unknown time  . salmeterol (SEREVENT DISKUS) 50 MCG/DOSE diskus inhaler USE 1 INHALTAION TWICE DAILY (Patient taking differently: Inhale 1 puff into the lungs 2 (two) times daily. ) 60 g 5 05/27/2014 at Unknown time  . sertraline (ZOLOFT) 50 MG tablet TAKE 1/2 TABLET DAILY (Patient taking differently: Take 25 mg by mouth daily. ) 15 tablet 5 05/27/2014 at Unknown time  . theophylline (THEODUR) 300 MG 12 hr tablet Take 0.5 tablets (150 mg total) by mouth 2 (two) times daily. 1/2 tablet BID 60 tablet 5 05/27/2014 at Unknown time  . travoprost, benzalkonium, (TRAVATAN) 0.004 % ophthalmic solution Place 1 drop into the right eye at bedtime.   05/26/2014 at Unknown time   Scheduled: . antiseptic oral rinse  7 mL Mouth Rinse q12n4p  . aspirin EC  81 mg Oral Daily  . brinzolamide  1 drop Right Eye 2 times per day  . budesonide-formoterol  2 puff  Inhalation BID  . chlorhexidine  15 mL Mouth Rinse BID  . diltiazem  240 mg Oral Daily  . enoxaparin (LOVENOX) injection  30 mg Subcutaneous Q24H  . furosemide  40 mg Intravenous Daily  . guaiFENesin  1,200 mg Oral BID  . ipratropium  0.5 mg Nebulization Q4H  . latanoprost  1 drop Right Eye QHS  . levalbuterol  0.63 mg Nebulization Q4H  . levofloxacin (LEVAQUIN) IV  750 mg Intravenous Q48H  . levothyroxine  75 mcg Oral QAC breakfast  . loratadine  10 mg Oral Daily  . LORazepam  0.5 mg Oral QHS  . methylPREDNISolone (SOLU-MEDROL) injection  125 mg Intravenous 3 times per day  . pantoprazole  40 mg Oral Daily  . sodium chloride  500 mL Intravenous Once  . sodium chloride  10-40 mL Intracatheter Q12H   Continuous: . 0.9 % NaCl with KCl 20 mEq / Scott 50 mL/hr at 06/03/14 0454  . nitroGLYCERIN 5 mcg/min (06/03/14 0400)   CWU:[id number] **OR** acetaminophen, benzonatate, guaiFENesin-dextromethorphan, hydrALAZINE, ipratropium-albuterol, levalbuterol, LORazepam, menthol-cetylpyridinium, ondansetron **OR** ondansetron (ZOFRAN) IV, sodium chloride  Assesment: She has acute multifactorial respiratory failure. She has some COPD and looks like she has an exacerbation. She has community-acquired pneumonia. She seems to have some heart failure associated with her atrial fibrillation and rapid ventricular response. She remains  on BiPAP at this time. Principal Problem:   Acute respiratory failure Active Problems:   Hypertension   Hyperlipemia   Hypothyroid   CKD (chronic kidney disease)   Asthma, chronic   Hyponatremia   CAP (community acquired pneumonia)   Acute bronchitis   Hypoxia   Positive D dimer   New onset a-fib   Cardiomyopathy   Acute exacerbation of CHF (congestive heart failure); Probable   COPD exacerbation    Plan: Trial off BiPAP again. No other changes in treatments.    LOS: 7 days   Daisy Scott 06/03/2014, 7:55 AM

## 2014-06-03 NOTE — Progress Notes (Signed)
PHARMACIST - PHYSICIAN COMMUNICATION DR:   Memon CONCERNING: Antibiotic IV to Oral Route Change Policy  RECOMMENDATION: This patient is receiving Levaquin by the intravenous route.  Based on criteria approved by the Pharmacy and Therapeutics Committee, the antibiotic(s) is/are being converted to the equivalent oral dose form(s).   DESCRIPTION: These criteria include:  Patient being treated for a respiratory tract infection, urinary tract infection, cellulitis or clostridium difficile associated diarrhea if on metronidazole  The patient is not neutropenic and does not exhibit a GI malabsorption state  The patient is eating (either orally or via tube) and/or has been taking other orally administered medications for a least 24 hours  The patient is improving clinically and has a Tmax < 100.5  If you have questions about this conversion, please contact the Pharmacy Department  [x]  ( [phone]   []  ( [phone]  [place]  []  ( [phone] )  [facility] []  ( [phone] )  [facility]    Roxy Mastandrea, PharmD 

## 2014-06-03 NOTE — Progress Notes (Signed)
[facility] PROGRESS NOTE  Daisy Scott ZOX:[id number] DOB: February 09, 1919 DOA: 05/27/2014 PCP: Rudi Heap, MD  Assessment/Plan: #1 acute respiratory failure Likely multifactorial secondary to worsening acute COPD exacerbation and community-acquired pneumonia versus bronchitis in the setting of A. fib with RVR and probable acute systolic CHF exacerbation. Patient did have an elevated d-dimer and concern for pulmonary emboli. Per daughter patient with prior history of pulmonary emboli and DVT several years ago. VQ scan negative for PE. Patient was placed on low-dose diuretics per cardiology for possible cardiac asthma. Lopressor has been changed to Cardizem. Rate control. Patient currently on aspirin. Patient with good urine output after being diuresed with intravenous Lasix.   Patient with improving work of breathing. Continue current treatments.  Monitor closely.   #2 A. fib with RVR Patient with left bundle branch block by EKG with prior evidence of IVCD. A. fib with RVR could be secondary to acute respiratory tract infection. Patient's chads score is 5. Patient's daughter declines pursuing anticoagulation at this time. TSH is normal. 2-D echo with EF of 25-30%, with diffuse hypokinesis and septal dyssynergy. Lopressor has been changed to Cardizem per cardiology secondary to wheezing. Continue aspirin. Keep magnesium greater than 2. Cardiology following and appreciate input and recommendations.  #3 probable early community-acquired pneumonia versus bronchitis Chest x-ray yesterday with questionable densities in the right hand, right lower lung region. Foci of airspace disease or infection cannot be excluded. Continue IV antibiotics. Continue nebulizer treatments. Continue Mucinex. Continue oxygen and Claritin.   #4 acute COPD exacerbation Patient continues to wheeze today. She is continued on intravenous steroids, bronchodilators and antibiotics. She has been wearing the BiPAP overnight  but has been given a trial without BiPAP during the day. Will likely need to resume BiPAP at night. Appreciate pulmonology assistance. Progress is slow.  #5 probable acute systolic CHF exacerbation/probable ischemic cardiomyopathy Ejection fraction was noted to be 25-30% on echocardiogram. She was noted to be volume overload and has been diuresed with intravenous Lasix. Still appears have some mild volume overload. Continue with Lasix. She was started on a nitroglycerin infusion since it was felt that she may have an ischemic component. This has been discontinued since he no longer has chest pain and her blood pressure is on the lower side. Cardiology following and appreciate input and recommendations.  #6 chronic kidney disease stage II Stable. Follow.  #7 hypothyroidism TSH within normal limits. Continue home dose Synthroid.  #8 hyponatremia Hyponatremia questionable etiology. Likely secondary to hypervolemic hyponatremia. Improving and currently at 130.  Urine sodium was less than 10. Urine osmolality is low. Repeat chest x-ray with worsening infiltrates. Patient's sodium dropped was as low as 115 currently at 130. Patient diuresed 4.6L yesterday. Patient has been seen by nephrology and patient was started on fluid restriction, IV Lasix and intravenous fluids. As her sodium has near normalized, IV fluids have been discontinued. She is continued on Lasix. Nephrology following and appreciate input and recommendations.   #9 hypertension Stable. On Cardizem.  #10 positive d-dimer Patient with hypoxia and shortness of breath. Patient with prior history of PE and DVT per daughter. VQ scan low probability for PE.  #11 hypokalemia Secondary to diureses. Replete.  #12 prophylaxis Lovenox for DVT prophylaxis.    Code Status: Full Family Communication: Updated patient, no family at bedside. Disposition Plan: Remain in stepdown unit.   Consultants:  Cardiology: Dr Diona Browner  05/28/14  Nephrology: Dr. Fausto Skillern 2/12/ 2016  Pulmonary: Dr Juanetta Gosling 06/02/14  Procedures:  2 d echo 05/28/14  Chest x-ray 05/27/2014    lower extremity Dopplers 05/28/2014 negative  VQ scan 05/29/2014  Antibiotics:  IV ROCEPHIN 05/27/2014>>>> 05/29/2014  IV AZITHROMYCIN 05/27/2014 >>>> 05/29/2014  Oral Augmentin 05/30/2014>>>> 05/31/2014  IV Levaquin 05/31/2014  HPI/Subjective: Feels that breathing is slowly improving. Continues to cough.  Objective: Filed Vitals:   06/03/14 1800  BP: 120/62  Pulse: 74  Temp:   Resp: 16    Intake/Output Summary (Last 24 hours) at 06/03/14 1813 Last data filed at 06/03/14 1534  Gross per 24 hour  Intake    635 ml  Output   3450 ml  Net  -2815 ml   Filed Weights   06/01/14 0808 06/02/14 0500 06/03/14 0500  Weight: 83.1 kg (183 lb 3.2 oz) 85.3 kg (188 lb 0.8 oz) 83.1 kg (183 lb 3.2 oz)    Exam:   General:  NAD, awake, off bipap  Cardiovascular: Irregularly irregular. Trace bilateral lower extremity edema.   Respiratory: diminished breath sounds with wheezing bilaterally and coarse crackles at bases  Abdomen: Soft, nontender, nondistended, positive bowel sounds.  Musculoskeletal: No clubbing cyanosis. Trace bilateral lower extremity edema.  Data Reviewed: Basic Metabolic Panel:  Recent Labs Lab 05/29/14 0802  05/30/14 0522  05/31/14 0518  06/01/14 0326 06/01/14 4098 06/01/14 1436 06/01/14 2146 06/02/14 0304 06/02/14 1453 06/03/14 0440  NA 122*  < > 120*  < > 118*  < > 119* 124* 124* 128* 127* 128* 130*  K 3.9  --  4.0  < > 4.3  < > 4.1 4.6 3.5 3.2* 3.0* 4.0 4.9  CL 89*  --  85*  < > 81*  < > 91* 92* 90* 90* 95* 92* 101  CO2 24  --  24  < > 23  < > GLUCOSE 132*  --  121*  < > 132*  < > 108* 128* 117* 198* 153* 106* 133*  BUN 32*  --  39*  < > 42*  < > 39* 39* 39* 37* 36* 38* 28*  CREATININE 1.48*  --  1.49*  < > 1.55*  < > 1.47* 1.44* 1.40* 1.42* 1.33* 1.29* 1.05  CALCIUM 8.2*  --  8.3*   < > 8.4  < > 7.5* 7.9* 7.7* 7.9* 6.9* 7.7* 6.7*  MG 1.5  --  2.0  --  1.9  --  1.8 1.8  --   --   --   --   --   < > = values in this interval not displayed. Liver Function Tests: No results for input(s): AST, ALT, ALKPHOS, BILITOT, PROT, ALBUMIN in the last 168 hours. No results for input(s): LIPASE, AMYLASE in the last 168 hours. No results for input(s): AMMONIA in the last 168 hours. CBC:  Recent Labs Lab 05/29/14 0802 05/30/14 0522 05/31/14 0518 06/01/14 0326 06/02/14 0445 06/03/14 0440  WBC 12.6* 9.4 9.2 7.0 5.3 7.6  NEUTROABS 11.2* 8.5*  --  6.1  --   --   HGB 12.6 12.1 12.3 12.3 8.9* 10.2*  HCT 38.2 36.1 36.4 35.7* 25.5* 29.3*  MCV 93.9 91.2 89.9 91.3 89.5 90.4  PLT 232 222 290 203 189 228   Cardiac Enzymes:  Recent Labs Lab 05/28/14 0635 05/28/14 1924 05/29/14 0100  TROPONINI 0.04* 0.05* 0.07*   BNP (last 3 results)  Recent Labs  05/31/14 0518 06/01/14 1436 06/02/14 0445  BNP 939.0* 1401.0* 816.0*    ProBNP (last 3 results) No results for input(s): PROBNP in the  last 8760 hours.  CBG: No results for input(s): GLUCAP in the last 168 hours.  Recent Results (from the past 240 hour(s))  Culture, expectorated sputum-assessment     Status: None   Collection Time: 05/27/14  8:49 PM  Result Value Ref Range Status   Specimen Description SPUTUM  Final   Special Requests NONE  Final   Sputum evaluation   Final    THIS SPECIMEN IS ACCEPTABLE. RESPIRATORY CULTURE REPORT TO FOLLOW. PERFORMED AT [facility]    Report Status 05/27/2014 FINAL  Final  Culture, respiratory (NON-Expectorated)     Status: None   Collection Time: 05/27/14  8:49 PM  Result Value Ref Range Status   Specimen Description SPUTUM EXPECTORATED  Final   Special Requests NONE  Final   Gram Stain   Final    FEW WBC PRESENT,BOTH PMN AND MONONUCLEAR FEW SQUAMOUS EPITHELIAL CELLS PRESENT FEW GRAM POSITIVE COCCI IN PAIRS RARE GRAM NEGATIVE RODS Performed at [facility]    Culture   Final     NORMAL OROPHARYNGEAL FLORA Performed at [facility]    Report Status 05/30/2014 FINAL  Final  Culture, Urine     Status: None   Collection Time: 05/27/14  8:50 PM  Result Value Ref Range Status   Specimen Description URINE, CLEAN CATCH  Final   Special Requests NONE  Final   Colony Count NO GROWTH Performed at [facility]   Final   Culture NO GROWTH Performed at [facility]   Final   Report Status 05/29/2014 FINAL  Final  Rapid strep screen     Status: None   Collection Time: 05/28/14 12:25 PM  Result Value Ref Range Status   Streptococcus, Group A Screen (Direct) NEGATIVE NEGATIVE Final    Comment: (NOTE) A Rapid Antigen test may result negative if the antigen level in the sample is below the detection level of this test. The FDA has not cleared this test as a stand-alone test therefore the rapid antigen negative result has reflexed to a Group A Strep culture.   Culture, Group A Strep     Status: None   Collection Time: 05/28/14 12:25 PM  Result Value Ref Range Status   Specimen Description THROAT  Final   Special Requests NONE  Final   Culture   Final    No Beta Hemolytic Streptococci Isolated Performed at [facility]    Report Status 05/30/2014 FINAL  Final  Culture, Urine     Status: None   Collection Time: 05/29/14  5:46 PM  Result Value Ref Range Status   Specimen Description URINE, CLEAN CATCH  Final   Special Requests NONE  Final   Colony Count NO GROWTH Performed at [facility]   Final   Culture NO GROWTH Performed at [facility]   Final   Report Status 05/31/2014 FINAL  Final  MRSA PCR Screening     Status: None   Collection Time: 05/31/14 10:04 AM  Result Value Ref Range Status   MRSA by PCR NEGATIVE NEGATIVE Final    Comment:        The GeneXpert MRSA Assay (FDA approved for NASAL specimens only), is one component of a comprehensive MRSA colonization surveillance program. It is not intended  to diagnose MRSA infection nor to guide or monitor treatment for MRSA infections.      Studies: Dg Chest Port 1 View  06/03/2014   CLINICAL DATA:  Short of breath.  Followup abnormal lung  density.  EXAM: PORTABLE CHEST - 1 VIEW  COMPARISON:  06/01/2014 and previous  FINDINGS: Right arm PICC has its tip in the SVC at the azygos level. Artifact overlies the chest elsewhere. The heart is enlarged. The aorta is unfolded. Abnormal density persists in both lower lobes consistent with atelectasis or patchy pneumonia. No new finding.  IMPRESSION: Persistent abnormal density in both lower lobes consistent with atelectasis and or pneumonia. No change.   Electronically Signed   By: Paulina FusiMark  Shogry M.D.   On: 06/03/2014 07:21    Scheduled Meds: . antiseptic oral rinse  7 mL Mouth Rinse q12n4p  . aspirin EC  81 mg Oral Daily  . brinzolamide  1 drop Right Eye 2 times per day  . budesonide-formoterol  2 puff Inhalation BID  . chlorhexidine  15 mL Mouth Rinse BID  . diltiazem  240 mg Oral Daily  . [START ON 06/04/2014] enoxaparin (LOVENOX) injection  40 mg Subcutaneous Q24H  . furosemide  20 mg Intravenous Daily  . guaiFENesin  1,200 mg Oral BID  . ipratropium  0.5 mg Nebulization Q4H  . latanoprost  1 drop Right Eye QHS  . levalbuterol  0.63 mg Nebulization Q4H  . [START ON 06/04/2014] levofloxacin  750 mg Oral Q48H  . levothyroxine  75 mcg Oral QAC breakfast  . loratadine  10 mg Oral Daily  . LORazepam  0.5 mg Oral QHS  . methylPREDNISolone (SOLU-MEDROL) injection  125 mg Intravenous 3 times per day  . pantoprazole  40 mg Oral Daily  . sodium chloride  500 mL Intravenous Once  . sodium chloride  10-40 mL Intracatheter Q12H   Continuous Infusions:    Principal Problem:   Acute respiratory failure Active Problems:   Hypertension   Hyperlipemia   Hypothyroid   CKD (chronic kidney disease)   Asthma, chronic   Hyponatremia   CAP (community acquired pneumonia)   Acute bronchitis   Hypoxia    Positive D dimer   New onset a-fib   Cardiomyopathy   Acute exacerbation of CHF (congestive heart failure); Probable   COPD exacerbation    Time spent: 30 mins    MEMON,JEHANZEB MD [facility] Pager [phone]. If 7PM-7AM, please contact night-coverage at [email or web address], password [email or web address] 06/03/2014, 6:13 PM  LOS: 7 days

## 2014-06-04 DIAGNOSIS — E43 Unspecified severe protein-calorie malnutrition: Secondary | ICD-10-CM | POA: Insufficient documentation

## 2014-06-04 LAB — BLOOD GAS, ARTERIAL
ACID-BASE EXCESS: 6.3 mmol/L — AB (ref 0.0–2.0)
BICARBONATE: 30 meq/L — AB (ref 20.0–24.0)
Delivery systems: POSITIVE
Drawn by: 25788
Expiratory PAP: 7
FIO2: 30 %
Inspiratory PAP: 14
MODE: POSITIVE
O2 SAT: 96 %
PATIENT TEMPERATURE: 37
TCO2: 26.3 mmol/L (ref 0–100)
pCO2 arterial: 41 mmHg (ref 35.0–45.0)
pH, Arterial: 7.478 — ABNORMAL HIGH (ref 7.350–7.450)
pO2, Arterial: 79.8 mmHg — ABNORMAL LOW (ref 80.0–100.0)

## 2014-06-04 LAB — BASIC METABOLIC PANEL
Anion gap: 6 (ref 5–15)
BUN: 29 mg/dL — ABNORMAL HIGH (ref 6–23)
CALCIUM: 8.1 mg/dL — AB (ref 8.4–10.5)
CO2: 30 mmol/L (ref 19–32)
CREATININE: 1.14 mg/dL — AB (ref 0.50–1.10)
Chloride: 98 mmol/L (ref 96–112)
GFR calc Af Amer: 47 mL/min — ABNORMAL LOW (ref >90)
GFR, EST NON AFRICAN AMERICAN: 40 mL/min — AB (ref >90)
GLUCOSE: 122 mg/dL — AB (ref 70–99)
Potassium: 3.5 mmol/L (ref 3.5–5.1)
SODIUM: 134 mmol/L — AB (ref 135–145)

## 2014-06-04 LAB — CBC
HEMATOCRIT: 36.3 % (ref 36.0–46.0)
Hemoglobin: 12.2 g/dL (ref 12.0–15.0)
MCH: 30.7 pg (ref 26.0–34.0)
MCHC: 33.6 g/dL (ref 30.0–36.0)
MCV: 91.4 fL (ref 78.0–100.0)
Platelets: 299 10*3/uL (ref 150–400)
RBC: 3.97 MIL/uL (ref 3.87–5.11)
RDW: 15.1 % (ref 11.5–15.5)
WBC: 8 10*3/uL (ref 4.0–10.5)

## 2014-06-04 MED ORDER — BOOST / RESOURCE BREEZE PO LIQD
1.0000 | Freq: Three times a day (TID) | ORAL | Status: DC
  Administered 2014-06-04 – 2014-06-05 (×2): 1 via ORAL

## 2014-06-04 MED ORDER — IPRATROPIUM BROMIDE 0.02 % IN SOLN
0.5000 mg | Freq: Four times a day (QID) | RESPIRATORY_TRACT | Status: DC
  Administered 2014-06-04 – 2014-06-08 (×16): 0.5 mg via RESPIRATORY_TRACT
  Filled 2014-06-04 (×15): qty 2.5

## 2014-06-04 MED ORDER — LEVALBUTEROL HCL 0.63 MG/3ML IN NEBU
0.6300 mg | INHALATION_SOLUTION | Freq: Four times a day (QID) | RESPIRATORY_TRACT | Status: DC
  Administered 2014-06-04 – 2014-06-08 (×15): 0.63 mg via RESPIRATORY_TRACT
  Filled 2014-06-04 (×16): qty 3

## 2014-06-04 NOTE — Progress Notes (Signed)
Patient ID: Daisy Scott, female   DOB: Sep 15, 1918, 81 y.o.   MRN: [id number]      Seen for followup: Atrial fibrillation, cardiomyopathy  Subjective:    Lethargic  Off bipap  Using accessory muscles   Objective:   Temp:  [97.1 F (36.2 C)-98.2 F (36.8 C)] 97.4 F (36.3 C) (02/16 0800) Pulse Rate:  [45-124] 68 (02/16 0600) Resp:  [12-24] 15 (02/16 0400) BP: (86-137)/(39-100) 100/66 mmHg (02/16 0600) SpO2:  [89 %-100 %] 96 % (02/16 0705) FiO2 (%):  [30 %] 30 % (02/15 2335) Weight:  [80.5 kg (177 lb 7.5 oz)] 80.5 kg (177 lb 7.5 oz) (02/16 0500) Last BM Date: 05/28/14  Filed Weights   06/02/14 0500 06/03/14 0500 06/04/14 0500  Weight: 85.3 kg (188 lb 0.8 oz) 83.1 kg (183 lb 3.2 oz) 80.5 kg (177 lb 7.5 oz)    Intake/Output Summary (Last 24 hours) at 06/04/14 0857 Last data filed at 06/04/14 0500  Gross per 24 hour  Intake    120 ml  Output   2550 ml  Net  -2430 ml    Telemetry: SR rate 80's  Frequent PVC;s   Exam:  General: Elderly woman, comfortable but using accessory muscles   Lungs: Prolonged expiratory phase with active wheezing and scattered rhonchi.  Cardiac: Indistinct PMI, irregularly regular with ectopy, no gallop.  Abdomen: Soft, decreased bowel sounds, nontender.  Extremities: Trace leg edema.  PIC line RUE    Lab Results:  Basic Metabolic Panel:  Recent Labs Lab 05/31/14 0518  06/01/14 0326 06/01/14 0921  06/02/14 0304 06/02/14 1453 06/03/14 0440  NA 118*  < > 119* 124*  < > 127* 128* 130*  K 4.3  < > 4.1 4.6  < > 3.0* 4.0 4.9  CL 81*  < > 91* 92*  < > 95* 92* 101  CO2 23  < > 19 21  < > 26 29 26   GLUCOSE 132*  < > 108* 128*  < > 153* 106* 133*  BUN 42*  < > 39* 39*  < > 36* 38* 28*  CREATININE 1.55*  < > 1.47* 1.44*  < > 1.33* 1.29* 1.05  CALCIUM 8.4  < > 7.5* 7.9*  < > 6.9* 7.7* 6.7*  MG 1.9  --  1.8 1.8  --   --   --   --   < > = values in this interval not displayed.  CBC:  Recent Labs Lab 06/01/14 0326 06/02/14 0445  06/03/14 0440  WBC 7.0 5.3 7.6  HGB 12.3 8.9* 10.2*  HCT 35.7* 25.5* 29.3*  MCV 91.3 89.5 90.4  PLT 203 189 228    Cardiac Enzymes:  Recent Labs Lab 05/28/14 1924 05/29/14 0100  TROPONINI 0.05* 0.07*    Imaging: 2/15  IMPRESSION: Persistent abnormal density in both lower lobes consistent with atelectasis and or pneumonia. No change.   Medications:   Scheduled Medications: . antiseptic oral rinse  7 mL Mouth Rinse q12n4p  . aspirin EC  81 mg Oral Daily  . brinzolamide  1 drop Right Eye 2 times per day  . budesonide-formoterol  2 puff Inhalation BID  . chlorhexidine  15 mL Mouth Rinse BID  . diltiazem  240 mg Oral Daily  . enoxaparin (LOVENOX) injection  40 mg Subcutaneous Q24H  . furosemide  20 mg Intravenous Daily  . guaiFENesin  1,200 mg Oral BID  . ipratropium  0.5 mg Nebulization Q4H  . latanoprost  1 drop Right Eye QHS  .  levalbuterol  0.63 mg Nebulization Q4H  . levofloxacin  750 mg Oral Q48H  . levothyroxine  75 mcg Oral QAC breakfast  . loratadine  10 mg Oral Daily  . LORazepam  0.5 mg Oral QHS  . methylPREDNISolone (SOLU-MEDROL) injection  125 mg Intravenous 3 times per day  . pantoprazole  40 mg Oral Daily  . sodium chloride  500 mL Intravenous Once  . sodium chloride  10-40 mL Intracatheter Q12H    Infusions:    PRN Medications: acetaminophen **OR** acetaminophen, benzonatate, guaiFENesin-dextromethorphan, hydrALAZINE, ipratropium-albuterol, levalbuterol, LORazepam, menthol-cetylpyridinium, ondansetron **OR** ondansetron (ZOFRAN) IV, sodium chloride   Assessment:   1. Atrial fibrillation,  maint NSR continue asa and cardizem   2. Cardiomyopathy, LVEF 25-30% with diffuse hypokinesis and near akinesis in the mid to apical anteroseptal and mid to basal inferolateral wall. We initially started beta blocker instead of calcium channel blocker, however wheezing was worse. On lasix iv daily and sodium ok for now  Weight down 5 kg and good diuresis with  just 20 mg lasix daily   3. Hyponatremia,  Up to 130  2/15  improved   4. Bronchitis and asthma exacerbation, inhalers steroids and bipap  Per primary service   5. DNR status.   Plan/Discussion:    IV nitro stopped yesterday BP better continue to keep on dry side  Chronic respitory failure with pneumonia and poor mechanics of breathing Palliative Care  DNR

## 2014-06-04 NOTE — Progress Notes (Signed)
Patient lying in bed resting quielty at this time. Bipap in place. Oral care performed. Patient tolerated well.

## 2014-06-04 NOTE — Progress Notes (Addendum)
[facility] PROGRESS NOTE  Daisy Scott UEA:[id number] DOB: Sep 07, 1918 DOA: 05/27/2014 PCP: Rudi Heap, MD  Summary:  This patient was initially admitted to the hospital with cough, congestion and shortness of breath. She was on the wheezing. She was started on bronchodilators, intravenous steroids and intravenous antibiotics. Her hospital course has been complex. She initially developed rapid atrial fibrillation which is felt to be reactive to her underlying pulmonary issues. She was seen by cardiology and placed on rate control. Her daughter did not wish the patient to be anticoagulated. As her course progressed, it was felt that her respiratory failure was likely multifactorial including COPD exacerbation, acute systolic congestive heart failure since her ejection fraction was noted to be 25-30%, as well as possible community acquired pneumonia. Patient was treated appropriately with steroids, antibiotics and diuretics. Despite these measures, her respiratory status declined and she required transfer to the ICU for closer observation. She was started on BiPAP therapy with some improvement in respiratory status. Patient has significantly diuresed with intravenous Lasix. She is being followed by both cardiology and pulmonology. She's been weaned off the BiPAP and has been doing fairly well. She does still require BiPAP at night. Her course was further complicated by hyponatremia which she was seen by the nephrology service. With fluid restriction, IV fluids and IV Lasix, her hyponatremia has resolved. Main issues at this point would be her respiratory issues including COPD as well as CHF.  Assessment/Plan: #1 acute respiratory failure Likely multifactorial secondary to worsening acute COPD exacerbation and community-acquired pneumonia versus bronchitis in the setting of A. fib with RVR and probable acute systolic CHF exacerbation. Patient did have an elevated d-dimer and concern for pulmonary  emboli. Per daughter patient with prior history of pulmonary emboli and DVT several years ago. VQ scan negative for PE. Patient was placed on low-dose diuretics per cardiology for possible cardiac asthma. Lopressor has been changed to Cardizem for rate control. Patient with good urine output after being diuresed with intravenous Lasix.   Patient with improving work of breathing. Continue current treatments.  Monitor closely.   #2 A. fib with RVR Patient with left bundle branch block by EKG with prior evidence of IVCD. A. fib with RVR could be secondary to acute respiratory tract infection. Patient's chads score is 5. Patient's daughter declines pursuing anticoagulation at this time. TSH is normal. 2-D echo with EF of 25-30%, with diffuse hypokinesis and septal dyssynergy. Lopressor has been changed to Cardizem per cardiology secondary to wheezing. Continue aspirin. Keep magnesium greater than 2. Cardiology following and appreciate input and recommendations.  #3 probable early community-acquired pneumonia versus bronchitis Chest x-ray with questionable densities on the right side. Foci of airspace disease or infection cannot be excluded. Continue IV antibiotics. Continue nebulizer treatments. Continue Mucinex. Continue oxygen and Claritin. Encouraged incentive spirometry  #4 acute COPD exacerbation She is continued on intravenous steroids, bronchodilators and antibiotics. She has been wearing the BiPAP overnight and intermittently during the day as needed. Her wheezing appears to be slowly improving. Appreciate pulmonology assistance. Progress is slow. Continue current treatments.  #5 probable acute systolic CHF exacerbation/probable ischemic cardiomyopathy Ejection fraction was noted to be 25-30% on echocardiogram. She was noted to be volume overload and has been diuresed with intravenous Lasix. Still appears have some mild volume overload. Continue with Lasix. She was started on a nitroglycerin  infusion since it was felt that she may have an ischemic component. This was discontinued yesterday since she no longer had chest pain and her  blood pressure was on the lower side. Cardiology following and appreciate input and recommendations.  #6 chronic kidney disease stage II Stable. Follow.  #7 hypothyroidism TSH within normal limits. Continue home dose Synthroid.  #8 hyponatremia Hyponatremia questionable etiology. Likely secondary to hypervolemic hyponatremia. Urine sodium was less than 10. Urine osmolality is low. Repeat chest x-ray with persistent bibasilar infiltrates. Patient's sodium had dropped and was as low as 115. Patient has been seen by nephrology and patient was started on fluid restriction, IV Lasix and intravenous fluids. As her sodium has near normalized, IV fluids have been discontinued. She is continued on Lasix. Nephrology following and appreciate input and recommendations.   #9 hypertension Stable. On Cardizem.  #10 positive d-dimer Patient with hypoxia and shortness of breath. Patient with prior history of PE and DVT per daughter. VQ scan low probability for PE.  #11 hypokalemia Secondary to diureses. Replete.  #12 prophylaxis Lovenox for DVT prophylaxis.    Code Status: Full Family Communication: Updated patient, no family at bedside. Disposition Plan: Remain in stepdown unit.   Consultants:  Cardiology: Dr Diona Browner 05/28/14  Nephrology: Dr. Fausto Skillern 2/12/ 2016  Pulmonary: Dr Juanetta Gosling 06/02/14  Procedures:  2 d echo 05/28/14  Chest x-ray 05/27/2014    lower extremity Dopplers 05/28/2014 negative  VQ scan 05/29/2014  Antibiotics:  IV ROCEPHIN 05/27/2014>>>> 05/29/2014  IV AZITHROMYCIN 05/27/2014 >>>> 05/29/2014  Oral Augmentin 05/30/2014>>>> 05/31/2014  IV Levaquin 05/31/2014  HPI/Subjective: Feels sleepy this morning, just woke up on my arrival. Feels breathing is slowly improving.  Objective: Filed Vitals:   06/04/14 0800  BP:    Pulse:   Temp: 97.4 F (36.3 C)  Resp:     Intake/Output Summary (Last 24 hours) at 06/04/14 0912 Last data filed at 06/04/14 0500  Gross per 24 hour  Intake    120 ml  Output   2550 ml  Net  -2430 ml   Filed Weights   06/02/14 0500 06/03/14 0500 06/04/14 0500  Weight: 85.3 kg (188 lb 0.8 oz) 83.1 kg (183 lb 3.2 oz) 80.5 kg (177 lb 7.5 oz)    Exam:   General:  NAD, sleepy this am, off bipap  Cardiovascular: Irregularly irregular. 1+ bilateral lower extremity edema.   Respiratory: coarse crackles at bases, wheezing improving.  Abdomen: Soft, nontender, nondistended, positive bowel sounds.  Musculoskeletal: No clubbing cyanosis. 1+ bilateral lower extremity edema.  Data Reviewed: Basic Metabolic Panel:  Recent Labs Lab 05/29/14 0802  05/30/14 0522  05/31/14 0518  06/01/14 0326 06/01/14 1610 06/01/14 1436 06/01/14 2146 06/02/14 0304 06/02/14 1453 06/03/14 0440  NA 122*  < > 120*  < > 118*  < > 119* 124* 124* 128* 127* 128* 130*  K 3.9  --  4.0  < > 4.3  < > 4.1 4.6 3.5 3.2* 3.0* 4.0 4.9  CL 89*  --  85*  < > 81*  < > 91* 92* 90* 90* 95* 92* 101  CO2 24  --  24  < > 23  < > GLUCOSE 132*  --  121*  < > 132*  < > 108* 128* 117* 198* 153* 106* 133*  BUN 32*  --  39*  < > 42*  < > 39* 39* 39* 37* 36* 38* 28*  CREATININE 1.48*  --  1.49*  < > 1.55*  < > 1.47* 1.44* 1.40* 1.42* 1.33* 1.29* 1.05  CALCIUM 8.2*  --  8.3*  < > 8.4  < >  7.5* 7.9* 7.7* 7.9* 6.9* 7.7* 6.7*  MG 1.5  --  2.0  --  1.9  --  1.8 1.8  --   --   --   --   --   < > = values in this interval not displayed. Liver Function Tests: No results for input(s): AST, ALT, ALKPHOS, BILITOT, PROT, ALBUMIN in the last 168 hours. No results for input(s): LIPASE, AMYLASE in the last 168 hours. No results for input(s): AMMONIA in the last 168 hours. CBC:  Recent Labs Lab 05/29/14 0802 05/30/14 0522 05/31/14 0518 06/01/14 0326 06/02/14 0445 06/03/14 0440  WBC 12.6* 9.4 9.2 7.0 5.3  7.6  NEUTROABS 11.2* 8.5*  --  6.1  --   --   HGB 12.6 12.1 12.3 12.3 8.9* 10.2*  HCT 38.2 36.1 36.4 35.7* 25.5* 29.3*  MCV 93.9 91.2 89.9 91.3 89.5 90.4  PLT 232 222 290 203 189 228   Cardiac Enzymes:  Recent Labs Lab 05/28/14 1924 05/29/14 0100  TROPONINI 0.05* 0.07*   BNP (last 3 results)  Recent Labs  05/31/14 0518 06/01/14 1436 06/02/14 0445  BNP 939.0* 1401.0* 816.0*    ProBNP (last 3 results) No results for input(s): PROBNP in the last 8760 hours.  CBG: No results for input(s): GLUCAP in the last 168 hours.  Recent Results (from the past 240 hour(s))  Culture, expectorated sputum-assessment     Status: None   Collection Time: 05/27/14  8:49 PM  Result Value Ref Range Status   Specimen Description SPUTUM  Final   Special Requests NONE  Final   Sputum evaluation   Final    THIS SPECIMEN IS ACCEPTABLE. RESPIRATORY CULTURE REPORT TO FOLLOW. PERFORMED AT [facility]    Report Status 05/27/2014 FINAL  Final  Culture, respiratory (NON-Expectorated)     Status: None   Collection Time: 05/27/14  8:49 PM  Result Value Ref Range Status   Specimen Description SPUTUM EXPECTORATED  Final   Special Requests NONE  Final   Gram Stain   Final    FEW WBC PRESENT,BOTH PMN AND MONONUCLEAR FEW SQUAMOUS EPITHELIAL CELLS PRESENT FEW GRAM POSITIVE COCCI IN PAIRS RARE GRAM NEGATIVE RODS Performed at [facility]    Culture   Final    NORMAL OROPHARYNGEAL FLORA Performed at [facility]    Report Status 05/30/2014 FINAL  Final  Culture, Urine     Status: None   Collection Time: 05/27/14  8:50 PM  Result Value Ref Range Status   Specimen Description URINE, CLEAN CATCH  Final   Special Requests NONE  Final   Colony Count NO GROWTH Performed at [facility]   Final   Culture NO GROWTH Performed at [facility]   Final   Report Status 05/29/2014 FINAL  Final  Rapid strep screen     Status: None   Collection Time: 05/28/14 12:25 PM  Result Value  Ref Range Status   Streptococcus, Group A Screen (Direct) NEGATIVE NEGATIVE Final    Comment: (NOTE) A Rapid Antigen test may result negative if the antigen level in the sample is below the detection level of this test. The FDA has not cleared this test as a stand-alone test therefore the rapid antigen negative result has reflexed to a Group A Strep culture.   Culture, Group A Strep     Status: None   Collection Time: 05/28/14 12:25 PM  Result Value Ref Range Status   Specimen Description THROAT  Final   Special Requests  NONE  Final   Culture   Final    No Beta Hemolytic Streptococci Isolated [facility]formed at [facility]    Report Status 05/30/2014 FINAL  Final  Culture, Urine     Status: None   Collection Time: 05/29/14  5:46 PM  Result Value Ref Range Status   Specimen Description URINE, CLEAN CATCH  Final   Special Requests NONE  Final   Colony Count NO GROWTH [facility] at [facility]   Final   Culture NO GROWTH [facility]formed at [facility]   Final   Report Status 05/31/2014 FINAL  Final  MRSA PCR Screening     Status: None   Collection Time: 05/31/14 10:04 AM  Result Value Ref Range Status   MRSA by PCR NEGATIVE NEGATIVE Final    Comment:        The GeneXpert MRSA Assay (FDA approved for NASAL specimens only), is one component of a comprehensive MRSA colonization surveillance program. It is not intended to diagnose MRSA infection nor to guide or monitor treatment for MRSA infections.      Studies: Dg Chest Port 1 View  06/03/2014   CLINICAL DATA:  Short of breath.  Followup abnormal lung density.  EXAM: PORTABLE CHEST - 1 VIEW  COMPARISON:  06/01/2014 and previous  FINDINGS: Right arm PICC has its tip in the SVC at the azygos level. Artifact overlies the chest elsewhere. The heart is enlarged. The aorta is unfolded. Abnormal density persists in both lower lobes consistent with atelectasis or patchy pneumonia. No new finding.  IMPRESSION:  Persistent abnormal density in both lower lobes consistent with atelectasis and or pneumonia. No change.   Electronically Signed   By: Paulina FusiMark  Shogry M.D.   On: 06/03/2014 07:21    Scheduled Meds: . antiseptic oral rinse  7 mL Mouth Rinse q12n4p  . aspirin EC  81 mg Oral Daily  . brinzolamide  1 drop Right Eye 2 times per day  . budesonide-formoterol  2 puff Inhalation BID  . chlorhexidine  15 mL Mouth Rinse BID  . diltiazem  240 mg Oral Daily  . enoxaparin (LOVENOX) injection  40 mg Subcutaneous Q24H  . furosemide  20 mg Intravenous Daily  . guaiFENesin  1,200 mg Oral BID  . ipratropium  0.5 mg Nebulization Q4H  . latanoprost  1 drop Right Eye QHS  . levalbuterol  0.63 mg Nebulization Q4H  . levofloxacin  750 mg Oral Q48H  . levothyroxine  75 mcg Oral QAC breakfast  . loratadine  10 mg Oral Daily  . LORazepam  0.5 mg Oral QHS  . methylPREDNISolone (SOLU-MEDROL) injection  125 mg Intravenous 3 times per day  . pantoprazole  40 mg Oral Daily  . sodium chloride  500 mL Intravenous Once  . sodium chloride  10-40 mL Intracatheter Q12H   Continuous Infusions:    Principal Problem:   Acute respiratory failure Active Problems:   Hypertension   Hyperlipemia   Hypothyroid   CKD (chronic kidney disease)   Asthma, chronic   Hyponatremia   CAP (community acquired pneumonia)   Acute bronchitis   Hypoxia   Positive D dimer   New onset a-fib   Cardiomyopathy   Acute exacerbation of CHF (congestive heart failure); Probable   COPD exacerbation    Time spent: 30 mins    Timarion Agcaoili MD [facility] Pager [phone]. If 7PM-7AM, please contact night-coverage at [email or web address], password [email or web address] 06/04/2014, 9:12 AM  LOS: 8 days

## 2014-06-04 NOTE — Progress Notes (Signed)
Daisy Scott  MRN: [id number]  DOB/AGE: [birth year] 79 y.o.  Primary Care Physician:MOORE, DONALD, MD  Admit date: 05/27/2014  Chief Complaint: No chief complaint on file.   S-Pt presented on  05/27/2014 with No chief complaint on file. .    Pt today feels better  Meds . antiseptic oral rinse  7 mL Mouth Rinse q12n4p  . aspirin EC  81 mg Oral Daily  . brinzolamide  1 drop Right Eye 2 times per day  . budesonide-formoterol  2 puff Inhalation BID  . chlorhexidine  15 mL Mouth Rinse BID  . diltiazem  240 mg Oral Daily  . enoxaparin (LOVENOX) injection  40 mg Subcutaneous Q24H  . feeding supplement (RESOURCE BREEZE)  1 Container Oral TID BM  . furosemide  20 mg Intravenous Daily  . guaiFENesin  1,200 mg Oral BID  . ipratropium  0.5 mg Nebulization Q6H  . latanoprost  1 drop Right Eye QHS  . levalbuterol  0.63 mg Nebulization Q6H  . levofloxacin  750 mg Oral Q48H  . levothyroxine  75 mcg Oral QAC breakfast  . loratadine  10 mg Oral Daily  . LORazepam  0.5 mg Oral QHS  . methylPREDNISolone (SOLU-MEDROL) injection  125 mg Intravenous 3 times per day  . pantoprazole  40 mg Oral Daily  . sodium chloride  500 mL Intravenous Once  . sodium chloride  10-40 mL Intracatheter Q12H      Physical Exam: Vital signs in last 24 hours: Temp:  [97.1 F (36.2 C)-98.2 F (36.8 C)] 97.4 F (36.3 C) (02/16 0800) Pulse Rate:  [55-103] 55 (02/16 0945) Resp:  [10-19] 10 (02/16 1105) BP: (86-133)/(38-84) 95/45 mmHg (02/16 1105) SpO2:  [10 %-100 %] 10 % (02/16 1354) FiO2 (%):  [30 %] 30 % (02/16 1354) Weight:  [177 lb 7.5 oz (80.5 kg)] 177 lb 7.5 oz (80.5 kg) (02/16 0500) Weight change: -5 lb 11.7 oz (-2.6 kg) Last BM Date: 05/28/14  Intake/Output from previous day: 02/15 0701 - 02/16 0700 In: 240 [P.O.:240] Out: 2550 [Urine:2550] Total I/O In: -  Out: 1500 [Urine:1500]   Physical Exam: General- pt is lethargic on bipap Resp- No acute REsp distress, rhonchi+ CVS- S1S2 irregular  in rate and rhythm GIT- BS+, soft, NT, ND EXT- Trace LE Edema,NO Cyanosis   Lab Results: CBC  Recent Labs  06/03/14 0440 06/04/14 0833  WBC 7.6 8.0  HGB 10.2* 12.2  HCT 29.3* 36.3  PLT 228 299    BMET  Recent Labs  06/03/14 0440 06/04/14 0833  NA 130* 134*  K 4.9 3.5  CL 101 98  CO2 26 30  GLUCOSE 133* 122*  BUN 28* 29*  CREATININE 1.05 1.14*  CALCIUM 6.7* 8.1*   Trend  Sodium 115=>124=>130=>134  Creat 1.67=>1.05-1.14     MICRO Recent Results (from the past 240 hour(s))  Culture, expectorated sputum-assessment     Status: None   Collection Time: 05/27/14  8:49 PM  Result Value Ref Range Status   Specimen Description SPUTUM  Final   Special Requests NONE  Final   Sputum evaluation   Final    THIS SPECIMEN IS ACCEPTABLE. RESPIRATORY CULTURE REPORT TO FOLLOW. PERFORMED AT [facility]    Report Status 05/27/2014 FINAL  Final  Culture, respiratory (NON-Expectorated)     Status: None   Collection Time: 05/27/14  8:49 PM  Result Value Ref Range Status   Specimen Description SPUTUM EXPECTORATED  Final   Special Requests NONE  Final   Gram Stain  Final    FEW WBC PRESENT,BOTH PMN AND MONONUCLEAR FEW SQUAMOUS EPITHELIAL CELLS PRESENT FEW GRAM POSITIVE COCCI IN PAIRS RARE GRAM NEGATIVE RODS [facility]formed [facility]    Culture   Final    NORMAL OROPHARYNGEAL FLORA Performed at [facility]    Report Status 05/30/2014 FINAL  Final  Culture, Urine     Status: None   Collection Time: 05/27/14  8:50 PM  Result Value Ref Range Status   Specimen Description URINE, CLEAN CATCH  Final   Special Requests NONE  Final   Colony Count NO GROWTH [facility]formed [facility]   Final   Culture NO GROWTH [facility]formed at [facility]   Final   Report Status 05/29/2014 FINAL  Final  Rapid strep screen     Status: None   Collection Time: 05/28/14 12:25 PM  Result Value Ref Range Status   Streptococcus, Group A Screen (Direct) NEGATIVE NEGATIVE  Final    Comment: (NOTE) A Rapid Antigen test may result negative if the antigen level in the sample is below the detection level of this test. The FDA has not cleared this test as a stand-alone test therefore the rapid antigen negative result has reflexed to a Group A Strep culture.   Culture, Group A Strep     Status: None   Collection Time: 05/28/14 12:25 PM  Result Value Ref Range Status   Specimen Description THROAT  Final   Special Requests NONE  Final   Culture   Final    No Beta Hemolytic Streptococci Isolated Performed at [facility]    Report Status 05/30/2014 FINAL  Final  Culture, Urine     Status: None   Collection Time: 05/29/14  5:46 PM  Result Value Ref Range Status   Specimen Description URINE, CLEAN CATCH  Final   Special Requests NONE  Final   Colony Count NO GROWTH [facility]formed at [facility]   Final   Culture NO GROWTH [facility]formed at [facility]   Final   Report Status 05/31/2014 FINAL  Final  MRSA PCR Screening     Status: None   Collection Time: 05/31/14 10:04 AM  Result Value Ref Range Status   MRSA by PCR NEGATIVE NEGATIVE Final    Comment:        The GeneXpert MRSA Assay (FDA approved for NASAL specimens only), is one component of a comprehensive MRSA colonization surveillance program. It is not intended to diagnose MRSA infection nor to guide or monitor treatment for MRSA infections.       Lab Results  Component Value Date   CALCIUM 8.1* 06/04/2014         Impression: 1)Renal  AKI secondary to Prerenal/ATN               AKI now better               CKD stage 3.               CKD since 2009               CKD secondary to HTN/Age associated decline                Progression of CKD slow          2)HTN  Medication- On Diuretics. On Calcium Channel Blockers On Vasodilators  3)Anemia HGb at goal (9--11)   4)CKD Mineral-Bone Disorder Calcium low Will check albumin Will start po Calcium  carbonate  5)Hyponatremia- Hypervolemic Hyponatremia    Improving  6)Hypokalemia   Sec to Diuretics   Improving  7)Acid base Co2 at goal     Plan:  Will check albumin Will start PO Cal Carbonate if still low after Correcting for albumin      Kassidy Dockendorf S 06/04/2014, 2:32 PM

## 2014-06-04 NOTE — Progress Notes (Signed)
Came into patient's room patient was complaining about wanting to come off bipap, feels like it's making her so dry. Took patient off and placed on 4LNC. Water given and nebulizer treatment given. Patient tolerating fine at this time

## 2014-06-04 NOTE — Progress Notes (Signed)
Subjective: She says she feels better. She has less shortness of breath. She's been off BiPAP more  Objective: Vital signs in last 24 hours: Temp:  [97.1 F (36.2 C)-98.2 F (36.8 C)] 97.4 F (36.3 C) (02/16 0800) Pulse Rate:  [45-124] 68 (02/16 0600) Resp:  [12-24] 15 (02/16 0400) BP: (86-137)/(39-100) 100/66 mmHg (02/16 0600) SpO2:  [89 %-100 %] 96 % (02/16 0705) FiO2 (%):  [30 %] 30 % (02/15 2335) Weight:  [80.5 kg (177 lb 7.5 oz)] 80.5 kg (177 lb 7.5 oz) (02/16 0500) Weight change: -2.6 kg (-5 lb 11.7 oz) Last BM Date: 05/28/14  Intake/Output from previous day: 02/15 0701 - 02/16 0700 In: 240 [P.O.:240] Out: 2550 [Urine:2550]  PHYSICAL EXAM General appearance: alert, cooperative and no distress Resp: clear to auscultation bilaterally Cardio: regular rate and rhythm, S1, S2 normal, no murmur, click, rub or gallop GI: soft, non-tender; bowel sounds normal; no masses,  no organomegaly Extremities: extremities normal, atraumatic, no cyanosis or edema  Lab Results:  Results for orders placed or performed during the hospital encounter of 05/27/14 (from the past 48 hour(s))  Basic metabolic panel     Status: Abnormal   Collection Time: 06/02/14  2:53 PM  Result Value Ref Range   Sodium 128 (L) 135 - 145 mmol/L   Potassium 4.0 3.5 - 5.1 mmol/L    Comment: DELTA CHECK NOTED   Chloride 92 (L) 96 - 112 mmol/L   CO2 29 19 - 32 mmol/L   Glucose, Bld 106 (H) 70 - 99 mg/dL   BUN 38 (H) 6 - 23 mg/dL   Creatinine, Ser 1.29 (H) 0.50 - 1.10 mg/dL   Calcium 7.7 (L) 8.4 - 10.5 mg/dL   GFR calc non Af Amer 35 (L) >90 mL/min   GFR calc Af Amer 40 (L) >90 mL/min    Comment: (NOTE) The eGFR has been calculated using the CKD EPI equation. This calculation has not been validated in all clinical situations. eGFR's persistently <90 mL/min signify possible Chronic Kidney Disease.    Anion gap 7 5 - 15  Basic metabolic panel     Status: Abnormal   Collection Time: 06/03/14  4:40 AM   Result Value Ref Range   Sodium 130 (L) 135 - 145 mmol/L   Potassium 4.9 3.5 - 5.1 mmol/L    Comment: DELTA CHECK NOTED   Chloride 101 96 - 112 mmol/L   CO2 26 19 - 32 mmol/L   Glucose, Bld 133 (H) 70 - 99 mg/dL   BUN 28 (H) 6 - 23 mg/dL   Creatinine, Ser 1.05 0.50 - 1.10 mg/dL   Calcium 6.7 (L) 8.4 - 10.5 mg/dL   GFR calc non Af Amer 44 (L) >90 mL/min   GFR calc Af Amer 51 (L) >90 mL/min    Comment: (NOTE) The eGFR has been calculated using the CKD EPI equation. This calculation has not been validated in all clinical situations. eGFR's persistently <90 mL/min signify possible Chronic Kidney Disease.    Anion gap 3 (L) 5 - 15  CBC     Status: Abnormal   Collection Time: 06/03/14  4:40 AM  Result Value Ref Range   WBC 7.6 4.0 - 10.5 K/uL   RBC 3.24 (L) 3.87 - 5.11 MIL/uL   Hemoglobin 10.2 (L) 12.0 - 15.0 g/dL   HCT 29.3 (L) 36.0 - 46.0 %   MCV 90.4 78.0 - 100.0 fL   MCH 31.5 26.0 - 34.0 pg   MCHC 34.8 30.0 -  36.0 g/dL   RDW 14.7 11.5 - 15.5 %   Platelets 228 150 - 400 K/uL    ABGS No results for input(s): PHART, PO2ART, TCO2, HCO3 in the last 72 hours.  Invalid input(s): PCO2 CULTURES Recent Results (from the past 240 hour(s))  Culture, expectorated sputum-assessment     Status: None   Collection Time: 05/27/14  8:49 PM  Result Value Ref Range Status   Specimen Description SPUTUM  Final   Special Requests NONE  Final   Sputum evaluation   Final    THIS SPECIMEN IS ACCEPTABLE. RESPIRATORY CULTURE REPORT TO FOLLOW. PERFORMED AT [facility]    Report Status 05/27/2014 FINAL  Final  Culture, respiratory (NON-Expectorated)     Status: None   Collection Time: 05/27/14  8:49 PM  Result Value Ref Range Status   Specimen Description SPUTUM EXPECTORATED  Final   Special Requests NONE  Final   Gram Stain   Final    FEW WBC PRESENT,BOTH PMN AND MONONUCLEAR FEW SQUAMOUS EPITHELIAL CELLS PRESENT FEW GRAM POSITIVE COCCI IN PAIRS RARE GRAM NEGATIVE RODS Performed at [facility]    Culture   Final    NORMAL OROPHARYNGEAL FLORA Performed at [facility]    Report Status 05/30/2014 FINAL  Final  Culture, Urine     Status: None   Collection Time: 05/27/14  8:50 PM  Result Value Ref Range Status   Specimen Description URINE, CLEAN CATCH  Final   Special Requests NONE  Final   Colony Count NO GROWTH Performed at [facility]   Final   Culture NO GROWTH Performed at [facility]   Final   Report Status 05/29/2014 FINAL  Final  Rapid strep screen     Status: None   Collection Time: 05/28/14 12:25 PM  Result Value Ref Range Status   Streptococcus, Group A Screen (Direct) NEGATIVE NEGATIVE Final    Comment: (NOTE) A Rapid Antigen test may result negative if the antigen level in the sample is below the detection level of this test. The FDA has not cleared this test as a stand-alone test therefore the rapid antigen negative result has reflexed to a Group A Strep culture.   Culture, Group A Strep     Status: None   Collection Time: 05/28/14 12:25 PM  Result Value Ref Range Status   Specimen Description THROAT  Final   Special Requests NONE  Final   Culture   Final    No Beta Hemolytic Streptococci Isolated Performed at [facility]    Report Status 05/30/2014 FINAL  Final  Culture, Urine     Status: None   Collection Time: 05/29/14  5:46 PM  Result Value Ref Range Status   Specimen Description URINE, CLEAN CATCH  Final   Special Requests NONE  Final   Colony Count NO GROWTH Performed at [facility]   Final   Culture NO GROWTH Performed at [facility]   Final   Report Status 05/31/2014 FINAL  Final  MRSA PCR Screening     Status: None   Collection Time: 05/31/14 10:04 AM  Result Value Ref Range Status   MRSA by PCR NEGATIVE NEGATIVE Final    Comment:        The GeneXpert MRSA Assay (FDA approved for NASAL specimens only), is one component of a comprehensive MRSA  colonization surveillance program. It is not intended to diagnose MRSA infection nor to guide or monitor treatment for MRSA infections.  Studies/Results: Dg Chest Port 1 View  06/03/2014   CLINICAL DATA:  Short of breath.  Followup abnormal lung density.  EXAM: PORTABLE CHEST - 1 VIEW  COMPARISON:  06/01/2014 and previous  FINDINGS: Right arm PICC has its tip in the SVC at the azygos level. Artifact overlies the chest elsewhere. The heart is enlarged. The aorta is unfolded. Abnormal density persists in both lower lobes consistent with atelectasis or patchy pneumonia. No new finding.  IMPRESSION: Persistent abnormal density in both lower lobes consistent with atelectasis and or pneumonia. No change.   Electronically Signed   By: Nelson Chimes M.D.   On: 06/03/2014 07:21    Medications:  Prior to Admission:  Prescriptions prior to admission  Medication Sig Dispense Refill Last Dose  . albuterol (PROVENTIL HFA;VENTOLIN HFA) 108 (90 BASE) MCG/ACT inhaler Inhale 2 puffs into the lungs every 6 (six) hours as needed for wheezing. 1 Inhaler 2 05/27/2014 at Unknown time  . allopurinol (ZYLOPRIM) 100 MG tablet TAKE TWO TABLETS BY MOUTH DAILY (Patient taking differently: Take 100 mg by mouth daily. ) 60 tablet 6 05/27/2014 at Unknown time  . amLODipine (NORVASC) 5 MG tablet TAKE ONE (1) TABLET EACH DAY 30 tablet 4 05/27/2014 at Unknown time  . aspirin EC 81 MG tablet Take 81 mg by mouth daily.   05/27/2014 at Unknown time  . azithromycin (ZITHROMAX) 250 MG tablet As directed (Patient taking differently: Take 250-500 mg by mouth daily. Take 2 tabs on day 1, then 1 tab daily until finished.  Starting 05/24/2014 x 5 days.) 6 tablet 0 05/27/2014 at Unknown time  . brinzolamide (AZOPT) 1 % ophthalmic suspension Place 1 drop into the right eye See admin instructions. Uses at 0800 and 1200.   05/27/2014 at Unknown time  . Cranberry 500 MG CAPS Take 1 capsule by mouth daily.   05/27/2014 at Unknown time  . levothyroxine  (SYNTHROID, LEVOTHROID) 75 MCG tablet TAKE ONE TABLET EVERY MORNING (Patient taking differently: Take 75 mcg by mouth daily before breakfast. ) 30 tablet 5 05/27/2014 at Unknown time  . loratadine (CLARITIN) 10 MG tablet Take 10 mg by mouth daily.   05/27/2014 at Unknown time  . LORazepam (ATIVAN) 0.5 MG tablet Take 1 tablet (0.5 mg total) by mouth 2 (two) times daily. As directed 30 tablet 5 05/27/2014 at Unknown time  . Multiple Vitamin (MULTIVITAMIN WITH MINERALS) TABS tablet Take 1 tablet by mouth daily.   05/27/2014 at Unknown time  . pantoprazole (PROTONIX) 40 MG tablet Take 1 tablet (40 mg total) by mouth daily. 30 tablet 5 05/27/2014 at Unknown time  . salmeterol (SEREVENT DISKUS) 50 MCG/DOSE diskus inhaler USE 1 INHALTAION TWICE DAILY (Patient taking differently: Inhale 1 puff into the lungs 2 (two) times daily. ) 60 g 5 05/27/2014 at Unknown time  . sertraline (ZOLOFT) 50 MG tablet TAKE 1/2 TABLET DAILY (Patient taking differently: Take 25 mg by mouth daily. ) 15 tablet 5 05/27/2014 at Unknown time  . theophylline (THEODUR) 300 MG 12 hr tablet Take 0.5 tablets (150 mg total) by mouth 2 (two) times daily. 1/2 tablet BID 60 tablet 5 05/27/2014 at Unknown time  . travoprost, benzalkonium, (TRAVATAN) 0.004 % ophthalmic solution Place 1 drop into the right eye at bedtime.   05/26/2014 at Unknown time   Scheduled: . antiseptic oral rinse  7 mL Mouth Rinse q12n4p  . aspirin EC  81 mg Oral Daily  . brinzolamide  1 drop Right Eye 2 times per day  .  budesonide-formoterol  2 puff Inhalation BID  . chlorhexidine  15 mL Mouth Rinse BID  . diltiazem  240 mg Oral Daily  . enoxaparin (LOVENOX) injection  40 mg Subcutaneous Q24H  . furosemide  20 mg Intravenous Daily  . guaiFENesin  1,200 mg Oral BID  . ipratropium  0.5 mg Nebulization Q4H  . latanoprost  1 drop Right Eye QHS  . levalbuterol  0.63 mg Nebulization Q4H  . levofloxacin  750 mg Oral Q48H  . levothyroxine  75 mcg Oral QAC breakfast  . loratadine  10 mg  Oral Daily  . LORazepam  0.5 mg Oral QHS  . methylPREDNISolone (SOLU-MEDROL) injection  125 mg Intravenous 3 times per day  . pantoprazole  40 mg Oral Daily  . sodium chloride  500 mL Intravenous Once  . sodium chloride  10-40 mL Intracatheter Q12H   Continuous:  KIS:[id number] **OR** acetaminophen, benzonatate, guaiFENesin-dextromethorphan, hydrALAZINE, ipratropium-albuterol, levalbuterol, LORazepam, menthol-cetylpyridinium, ondansetron **OR** ondansetron (ZOFRAN) IV, sodium chloride  Assesment: She was admitted with respiratory failure related to community-acquired pneumonia cardiomyopathy acute exacerbation of CHF and A. fib. She is doing better. She's had to be on BiPAP but now seems to be improving. Principal Problem:   Acute respiratory failure Active Problems:   Hypertension   Hyperlipemia   Hypothyroid   CKD (chronic kidney disease)   Asthma, chronic   Hyponatremia   CAP (community acquired pneumonia)   Acute bronchitis   Hypoxia   Positive D dimer   New onset a-fib   Cardiomyopathy   Acute exacerbation of CHF (congestive heart failure); Probable   COPD exacerbation    Plan: Continue treatments. Continue BiPAP as needed.    LOS: 8 days   Raveen Wieseler L 06/04/2014, 8:42 AM

## 2014-06-04 NOTE — Care Management Utilization Note (Signed)
UR completed 

## 2014-06-04 NOTE — Progress Notes (Signed)
INITIAL NUTRITION ASSESSMENT  DOCUMENTATION CODES Per approved criteria  -Severe malnutrition in the context of chronic illness -Obesity unspecified   Pt meets the criteria for severe MALNUTRITION in the context of chronic illness as evidenced by severe depletion of fat and muscle mass.  INTERVENTION: -Resource Breeze TID providing 250 kcal and 9 grams of protein  NUTRITION DIAGNOSIS: Inadequate oral intake related to decreased ability to eat as evidenced by respiratory status, currently on BiPAP.   Goal: Pt to meet >/= 90% of estimated needs.  Monitor:  Pt PO intake, supplement tolerance, weight trends, labs  Reason for Assessment: LOS = 8 d  81 y.o. female  Admitting Dx: Acute respiratory failure  ASSESSMENT: Pt admitted for CAP and asthma exacerbation. Pt hx significant for CKD II, HTN and asthma.  Pt currently on BiPAP, unable to respond at this moment.  Spoke with RN, reports when she is able to breath she can feed her self and will consume about 25- 50%.  Has not consumed any meals today because of BiPAP.  RN requested Boost Breeze be ordered over Ensure.    Weight records show weight gain in the past four months (possibly due to fluid accumulation).  Nutrition Focused Physical Exam:  Subcutaneous Fat:  Orbital Region: mild depletion Upper Arm Region: severe depletion Thoracic and Lumbar Region: n/a  Muscle:  Temple Region: mild depletion Clavicle Bone Region: mild depletion Clavicle and Acromion Bone Region: severe depletion  Scapular Bone Region: n/a Dorsal Hand: n/a Patellar Region: severe depletion Anterior Thigh Region: moderate depletion Posterior Calf Region: moderate depletion  Edema: none      Height: Ht Readings from Last 1 Encounters:  05/27/14  (1.626 m)    Weight: Wt Readings from Last 1 Encounters:  06/04/14 177 lb 7.5 oz (80.5 kg)    Ideal Body Weight: 120 lbs  % Ideal Body Weight: 147%  Wt Readings from Last 10  Encounters:  06/04/14 177 lb 7.5 oz (80.5 kg)  05/27/14 171 lb (77.565 kg)  04/02/14 174 lb (78.926 kg)  02/04/14 175 lb (79.379 kg)  12/25/13 179 lb (81.194 kg)  12/11/13 179 lb (81.194 kg)  11/15/13 180 lb (81.647 kg)  08/07/13 181 lb (82.101 kg)  05/03/13 185 lb (83.915 kg)  04/09/13 184 lb (83.462 kg)    Usual Body Weight: 185 lbs  % Usual Body Weight: 96%  BMI:  Body mass index is 30.45 kg/(m^2).  Estimated Nutritional Needs: Kcal: 1600-1800 kcal Protein: 100-115 g protein Fluid: >/= 1.6 L/day  Skin: Ecchymosis on left arm  Diet Order: Diet 2 gram sodium  EDUCATION NEEDS: -No education needs identified at this time   Intake/Output Summary (Last 24 hours) at 06/04/14 0909 Last data filed at 06/04/14 0500  Gross per 24 hour  Intake    120 ml  Output   2550 ml  Net  -2430 ml    Last BM: none charted   Labs:   Recent Labs Lab 05/31/14 0518  06/01/14 0326 06/01/14 0921  06/02/14 0304 06/02/14 1453 06/03/14 0440  NA 118*  < > 119* 124*  < > 127* 128* 130*  K 4.3  < > 4.1 4.6  < > 3.0* 4.0 4.9  CL 81*  < > 91* 92*  < > 95* 92* 101  CO2 23  < > 19 21  < > BUN 42*  < > 39* 39*  < > 36* 38* 28*  CREATININE 1.55*  < > 1.47* 1.44*  < >  1.33* 1.29* 1.05  CALCIUM 8.4  < > 7.5* 7.9*  < > 6.9* 7.7* 6.7*  MG 1.9  --  1.8 1.8  --   --   --   --   GLUCOSE 132*  < > 108* 128*  < > 153* 106* 133*  < > = values in this interval not displayed.  CBG (last 3)  No results for input(s): GLUCAP in the last 72 hours.  Scheduled Meds: . antiseptic oral rinse  7 mL Mouth Rinse q12n4p  . aspirin EC  81 mg Oral Daily  . brinzolamide  1 drop Right Eye 2 times per day  . budesonide-formoterol  2 puff Inhalation BID  . chlorhexidine  15 mL Mouth Rinse BID  . diltiazem  240 mg Oral Daily  . enoxaparin (LOVENOX) injection  40 mg Subcutaneous Q24H  . furosemide  20 mg Intravenous Daily  . guaiFENesin  1,200 mg Oral BID  . ipratropium  0.5 mg Nebulization Q4H  .  latanoprost  1 drop Right Eye QHS  . levalbuterol  0.63 mg Nebulization Q4H  . levofloxacin  750 mg Oral Q48H  . levothyroxine  75 mcg Oral QAC breakfast  . loratadine  10 mg Oral Daily  . LORazepam  0.5 mg Oral QHS  . methylPREDNISolone (SOLU-MEDROL) injection  125 mg Intravenous 3 times per day  . pantoprazole  40 mg Oral Daily  . sodium chloride  500 mL Intravenous Once  . sodium chloride  10-40 mL Intracatheter Q12H    Continuous Infusions:   Past Medical History  Diagnosis Date  . Essential hypertension   . Hypothyroidism   . Asthma   . Hyperlipidemia   . Gout   . CKD (chronic kidney disease) stage 3, GFR 30-59 ml/min   . Ascending aorta dilation     4.2 cm 2011   . Atrial fibrillation     Documented February 2016 - question paroxysmal     Past Surgical History  Procedure Laterality Date  . Rotator cuff repair      Magdalen SpatzLauren Sherolyn Trettin MS Dietetic Intern Pager Number [phone]

## 2014-06-05 DIAGNOSIS — I519 Heart disease, unspecified: Secondary | ICD-10-CM | POA: Insufficient documentation

## 2014-06-05 DIAGNOSIS — J4541 Moderate persistent asthma with (acute) exacerbation: Secondary | ICD-10-CM

## 2014-06-05 DIAGNOSIS — E43 Unspecified severe protein-calorie malnutrition: Secondary | ICD-10-CM

## 2014-06-05 DIAGNOSIS — N183 Chronic kidney disease, stage 3 (moderate): Secondary | ICD-10-CM

## 2014-06-05 LAB — ALBUMIN: Albumin: 2.9 g/dL — ABNORMAL LOW (ref 3.5–5.2)

## 2014-06-05 MED ORDER — ENSURE COMPLETE PO LIQD
237.0000 mL | Freq: Three times a day (TID) | ORAL | Status: DC
  Administered 2014-06-05 – 2014-06-08 (×10): 237 mL via ORAL

## 2014-06-05 MED ORDER — POLYETHYLENE GLYCOL 3350 17 G PO PACK
17.0000 g | PACK | Freq: Two times a day (BID) | ORAL | Status: DC
  Administered 2014-06-05 – 2014-06-08 (×7): 17 g via ORAL
  Filled 2014-06-05 (×7): qty 1

## 2014-06-05 MED ORDER — METHYLPREDNISOLONE SODIUM SUCC 125 MG IJ SOLR
80.0000 mg | Freq: Three times a day (TID) | INTRAMUSCULAR | Status: DC
  Administered 2014-06-05 – 2014-06-06 (×3): 80 mg via INTRAVENOUS
  Filled 2014-06-05 (×3): qty 2

## 2014-06-05 MED ORDER — NYSTATIN 100000 UNIT/ML MT SUSP
5.0000 mL | Freq: Four times a day (QID) | OROMUCOSAL | Status: DC
  Administered 2014-06-05 – 2014-06-08 (×13): 500000 [IU] via ORAL
  Filled 2014-06-05 (×13): qty 5

## 2014-06-05 MED ORDER — POTASSIUM CHLORIDE CRYS ER 20 MEQ PO TBCR
20.0000 meq | EXTENDED_RELEASE_TABLET | Freq: Once | ORAL | Status: AC
  Administered 2014-06-05: 20 meq via ORAL
  Filled 2014-06-05: qty 1

## 2014-06-05 NOTE — Progress Notes (Signed)
Patient: Daisy Scott / Admit Date: 05/27/2014 / Date of Encounter: 06/05/2014, 1:14 PM   Subjective: Breathing better but still with cough. No CP.   Objective: Telemetry: NSR with LBBB, PACs, brief PAT, occasional PVCs and 3-4 beats NSVT Physical Exam: Blood pressure 93/64, pulse 71, temperature 97.5 F (36.4 C), temperature source Oral, resp. rate 15, height  (1.626 m), weight 175 lb 14.8 oz (79.8 kg), SpO2 94 %. General: Frail elderly F in no acute distress. Head: Normocephalic, atraumatic, sclera non-icteric, no xanthomas, nares are without discharge. Neck:  JVP not elevated. Lungs: Coarse throughout. No wheezing. Breathing is unlabored. Heart: RRR S1 S2 occasional ectopy without murmurs, rubs, or gallops.  Abdomen: Soft, non-tender, non-distended with normoactive bowel sounds. No rebound/guarding. Extremities: No clubbing or cyanosis. No edema. Distal pedal pulses are 2+ and equal bilaterally. Neuro: Alert and oriented X 3. Moves all extremities spontaneously. Psych:  Responds to questions appropriately with a normal affect.   Intake/Output Summary (Last 24 hours) at 06/05/14 1314 Last data filed at 06/05/14 0900  Gross per 24 hour  Intake    240 ml  Output   2425 ml  Net  -2185 ml    Inpatient Medications:  . antiseptic oral rinse  7 mL Mouth Rinse q12n4p  . aspirin EC  81 mg Oral Daily  . brinzolamide  1 drop Right Eye 2 times per day  . budesonide-formoterol  2 puff Inhalation BID  . chlorhexidine  15 mL Mouth Rinse BID  . diltiazem  240 mg Oral Daily  . enoxaparin (LOVENOX) injection  40 mg Subcutaneous Q24H  . feeding supplement (ENSURE COMPLETE)  237 mL Oral TID BM  . furosemide  20 mg Intravenous Daily  . guaiFENesin  1,200 mg Oral BID  . ipratropium  0.5 mg Nebulization Q6H  . latanoprost  1 drop Right Eye QHS  . levalbuterol  0.63 mg Nebulization Q6H  . levothyroxine  75 mcg Oral QAC breakfast  . loratadine  10 mg Oral Daily  . LORazepam  0.5 mg  Oral QHS  . methylPREDNISolone (SOLU-MEDROL) injection  80 mg Intravenous 3 times per day  . nystatin  5 mL Oral QID  . pantoprazole  40 mg Oral Daily  . polyethylene glycol  17 g Oral BID  . sodium chloride  10-40 mL Intracatheter Q12H   Infusions:    Labs:  Recent Labs  06/03/14 0440 06/04/14 0833  NA 130* 134*  K 4.9 3.5  CL 101 98  CO2 26 30  GLUCOSE 133* 122*  BUN 28* 29*  CREATININE 1.05 1.14*  CALCIUM 6.7* 8.1*    Recent Labs  06/05/14 0423  ALBUMIN 2.9*    Recent Labs  06/03/14 0440 06/04/14 0833  WBC 7.6 8.0  HGB 10.2* 12.2  HCT 29.3* 36.3  MCV 90.4 91.4  PLT 228 299   No results for input(s): CKTOTAL, CKMB, TROPONINI in the last 72 hours. Invalid input(s): POCBNP No results for input(s): HGBA1C in the last 72 hours.   Radiology/Studies:  Dg Chest 2 View  05/30/2014   CLINICAL DATA:  Cough and shortness of Breath progressing over the past 4 days.  EXAM: CHEST  2 VIEW  COMPARISON:  05/27/2014  FINDINGS: The heart is enlarged. There is tortuosity, ectasia and calcification of the thoracic aorta. New bibasilar densities suspicious for pneumonia. No pulmonary edema or pleural effusion.  IMPRESSION: Suspect new bibasilar infiltrates.   Electronically Signed   By: Rudie Meyer M.D.   On: 05/30/2014  15:52   Dg Chest 2 View  05/27/2014   ADDENDUM REPORT: 05/27/2014 11:31  ADDENDUM: In the body of the report it should read no acute bony abnormality. In the impression report issued. Small bilateral pleural effusions appear. Similar findings were noted on prior chest x-ray of 1 /15/ 2015.   Electronically Signed   By: Maisie Fushomas  Register   On: 05/27/2014 11:31   05/27/2014   CLINICAL DATA:  Congestion x1 week .  EXAM: CHEST  2 VIEW  COMPARISON:  None.  FINDINGS: Mediastinum and hilar structures are normal. Cardiomegaly with mild pulmonary vascular prominence. Bilateral small pleural effusions are noted. No focal infiltrate. No acute bony abnormality P  IMPRESSION: 1.  Cardiomegaly with mild pulmonary vascular prominence. 2. Small bilateral pleural effusions appear  Electronically Signed: ByMaisie Fus: Thomas  Register On: 05/27/2014 10:32   Nm Pulmonary Perf And Vent  05/29/2014   CLINICAL DATA:  Shortness of breath  EXAM: NUCLEAR MEDICINE VENTILATION - PERFUSION LUNG SCAN  TECHNIQUE: Ventilation images were obtained in multiple projections using inhaled aerosol technetium 99 M DTPA. Perfusion images were obtained in multiple projections after intravenous injection of Tc-6651m MAA.  RADIOPHARMACEUTICALS:  42 mCi Tc-5251m DTPA aerosol and 6 mCi Tc-2751m MAA  COMPARISON:  05/27/2014.  FINDINGS: Ventilation: Ventilation images show some patchy changes likely related to COPD. Central trapping of tracer is noted as well. No large ventilation defect is noted.  Perfusion: Overlying defect from the patient's arms is noted. No large segmental perfusion defect to suggest pulmonary embolism is identified. Changes consistent with a small pleural effusion seen on recent chest x-ray are noted.  IMPRESSION: Changes consistent with COPD in a right-sided pleural effusion.  No definitive pulmonary embolism is seen.   Electronically Signed   By: Alcide CleverMark  Lukens M.D.   On: 05/29/2014 12:03   [place] Venous Img Lower Unilateral Right  05/28/2014   CLINICAL DATA:  Right leg swelling  EXAM: RIGHT LOWER EXTREMITY VENOUS DUPLEX ULTRASOUND  TECHNIQUE: Doppler venous assessment of the right lower extremity deep venous system was performed, including characterization of spectral flow, compressibility, and phasicity.  COMPARISON:  None.  FINDINGS: There is complete compressibility of the right common femoral, femoral, and popliteal veins. Doppler analysis demonstrates respiratory phasicity and augmentation of flow upon calf compression. No obvious calf vein thrombosis.  IMPRESSION: No evidence of right lower extremity DVT.   Electronically Signed   By: Jolaine ClickArthur  Hoss M.D.   On: 05/28/2014 10:16   Dg Chest Port 1  View  06/03/2014   CLINICAL DATA:  Short of breath.  Followup abnormal lung density.  EXAM: PORTABLE CHEST - 1 VIEW  COMPARISON:  06/01/2014 and previous  FINDINGS: Right arm PICC has its tip in the SVC at the azygos level. Artifact overlies the chest elsewhere. The heart is enlarged. The aorta is unfolded. Abnormal density persists in both lower lobes consistent with atelectasis or patchy pneumonia. No new finding.  IMPRESSION: Persistent abnormal density in both lower lobes consistent with atelectasis and or pneumonia. No change.   Electronically Signed   By: Paulina FusiMark  Shogry M.D.   On: 06/03/2014 07:21   Dg Chest Port 1 View  06/01/2014   CLINICAL DATA:  PICC line placement. Acute respiratory failure, pneumonia, cough, CHF, COPD.  EXAM: PORTABLE CHEST - 1 VIEW  COMPARISON:  05/31/2014 and prior radiographs  FINDINGS: A right PICC line is now noted with tip overlying the mid SVC.  Cardiomegaly and pulmonary vascular congestion again noted.  Stable opacities overlying the  mid and lower right lung again noted.  There is no evidence of pneumothorax.  Mild bibasilar atelectasis is present.  IMPRESSION: Right PICC line with tip overlying the mid SVC.  Otherwise unchanged chest radiograph.   Electronically Signed   By: Harmon Pier M.D.   On: 06/01/2014 13:53   Dg Chest Port 1 View  05/31/2014   CLINICAL DATA:  Shortness of breath.  EXAM: PORTABLE CHEST - 1 VIEW  COMPARISON:  05/30/2014 and 05/27/2014.  FINDINGS: There appears to be increased densities in the right hilum and medial right lung base. Heart size is upper limits of normal but minimally changed. Upper lungs appear to be clear.  IMPRESSION: Questionable densities in the right hilum and right lower lung region. Foci of airspace disease or infection cannot be excluded. Recommend continued follow-up.   Electronically Signed   By: Richarda Overlie M.D.   On: 05/31/2014 08:20     Assessment and Plan   1. Acute respiratory failure likely multifactorial including  COPD exacerbation, community-acquired pneumonia, complicated by heart failure and atrial fibrillation - on steroids, nebs 2. Atrial fibrillation with RVR - elevated troponin to a peak of 0.07 felt 2/2 to this plus demand ischemia 3. Acute systolic CHF EF 25-30% with chronic LBBB 4. Hyponatremia suspected due to CHF, improving 5. AKI on CKD stage III 6. Hypothyroidism 7. Severe malnutrition  Clinically improving. CHADSVASC is 5 but patient's family declined anticoagulation. They affirmed their decision today in the room. Continue diltiazem. Not on BB due to wheezing, not on ACEI due to renal insufficiency. She is running softer BP's this afternoon - she does not have any further antihypertensives or diuretics due today. I have discontinued tomorrow's dose of IV Lasix (  daily) so that she can be rounded on before decisions are made about continuing diuretics. Sometimes these BP drops precipitate worsened renal function and I would prefer she be assessed by the team in the morning before receiving further diuretics. With frequent ectopy I would not be surprised if AF recurs, at which time amiodarone might be useful. Will give KCl x 1 as K was 3.5 yesterday prior to further diuresis, and add Mg to tomorrow morning's labs.  Agree with Dr. Eden Emms that with multiple comorbidities a goals of care discussion may be helpful. She is DNR.  Signed, Ronie Spies PA-C  The patient was seen and examined, and I agree with the assessment and plan as documented above, with modifications as noted below. Pt is improving from a cardiopulmonary standpoint but overall prognosis is poor due to multiple comorbidities including severe malnutrition, severe left ventricular dysfunction, and advanced age. I had a lengthy discussion with her son. He emphasizes the request to withhold anticoagulation altogether. Continue long-acting diltiazem for adequate rate control. No beta blockers due to precipitation of wheezing  last week. No ACEI/ARB due to CKD. I agree with holding IV diuresis for now and possibly starting oral diuretics tomorrow once her volume status has been assessed. Agree with consideration for palliative care discussion.

## 2014-06-05 NOTE — Progress Notes (Signed)
Daisy Scott  MRN: [id number]  DOB/AGE: 79-Nov-1920 79 y.o.  Primary Care Physician:MOORE, DONALD, MD  Admit date: 05/27/2014  Chief Complaint: No chief complaint on file.   [name] presented on  05/27/2014 with No chief complaint on file. .   Pt offers no new complaints.   Pt caregiver present the room, offers no new concerns.   Meds . antiseptic oral rinse  7 mL Mouth Rinse q12n4p  . aspirin EC  81 mg Oral Daily  . brinzolamide  1 drop Right Eye 2 times per day  . budesonide-formoterol  2 puff Inhalation BID  . chlorhexidine  15 mL Mouth Rinse BID  . diltiazem  240 mg Oral Daily  . enoxaparin (LOVENOX) injection  40 mg Subcutaneous Q24H  . feeding supplement (RESOURCE BREEZE)  1 Container Oral TID BM  . furosemide  20 mg Intravenous Daily  . guaiFENesin  1,200 mg Oral BID  . ipratropium  0.5 mg Nebulization Q6H  . latanoprost  1 drop Right Eye QHS  . levalbuterol  0.63 mg Nebulization Q6H  . levothyroxine  75 mcg Oral QAC breakfast  . loratadine  10 mg Oral Daily  . LORazepam  0.5 mg Oral QHS  . methylPREDNISolone (SOLU-MEDROL) injection  125 mg Intravenous 3 times per day  . pantoprazole  40 mg Oral Daily  . sodium chloride  500 mL Intravenous Once  . sodium chloride  10-40 mL Intracatheter Q12H      Physical Exam: Vital signs in last 24 hours: Temp:  [97.5 F (36.4 C)-98.9 F (37.2 C)] 97.5 F (36.4 C) (02/17 0739) Pulse Rate:  [45-127] 71 (02/17 0800) Resp:  [10-29] 15 (02/17 0800) BP: (87-143)/(37-111) 93/64 mmHg (02/17 0800) SpO2:  [69 %-100 %] 94 % (02/17 0800) FiO2 (%):  [30 %] 30 % (02/17 0223) Weight:  [175 lb 14.8 oz (79.8 kg)] 175 lb 14.8 oz (79.8 kg) (02/17 0500) Weight change: -1 lb 8.7 oz (-0.7 kg) Last BM Date: 05/28/14  Intake/Output from previous day: 02/16 0701 - 02/17 0700 In: -  Out: 2425 [Urine:2425] Total I/O In: 240 [P.O.:240] Out: -    Physical Exam: General- pt is awake ( better than yesterday) Resp- No acute REsp distress,  Minimal ronchi CVS- S1S2 irregular in rate and rhythm GIT- BS+, soft, NT, ND EXT- Trace LE Edema,NO Cyanosis   Lab Results: CBC  Recent Labs  06/03/14 0440 06/04/14 0833  WBC 7.6 8.0  HGB 10.2* 12.2  HCT 29.3* 36.3  PLT 228 299    BMET  Recent Labs  06/03/14 0440 06/04/14 0833  NA 130* 134*  K 4.9 3.5  CL 101 98  CO2 26 30  GLUCOSE 133* 122*  BUN 28* 29*  CREATININE 1.05 1.14*  CALCIUM 6.7* 8.1*   Trend  Sodium 115=>124=>130=>134  Creat 1.67=>1.05-1.14     MICRO Recent Results (from the past 240 hour(Scott))  Culture, expectorated sputum-assessment     Status: None   Collection Time: 05/27/14  8:49 PM  Result Value Ref Range Status   Specimen Description SPUTUM  Final   Special Requests NONE  Final   Sputum evaluation   Final    THIS SPECIMEN IS ACCEPTABLE. RESPIRATORY CULTURE REPORT TO FOLLOW. PERFORMED AT [facility]    Report Status 05/27/2014 FINAL  Final  Culture, respiratory (NON-Expectorated)     Status: None   Collection Time: 05/27/14  8:49 PM  Result Value Ref Range Status   Specimen Description SPUTUM EXPECTORATED  Final   Special Requests NONE  Final   Gram Stain   Final    FEW WBC PRESENT,BOTH PMN AND MONONUCLEAR FEW SQUAMOUS EPITHELIAL CELLS PRESENT FEW GRAM POSITIVE COCCI IN PAIRS RARE GRAM NEGATIVE RODS Performed at [facility]    Culture   Final    NORMAL OROPHARYNGEAL FLORA Performed at [facility]    Report Status 05/30/2014 FINAL  Final  Culture, Urine     Status: None   Collection Time: 05/27/14  8:50 PM  Result Value Ref Range Status   Specimen Description URINE, CLEAN CATCH  Final   Special Requests NONE  Final   Colony Count NO GROWTH Performed at [facility]   Final   Culture NO GROWTH Performed at [facility]   Final   Report Status 05/29/2014 FINAL  Final  Rapid strep screen     Status: None   Collection Time: 05/28/14 12:25 PM  Result Value Ref Range Status   Streptococcus, Group  A Screen (Direct) NEGATIVE NEGATIVE Final    Comment: (NOTE) A Rapid Antigen test may result negative if the antigen level in the sample is below the detection level of this test. The FDA has not cleared this test as a stand-alone test therefore the rapid antigen negative result has reflexed to a Group A Strep culture.   Culture, Group A Strep     Status: None   Collection Time: 05/28/14 12:25 PM  Result Value Ref Range Status   Specimen Description THROAT  Final   Special Requests NONE  Final   Culture   Final    No Beta Hemolytic Streptococci Isolated Performed at [facility]    Report Status 05/30/2014 FINAL  Final  Culture, Urine     Status: None   Collection Time: 05/29/14  5:46 PM  Result Value Ref Range Status   Specimen Description URINE, CLEAN CATCH  Final   Special Requests NONE  Final   Colony Count NO GROWTH Performed at [facility]   Final   Culture NO GROWTH Performed at [facility]   Final   Report Status 05/31/2014 FINAL  Final  MRSA PCR Screening     Status: None   Collection Time: 05/31/14 10:04 AM  Result Value Ref Range Status   MRSA by PCR NEGATIVE NEGATIVE Final    Comment:        The GeneXpert MRSA Assay (FDA approved for NASAL specimens only), is one component of a comprehensive MRSA colonization surveillance program. It is not intended to diagnose MRSA infection nor to guide or monitor treatment for MRSA infections.       Lab Results  Component Value Date   CALCIUM 8.1* 06/04/2014   Albumin 2.9 Corrected Calcium 9.0    Impression: 1)Renal  AKI secondary to Prerenal/ATN               AKI now better               CKD stage 3.               CKD since 2009               CKD secondary to HTN/Age associated decline                Progression of CKD slow          2)HTN  Medication- On Diuretics. On Calcium Channel Blockers On Vasodilators  3)Anemia HGb at goal (9--11)   4)CKD Mineral-Bone  Disorder  Corrected calcium is at goal.  5)Hyponatremia- Hypervolemic Hyponatremia    Improving  6)Hypokalemia   Sec to Diuretics   Improving  7)Acid base Co2 at goal     Plan:  Will continue current care Will check BMet     Daisy Scott 06/05/2014, 9:49 AM

## 2014-06-05 NOTE — Progress Notes (Signed)
Subjective: She is awake and alert and off BiPAP this morning. She says she feels much better. She has no new complaints. She says her breathing is better. She is still coughing some.  Objective: Vital signs in last 24 hours: Temp:  [97.4 F (36.3 C)-98.9 F (37.2 C)] 97.5 F (36.4 C) (02/17 0739) Pulse Rate:  [45-127] 68 (02/17 0600) Resp:  [10-29] 29 (02/17 0600) BP: (87-134)/(37-111) 124/70 mmHg (02/17 0600) SpO2:  [69 %-100 %] 96 % (02/17 0707) FiO2 (%):  [30 %] 30 % (02/17 0223) Weight:  [79.8 kg (175 lb 14.8 oz)] 79.8 kg (175 lb 14.8 oz) (02/17 0500) Weight change: -0.7 kg (-1 lb 8.7 oz) Last BM Date: 05/28/14  Intake/Output from previous day: 02/16 0701 - 02/17 0700 In: -  Out: 2425 [Urine:2425]  PHYSICAL EXAM General appearance: alert, cooperative and mild distress Resp: rhonchi bilaterally Cardio: irregularly irregular rhythm GI: soft, non-tender; bowel sounds normal; no masses,  no organomegaly Extremities: extremities normal, atraumatic, no cyanosis or edema  Lab Results:  Results for orders placed or performed during the hospital encounter of 05/27/14 (from the past 48 hour(s))  CBC     Status: None   Collection Time: 06/04/14  8:33 AM  Result Value Ref Range   WBC 8.0 4.0 - 10.5 K/uL   RBC 3.97 3.87 - 5.11 MIL/uL   Hemoglobin 12.2 12.0 - 15.0 g/dL   HCT 36.3 36.0 - 46.0 %   MCV 91.4 78.0 - 100.0 fL   MCH 30.7 26.0 - 34.0 pg   MCHC 33.6 30.0 - 36.0 g/dL   RDW 15.1 11.5 - 15.5 %   Platelets 299 150 - 400 K/uL    Comment: DELTA CHECK NOTED  Basic metabolic panel     Status: Abnormal   Collection Time: 06/04/14  8:33 AM  Result Value Ref Range   Sodium 134 (L) 135 - 145 mmol/L   Potassium 3.5 3.5 - 5.1 mmol/L    Comment: DELTA CHECK NOTED   Chloride 98 96 - 112 mmol/L   CO2 30 19 - 32 mmol/L   Glucose, Bld 122 (H) 70 - 99 mg/dL   BUN 29 (H) 6 - 23 mg/dL   Creatinine, Ser 1.14 (H) 0.50 - 1.10 mg/dL   Calcium 8.1 (L) 8.4 - 10.5 mg/dL   GFR calc non Af  Amer 40 (L) >90 mL/min   GFR calc Af Amer 47 (L) >90 mL/min    Comment: (NOTE) The eGFR has been calculated using the CKD EPI equation. This calculation has not been validated in all clinical situations. eGFR's persistently <90 mL/min signify possible Chronic Kidney Disease.    Anion gap 6 5 - 15  Blood gas, arterial     Status: Abnormal   Collection Time: 06/04/14 10:38 AM  Result Value Ref Range   FIO2 30.00 %   Delivery systems BILEVEL POSITIVE AIRWAY PRESSURE    Mode BILEVEL POSITIVE AIRWAY PRESSURE    Inspiratory PAP 14    Expiratory PAP 7    pH, Arterial 7.478 (H) 7.350 - 7.450   pCO2 arterial 41.0 35.0 - 45.0 mmHg   pO2, Arterial 79.8 (L) 80.0 - 100.0 mmHg   Bicarbonate 30.0 (H) 20.0 - 24.0 mEq/L   TCO2 26.3 0 - 100 mmol/L   Acid-Base Excess 6.3 (H) 0.0 - 2.0 mmol/L   O2 Saturation 96.0 %   Patient temperature 37.0    Collection site LEFT BRACHIAL    Drawn by [id number]    Sample type  ARTERIAL    Allens test (pass/fail) PASS PASS  Albumin     Status: Abnormal   Collection Time: 06/05/14  4:23 AM  Result Value Ref Range   Albumin 2.9 (L) 3.5 - 5.2 g/dL    ABGS  Recent Labs  06/04/14 1038  PHART 7.478*  PO2ART 79.8*  TCO2 26.3  HCO3 30.0*   CULTURES Recent Results (from the past 240 hour(s))  Culture, expectorated sputum-assessment     Status: None   Collection Time: 05/27/14  8:49 PM  Result Value Ref Range Status   Specimen Description SPUTUM  Final   Special Requests NONE  Final   Sputum evaluation   Final    THIS SPECIMEN IS ACCEPTABLE. RESPIRATORY CULTURE REPORT TO FOLLOW. PERFORMED AT [facility]    Report Status 05/27/2014 FINAL  Final  Culture, respiratory (NON-Expectorated)     Status: None   Collection Time: 05/27/14  8:49 PM  Result Value Ref Range Status   Specimen Description SPUTUM EXPECTORATED  Final   Special Requests NONE  Final   Gram Stain   Final    FEW WBC PRESENT,BOTH PMN AND MONONUCLEAR FEW SQUAMOUS EPITHELIAL CELLS PRESENT FEW GRAM  POSITIVE COCCI IN PAIRS RARE GRAM NEGATIVE RODS Performed at [facility]    Culture   Final    NORMAL OROPHARYNGEAL FLORA Performed at [facility]    Report Status 05/30/2014 FINAL  Final  Culture, Urine     Status: None   Collection Time: 05/27/14  8:50 PM  Result Value Ref Range Status   Specimen Description URINE, CLEAN CATCH  Final   Special Requests NONE  Final   Colony Count NO GROWTH Performed at [facility]   Final   Culture NO GROWTH Performed at [facility]   Final   Report Status 05/29/2014 FINAL  Final  Rapid strep screen     Status: None   Collection Time: 05/28/14 12:25 PM  Result Value Ref Range Status   Streptococcus, Group A Screen (Direct) NEGATIVE NEGATIVE Final    Comment: (NOTE) A Rapid Antigen test may result negative if the antigen level in the sample is below the detection level of this test. The FDA has not cleared this test as a stand-alone test therefore the rapid antigen negative result has reflexed to a Group A Strep culture.   Culture, Group A Strep     Status: None   Collection Time: 05/28/14 12:25 PM  Result Value Ref Range Status   Specimen Description THROAT  Final   Special Requests NONE  Final   Culture   Final    No Beta Hemolytic Streptococci Isolated Performed at [facility]    Report Status 05/30/2014 FINAL  Final  Culture, Urine     Status: None   Collection Time: 05/29/14  5:46 PM  Result Value Ref Range Status   Specimen Description URINE, CLEAN CATCH  Final   Special Requests NONE  Final   Colony Count NO GROWTH Performed at [facility]   Final   Culture NO GROWTH Performed at [facility]   Final   Report Status 05/31/2014 FINAL  Final  MRSA PCR Screening     Status: None   Collection Time: 05/31/14 10:04 AM  Result Value Ref Range Status   MRSA by PCR NEGATIVE NEGATIVE Final    Comment:        The GeneXpert MRSA Assay (FDA approved for NASAL  specimens only), is one component of  a comprehensive MRSA colonization surveillance program. It is not intended to diagnose MRSA infection nor to guide or monitor treatment for MRSA infections.    Studies/Results: No results found.  Medications:  Prior to Admission:  Prescriptions prior to admission  Medication Sig Dispense Refill Last Dose  . albuterol (PROVENTIL HFA;VENTOLIN HFA) 108 (90 BASE) MCG/ACT inhaler Inhale 2 puffs into the lungs every 6 (six) hours as needed for wheezing. 1 Inhaler 2 05/27/2014 at Unknown time  . allopurinol (ZYLOPRIM) 100 MG tablet TAKE TWO TABLETS BY MOUTH DAILY (Patient taking differently: Take 100 mg by mouth daily. ) 60 tablet 6 05/27/2014 at Unknown time  . amLODipine (NORVASC) 5 MG tablet TAKE ONE (1) TABLET EACH DAY 30 tablet 4 05/27/2014 at Unknown time  . aspirin EC 81 MG tablet Take 81 mg by mouth daily.   05/27/2014 at Unknown time  . azithromycin (ZITHROMAX) 250 MG tablet As directed (Patient taking differently: Take 250-500 mg by mouth daily. Take 2 tabs on day 1, then 1 tab daily until finished.  Starting 05/24/2014 x 5 days.) 6 tablet 0 05/27/2014 at Unknown time  . brinzolamide (AZOPT) 1 % ophthalmic suspension Place 1 drop into the right eye See admin instructions. Uses at 0800 and 1200.   05/27/2014 at Unknown time  . Cranberry 500 MG CAPS Take 1 capsule by mouth daily.   05/27/2014 at Unknown time  . levothyroxine (SYNTHROID, LEVOTHROID) 75 MCG tablet TAKE ONE TABLET EVERY MORNING (Patient taking differently: Take 75 mcg by mouth daily before breakfast. ) 30 tablet 5 05/27/2014 at Unknown time  . loratadine (CLARITIN) 10 MG tablet Take 10 mg by mouth daily.   05/27/2014 at Unknown time  . LORazepam (ATIVAN) 0.5 MG tablet Take 1 tablet (0.5 mg total) by mouth 2 (two) times daily. As directed 30 tablet 5 05/27/2014 at Unknown time  . Multiple Vitamin (MULTIVITAMIN WITH MINERALS) TABS tablet Take 1 tablet by mouth daily.   05/27/2014 at Unknown time  . pantoprazole  (PROTONIX) 40 MG tablet Take 1 tablet (40 mg total) by mouth daily. 30 tablet 5 05/27/2014 at Unknown time  . salmeterol (SEREVENT DISKUS) 50 MCG/DOSE diskus inhaler USE 1 INHALTAION TWICE DAILY (Patient taking differently: Inhale 1 puff into the lungs 2 (two) times daily. ) 60 g 5 05/27/2014 at Unknown time  . sertraline (ZOLOFT) 50 MG tablet TAKE 1/2 TABLET DAILY (Patient taking differently: Take 25 mg by mouth daily. ) 15 tablet 5 05/27/2014 at Unknown time  . theophylline (THEODUR) 300 MG 12 hr tablet Take 0.5 tablets (150 mg total) by mouth 2 (two) times daily. 1/2 tablet BID 60 tablet 5 05/27/2014 at Unknown time  . travoprost, benzalkonium, (TRAVATAN) 0.004 % ophthalmic solution Place 1 drop into the right eye at bedtime.   05/26/2014 at Unknown time   Scheduled: . antiseptic oral rinse  7 mL Mouth Rinse q12n4p  . aspirin EC  81 mg Oral Daily  . brinzolamide  1 drop Right Eye 2 times per day  . budesonide-formoterol  2 puff Inhalation BID  . chlorhexidine  15 mL Mouth Rinse BID  . diltiazem  240 mg Oral Daily  . enoxaparin (LOVENOX) injection  40 mg Subcutaneous Q24H  . feeding supplement (RESOURCE BREEZE)  1 Container Oral TID BM  . furosemide  20 mg Intravenous Daily  . guaiFENesin  1,200 mg Oral BID  . ipratropium  0.5 mg Nebulization Q6H  . latanoprost  1 drop Right Eye QHS  . levalbuterol  0.63  mg Nebulization Q6H  . levofloxacin  750 mg Oral Q48H  . levothyroxine  75 mcg Oral QAC breakfast  . loratadine  10 mg Oral Daily  . LORazepam  0.5 mg Oral QHS  . methylPREDNISolone (SOLU-MEDROL) injection  125 mg Intravenous 3 times per day  . pantoprazole  40 mg Oral Daily  . sodium chloride  500 mL Intravenous Once  . sodium chloride  10-40 mL Intracatheter Q12H   Continuous:  VHQ:[id number] **OR** acetaminophen, benzonatate, guaiFENesin-dextromethorphan, hydrALAZINE, ipratropium-albuterol, levalbuterol, LORazepam, menthol-cetylpyridinium, ondansetron **OR** ondansetron (ZOFRAN) IV,  sodium chloride  Assesment: She came to the hospital with cough and congestion and shortness of breath. She was felt to have probable community-acquired pneumonia. She has since developed rapid atrial fibrillation and acute exacerbation of congestive heart failure. She has been treated with BiPAP and Lasix antibiotics steroids and inhaled bronchodilators and has improved. She needs to BiPAP less now. She had been hyponatremic and that has improved. Her heart failure looks better. Her breathing is generally better. Principal Problem:   Acute respiratory failure Active Problems:   Hypertension   Hyperlipemia   Hypothyroid   CKD (chronic kidney disease)   Asthma, chronic   Hyponatremia   CAP (community acquired pneumonia)   Acute bronchitis   Hypoxia   Positive D dimer   New onset a-fib   Cardiomyopathy   Acute exacerbation of CHF (congestive heart failure); Probable   COPD exacerbation   Protein-calorie malnutrition, severe    Plan: Continue current treatments. She is improving with current treatments    LOS: 9 days   Neyra Pettie L 06/05/2014, 7:47 AM

## 2014-06-05 NOTE — Progress Notes (Signed)
PROGRESS NOTE  Daisy Scott FAO:[id number] DOB: 1919-03-11 DOA: 05/27/2014 PCP: Rudi Heap, MD  Summary: 81 year old woman presented with shortness of breath, treated for bronchospasm, developed rapid atrial fibrillation, seen by cardiology, declined anticoagulation. Developed multifactorial respiratory failure including COPD exacerbation, acute systolic congestive heart failure, pneumonia. Treated with BiPAP. Diuresed well with Lasix. Still requires BiPAP at night. Followed by nephrology for hyponatremia.  Assessment/Plan: 1. Acute respiratory failure: Improving. Likely multifactorial including COPD exacerbation, community-acquired pneumonia, complicated by heart failure and atrial fibrillation. VQ scan negative for pulmonary embolism. 2. Presumed community acquired pneumonia, appears to be improving. 3. Acute COPD exacerbation. Appears to be improving. 4. Atrial fibrillation with RVR. Thought secondary to underlying pulmonology issues. CHADS2 = 5. Patient's daughter declines anticoagulation. TSH normal. 2-D echocardiogram revealed systolic dysfunction with diffuse hypokinesis. Lopressor changed to Cardizem because of wheezing. 5. Acute systolic congestive heart failure, suspected ischemic cardiomyopathy. LVEF 25-30 percent. Initially treated with beta blocker which worsened wheezing. 6. Hyponatremia suspect CHF. Nearly normal now. Followed by nephrology. Treated with fluid restriction, Lasix and fluids with improvement. 7. AKI superimposed on CKD stage III appears to be better than baseline. 8. Hypothyroidism. TSH within normal limits. 9. Severe malnutrition.  10. Obesity unspecified.   Appears frail but seems to be making improvement with treatment for pneumonia, COPD exacerbation, atrial fibrillation as per pulmonology and cardiology. Plan to continue current treatments as recommended by them. Completed antibiotics today.  Decrease steroids.  Start bowel regimen  Nystatin for  oral candidiasis   Can probably transfer out of the unit 2/18.  Code Status: DNR DVT prophylaxis: Lovenox Family Communication:  Disposition Plan: pending PT recommendations  Brendia Sacks, MD  [facility]  Pager [phone] If 7PM-7AM, please contact night-coverage at [email or web address], password [facility] 06/05/2014, 11:19 AM  LOS: 9 days   Consultants:  Pulmonology  Cardiology  Nephrology  Procedures:  2-D echocardiogram Impressions:  - Images are limited. Normal LV wall thickness with LVEF approximately 25-30%, septal dyssynergy noted with LBBB. Wall motion abnormalities also noted as outlined above. Indeterminate diastolic function. Mild mitral regurgitation. Trivial tricuspid regurgitation. Unable to assess PASP, but CVP elevated.  Antibiotics:  IV ROCEPHIN 05/27/2014>>>> 05/29/2014  IV AZITHROMYCIN 05/27/2014 >>>> 05/29/2014  Oral Augmentin 05/30/2014>>>> 05/31/2014  IV Levaquin 05/31/2014 >>2/17  HPI/Subjective: Reports sore tongue. Breathing okay. No other complaints.  Objective: Filed Vitals:   06/05/14 0700 06/05/14 0707 06/05/14 0739 06/05/14 0800  BP: 143/78   93/64  Pulse: 74   71  Temp:   97.5 F (36.4 C)   TempSrc:   Oral   Resp: 16   15  Height:      Weight:      SpO2: 90% 96%  94%    Intake/Output Summary (Last 24 hours) at 06/05/14 1119 Last data filed at 06/05/14 0900  Gross per 24 hour  Intake    240 ml  Output   2425 ml  Net  -2185 ml     Filed Weights   06/03/14 0500 06/04/14 0500 06/05/14 0500  Weight: 83.1 kg (183 lb 3.2 oz) 80.5 kg (177 lb 7.5 oz) 79.8 kg (175 lb 14.8 oz)    Exam:     Afebrile, vital signs stable. General:  Appears calm and comfortable, frail. Cardiovascular: Irregular, normal rate, no m/r/g. No LE edema. Respiratory: CTA bilaterally, no w/r/r. Normal respiratory effort. Musculoskeletal: grossly normal tone BUE/BLE Psychiatric: grossly normal mood and affect, speech fluent and  appropriate Neurologic: grossly non-focal.  Data Reviewed:  Urine output 2425  -  7 L since admission   Pertinent data:  Labs  Sodium nearly normalized on last check, 134, potassium was normal. The morning creatinine had improved as well, 29/1.14  CBC was unremarkable  Scheduled Meds: . antiseptic oral rinse  7 mL Mouth Rinse q12n4p  . aspirin EC  81 mg Oral Daily  . brinzolamide  1 drop Right Eye 2 times per day  . budesonide-formoterol  2 puff Inhalation BID  . chlorhexidine  15 mL Mouth Rinse BID  . diltiazem  240 mg Oral Daily  . enoxaparin (LOVENOX) injection  40 mg Subcutaneous Q24H  . feeding supplement (ENSURE COMPLETE)  237 mL Oral TID BM  . furosemide  20 mg Intravenous Daily  . guaiFENesin  1,200 mg Oral BID  . ipratropium  0.5 mg Nebulization Q6H  . latanoprost  1 drop Right Eye QHS  . levalbuterol  0.63 mg Nebulization Q6H  . levothyroxine  75 mcg Oral QAC breakfast  . loratadine  10 mg Oral Daily  . LORazepam  0.5 mg Oral QHS  . methylPREDNISolone (SOLU-MEDROL) injection  125 mg Intravenous 3 times per day  . pantoprazole  40 mg Oral Daily  . sodium chloride  500 mL Intravenous Once  . sodium chloride  10-40 mL Intracatheter Q12H   Continuous Infusions:   Principal Problem:   Acute respiratory failure Active Problems:   Hypothyroid   CKD (chronic kidney disease), stage III   Asthma, chronic   Hyponatremia   CAP (community acquired pneumonia)   Hypoxia   Atrial fibrillation with RVR   Cardiomyopathy   Acute exacerbation of CHF (congestive heart failure)   COPD exacerbation   Protein-calorie malnutrition, severe   Time spent 20 minutes

## 2014-06-06 LAB — BASIC METABOLIC PANEL
Anion gap: 6 (ref 5–15)
BUN: 37 mg/dL — ABNORMAL HIGH (ref 6–23)
CO2: 30 mmol/L (ref 19–32)
Calcium: 7.8 mg/dL — ABNORMAL LOW (ref 8.4–10.5)
Chloride: 91 mmol/L — ABNORMAL LOW (ref 96–112)
Creatinine, Ser: 1.15 mg/dL — ABNORMAL HIGH (ref 0.50–1.10)
GFR calc Af Amer: 46 mL/min — ABNORMAL LOW (ref >90)
GFR calc non Af Amer: 40 mL/min — ABNORMAL LOW (ref >90)
Glucose, Bld: 122 mg/dL — ABNORMAL HIGH (ref 70–99)
Potassium: 3.6 mmol/L (ref 3.5–5.1)
Sodium: 127 mmol/L — ABNORMAL LOW (ref 135–145)

## 2014-06-06 LAB — MAGNESIUM: Magnesium: 1.5 mg/dL (ref 1.5–2.5)

## 2014-06-06 MED ORDER — POTASSIUM CHLORIDE CRYS ER 20 MEQ PO TBCR
20.0000 meq | EXTENDED_RELEASE_TABLET | Freq: Every day | ORAL | Status: DC
  Administered 2014-06-06 – 2014-06-08 (×3): 20 meq via ORAL
  Filled 2014-06-06 (×3): qty 1

## 2014-06-06 MED ORDER — MAGNESIUM SULFATE 2 GM/50ML IV SOLN
2.0000 g | Freq: Once | INTRAVENOUS | Status: AC
  Administered 2014-06-06: 2 g via INTRAVENOUS
  Filled 2014-06-06: qty 50

## 2014-06-06 MED ORDER — METHYLPREDNISOLONE SODIUM SUCC 125 MG IJ SOLR
80.0000 mg | Freq: Two times a day (BID) | INTRAMUSCULAR | Status: DC
  Administered 2014-06-06 – 2014-06-07 (×3): 80 mg via INTRAVENOUS
  Filled 2014-06-06 (×3): qty 2

## 2014-06-06 MED ORDER — FUROSEMIDE 20 MG PO TABS
20.0000 mg | ORAL_TABLET | Freq: Every day | ORAL | Status: DC
  Administered 2014-06-06 – 2014-06-08 (×3): 20 mg via ORAL
  Filled 2014-06-06 (×3): qty 1

## 2014-06-06 MED ORDER — GUAIFENESIN-DM 100-10 MG/5ML PO SYRP: 5.0000 mL | ORAL_SOLUTION | ORAL | Status: DC | PRN

## 2014-06-06 MED ORDER — FUROSEMIDE 40 MG PO TABS: 40.0000 mg | ORAL_TABLET | Freq: Every day | ORAL | Status: DC

## 2014-06-06 MED ORDER — MAGNESIUM OXIDE 400 (241.3 MG) MG PO TABS
400.0000 mg | ORAL_TABLET | Freq: Every day | ORAL | Status: DC
Start: 2014-06-07 — End: 2014-06-08
  Administered 2014-06-07 – 2014-06-08 (×2): 400 mg via ORAL
  Filled 2014-06-06 (×2): qty 1

## 2014-06-06 NOTE — Progress Notes (Signed)
Patient refused to go on BIPAP for the night. Talked with patient and sitter, told them to call if she changed her mind and that we would need to put it on if her breathing became bad.  O2 sat 94% on 3 lpm at this time and in no distress. BIPAP at bedside if needed.

## 2014-06-06 NOTE — Progress Notes (Signed)
Pt tranferring to room 313. Pt alert and oriented.o2 at 4l/min via [place]. o2 sat 96% Rt picc triple lumen patent and nsl.foley cath patent.  Family present at bedside. tranfser report called to karen rn on [phone].

## 2014-06-06 NOTE — Evaluation (Signed)
Physical Therapy Evaluation Patient Details Name: Daisy Scott MRN: [id number] DOB: 10/01/1918 Today's Date: 06/06/2014   History of Present Illness  Daisy Scott is a 81 y.o. female with prior h/o hypertension, CKD comes inf or cough with productive sputum and congestion for one week. She also reports some chills, no fevers. She was seen Dr Kathi Der office and was sent to the hospital for admission for evaluation and management of CAP. cxr showed some bilateral effusions and mild congestion.  ON arrival to the floor, pt was wheezing bilaterally. Her labs revealed baseline CKD. She was admitted to medical service for management of CAP AND ASTHMA EXACERBATION. Pt was seen last week by PT and found to be at functional baseline.  Since that time she has had a decline in respiratory status and is now in the ICU.  She is being seen for a reevaluation.     Clinical Impression  Pt was found to be quite lethargic in bed and needed urging to convince her to work with [place].  She was able to participate in therapeutic exercise in supine, demonstrating 3/5 strength in LEs.  Functional strength however is only 2/5 due to significant deconditioning.  She was cooperative and able to follow all directions.  Min assist needed to sit at EOB and max assist needed to stand with a walker.  Pt unable to maintain stance and ultimately needed max assist to transfer bed to chair with 1 person assist.  I am now recommending SNF at d/c.  Pt understands and is agreeable.    Follow Up Recommendations SNF    Equipment Recommendations  None recommended by PT    Recommendations for Other Services   OT    Precautions / Restrictions Precautions Precautions: Fall Restrictions Weight Bearing Restrictions: No      Mobility  Bed Mobility Overal bed mobility: Needs Assistance Bed Mobility: Supine to Sit     Supine to sit: Min assist;HOB elevated        Transfers Overall transfer level: Needs  assistance Equipment used: Rolling walker (2 wheeled) Transfers: Sit to/from Stand Sit to Stand: Max assist         General transfer comment: pt unable to maintain stance for more than a few seconds  Ambulation/Gait Ambulation/Gait assistance:  (unable to ambulate)              Stairs            Wheelchair Mobility    Modified Rankin (Stroke Patients Only)       Balance Overall balance assessment: No apparent balance deficits (not formally assessed)                                           Pertinent Vitals/Pain Pain Assessment: No/denies pain    Home Living Family/patient expects to be discharged to:: Skilled nursing facility Living Arrangements: Alone Available Help at Discharge: Family;Available 24 hours/day Type of Home: House Home Access: Level entry     Home Layout: Multi-level Home Equipment: Walker - 4 wheels;Walker - 2 wheels;Cane - single point;Bedside commode;Shower seat - built in;Grab bars - toilet;Grab bars - tub/shower;Wheelchair - manual      Prior Function Level of Independence: Independent with assistive device(s);Needs assistance   Gait / Transfers Assistance Needed: Per family, pt was mod (I) with bed mobility skills, transfers, and gait in household.   ADL's / Homemaking Assistance Needed:  Pt family, family completes cooking and laundry.   Comments: Pt does have a W/C for longer distances, which she does not self propel     Hand Dominance        Extremity/Trunk Assessment               Lower Extremity Assessment: Generalized weakness         Communication   Communication: No difficulties  Cognition Arousal/Alertness: Lethargic Behavior During Therapy: WFL for tasks assessed/performed Overall Cognitive Status: Within Functional Limits for tasks assessed                      General Comments      Exercises General Exercises - Lower Extremity Ankle Circles/Pumps: AROM;Both;10  reps;Supine Quad Sets: AROM;Both;10 reps;Supine Gluteal Sets: AROM;Both;10 reps Short Arc Quad: AROM;Both;10 reps;Supine Heel Slides: AROM;Both;10 reps Hip ABduction/ADduction: AROM;Both;10 reps;Supine      Assessment/Plan    PT Assessment Patient needs continued PT services  PT Diagnosis Difficulty walking;Generalized weakness   PT Problem List Decreased strength;Decreased activity tolerance;Decreased mobility;Cardiopulmonary status limiting activity  PT Treatment Interventions Gait training;Functional mobility training;Therapeutic exercise   PT Goals (Current goals can be found in the Care Plan section) Acute Rehab PT Goals Patient Stated Goal: none stated PT Goal Formulation: With patient Time For Goal Achievement: 06/20/14 Potential to Achieve Goals: Good    Frequency Min 3X/week   Barriers to discharge   none    Co-evaluation               End of Session Equipment Utilized During Treatment: Gait belt;Oxygen Activity Tolerance: Patient limited by fatigue Patient left: in chair;with call bell/phone within reach;with nursing/sitter in room Nurse Communication: Mobility status         Time: [id number] PT Time Calculation (min) (ACUTE ONLY): 39 min   Charges:   PT Evaluation $PT Re-evaluation: 1 Procedure     PT G Codes:        Mandeep Kiser L 06/06/2014, 11:02 AM

## 2014-06-06 NOTE — Progress Notes (Signed)
Subjective: She says she feels better. She wears BiPAP overnight and then takes it off and does pretty well during the day. She has no new complaints. She is still coughing mostly nonproductively  Objective: Vital signs in last 24 hours: Temp:  [97.4 F (36.3 C)-98.1 F (36.7 C)] 97.4 F (36.3 C) (02/18 0400) Pulse Rate:  [48-90] 66 (02/18 0600) Resp:  [13-23] 15 (02/18 0600) BP: (81-126)/(35-95) 126/52 mmHg (02/18 0600) SpO2:  [93 %-96 %] 94 % (02/18 0713) FiO2 (%):  [30 %] 30 % (02/18 0342) Weight:  [81.5 kg (179 lb 10.8 oz)] 81.5 kg (179 lb 10.8 oz) (02/18 0500) Weight change: 1.7 kg (3 lb 12 oz) Last BM Date: 05/28/14  Intake/Output from previous day: 02/17 0701 - 02/18 0700 In: 1197 [P.O.:1197] Out: 2100 [Urine:2100]  PHYSICAL EXAM General appearance: alert, cooperative and mild distress Resp: rales bilaterally and rhonchi bilaterally Cardio: regular rate and rhythm, S1, S2 normal, no murmur, click, rub or gallop GI: soft, non-tender; bowel sounds normal; no masses,  no organomegaly Extremities: extremities normal, atraumatic, no cyanosis or edema  Lab Results:  Results for orders placed or performed during the hospital encounter of 05/27/14 (from the past 48 hour(s))  CBC     Status: None   Collection Time: 06/04/14  8:33 AM  Result Value Ref Range   WBC 8.0 4.0 - 10.5 K/uL   RBC 3.97 3.87 - 5.11 MIL/uL   Hemoglobin 12.2 12.0 - 15.0 g/dL   HCT 36.3 36.0 - 46.0 %   MCV 91.4 78.0 - 100.0 fL   MCH 30.7 26.0 - 34.0 pg   MCHC 33.6 30.0 - 36.0 g/dL   RDW 15.1 11.5 - 15.5 %   Platelets 299 150 - 400 K/uL    Comment: DELTA CHECK NOTED  Basic metabolic panel     Status: Abnormal   Collection Time: 06/04/14  8:33 AM  Result Value Ref Range   Sodium 134 (L) 135 - 145 mmol/L   Potassium 3.5 3.5 - 5.1 mmol/L    Comment: DELTA CHECK NOTED   Chloride 98 96 - 112 mmol/L   CO2 30 19 - 32 mmol/L   Glucose, Bld 122 (H) 70 - 99 mg/dL   BUN 29 (H) 6 - 23 mg/dL   Creatinine,  Ser 1.14 (H) 0.50 - 1.10 mg/dL   Calcium 8.1 (L) 8.4 - 10.5 mg/dL   GFR calc non Af Amer 40 (L) >90 mL/min   GFR calc Af Amer 47 (L) >90 mL/min    Comment: (NOTE) The eGFR has been calculated using the CKD EPI equation. This calculation has not been validated in all clinical situations. eGFR's persistently <90 mL/min signify possible Chronic Kidney Disease.    Anion gap 6 5 - 15  Blood gas, arterial     Status: Abnormal   Collection Time: 06/04/14 10:38 AM  Result Value Ref Range   FIO2 30.00 %   Delivery systems BILEVEL POSITIVE AIRWAY PRESSURE    Mode BILEVEL POSITIVE AIRWAY PRESSURE    Inspiratory PAP 14    Expiratory PAP 7    pH, Arterial 7.478 (H) 7.350 - 7.450   pCO2 arterial 41.0 35.0 - 45.0 mmHg   pO2, Arterial 79.8 (L) 80.0 - 100.0 mmHg   Bicarbonate 30.0 (H) 20.0 - 24.0 mEq/L   TCO2 26.3 0 - 100 mmol/L   Acid-Base Excess 6.3 (H) 0.0 - 2.0 mmol/L   O2 Saturation 96.0 %   Patient temperature 37.0    Collection site  LEFT BRACHIAL    Drawn by [id number]    Sample type ARTERIAL    Allens test (pass/fail) PASS PASS  Albumin     Status: Abnormal   Collection Time: 06/05/14  4:23 AM  Result Value Ref Range   Albumin 2.9 (L) 3.5 - 5.2 g/dL  Basic metabolic panel     Status: Abnormal   Collection Time: 06/06/14  5:09 AM  Result Value Ref Range   Sodium 127 (L) 135 - 145 mmol/L    Comment: DELTA CHECK NOTED   Potassium 3.6 3.5 - 5.1 mmol/L   Chloride 91 (L) 96 - 112 mmol/L   CO2 30 19 - 32 mmol/L   Glucose, Bld 122 (H) 70 - 99 mg/dL   BUN 37 (H) 6 - 23 mg/dL   Creatinine, Ser 1.15 (H) 0.50 - 1.10 mg/dL   Calcium 7.8 (L) 8.4 - 10.5 mg/dL   GFR calc non Af Amer 40 (L) >90 mL/min   GFR calc Af Amer 46 (L) >90 mL/min    Comment: (NOTE) The eGFR has been calculated using the CKD EPI equation. This calculation has not been validated in all clinical situations. eGFR's persistently <90 mL/min signify possible Chronic Kidney Disease.    Anion gap 6 5 - 15  Magnesium      Status: None   Collection Time: 06/06/14  5:09 AM  Result Value Ref Range   Magnesium 1.5 1.5 - 2.5 mg/dL    ABGS  Recent Labs  06/04/14 1038  PHART 7.478*  PO2ART 79.8*  TCO2 26.3  HCO3 30.0*   CULTURES Recent Results (from the past 240 hour(s))  Culture, expectorated sputum-assessment     Status: None   Collection Time: 05/27/14  8:49 PM  Result Value Ref Range Status   Specimen Description SPUTUM  Final   Special Requests NONE  Final   Sputum evaluation   Final    THIS SPECIMEN IS ACCEPTABLE. RESPIRATORY CULTURE REPORT TO FOLLOW. PERFORMED AT [facility]    Report Status 05/27/2014 FINAL  Final  Culture, respiratory (NON-Expectorated)     Status: None   Collection Time: 05/27/14  8:49 PM  Result Value Ref Range Status   Specimen Description SPUTUM EXPECTORATED  Final   Special Requests NONE  Final   Gram Stain   Final    FEW WBC PRESENT,BOTH PMN AND MONONUCLEAR FEW SQUAMOUS EPITHELIAL CELLS PRESENT FEW GRAM POSITIVE COCCI IN PAIRS RARE GRAM NEGATIVE RODS Performed at [facility]    Culture   Final    NORMAL OROPHARYNGEAL FLORA Performed at [facility]    Report Status 05/30/2014 FINAL  Final  Culture, Urine     Status: None   Collection Time: 05/27/14  8:50 PM  Result Value Ref Range Status   Specimen Description URINE, CLEAN CATCH  Final   Special Requests NONE  Final   Colony Count NO GROWTH Performed at [facility]   Final   Culture NO GROWTH Performed at [facility]   Final   Report Status 05/29/2014 FINAL  Final  Rapid strep screen     Status: None   Collection Time: 05/28/14 12:25 PM  Result Value Ref Range Status   Streptococcus, Group A Screen (Direct) NEGATIVE NEGATIVE Final    Comment: (NOTE) A Rapid Antigen test may result negative if the antigen level in the sample is below the detection level of this test. The FDA has not cleared this test as a stand-alone test therefore the rapid antigen  negative result has  reflexed to a Group A Strep culture.   Culture, Group A Strep     Status: None   Collection Time: 05/28/14 12:25 PM  Result Value Ref Range Status   Specimen Description THROAT  Final   Special Requests NONE  Final   Culture   Final    No Beta Hemolytic Streptococci Isolated Performed at [facility]    Report Status 05/30/2014 FINAL  Final  Culture, Urine     Status: None   Collection Time: 05/29/14  5:46 PM  Result Value Ref Range Status   Specimen Description URINE, CLEAN CATCH  Final   Special Requests NONE  Final   Colony Count NO GROWTH Performed at [facility]   Final   Culture NO GROWTH Performed at [facility]   Final   Report Status 05/31/2014 FINAL  Final  MRSA PCR Screening     Status: None   Collection Time: 05/31/14 10:04 AM  Result Value Ref Range Status   MRSA by PCR NEGATIVE NEGATIVE Final    Comment:        The GeneXpert MRSA Assay (FDA approved for NASAL specimens only), is one component of a comprehensive MRSA colonization surveillance program. It is not intended to diagnose MRSA infection nor to guide or monitor treatment for MRSA infections.    Studies/Results: No results found.  Medications:  Prior to Admission:  Prescriptions prior to admission  Medication Sig Dispense Refill Last Dose  . albuterol (PROVENTIL HFA;VENTOLIN HFA) 108 (90 BASE) MCG/ACT inhaler Inhale 2 puffs into the lungs every 6 (six) hours as needed for wheezing. 1 Inhaler 2 05/27/2014 at Unknown time  . allopurinol (ZYLOPRIM) 100 MG tablet TAKE TWO TABLETS BY MOUTH DAILY (Patient taking differently: Take 100 mg by mouth daily. ) 60 tablet 6 05/27/2014 at Unknown time  . amLODipine (NORVASC) 5 MG tablet TAKE ONE (1) TABLET EACH DAY 30 tablet 4 05/27/2014 at Unknown time  . aspirin EC 81 MG tablet Take 81 mg by mouth daily.   05/27/2014 at Unknown time  . azithromycin (ZITHROMAX) 250 MG tablet As directed (Patient taking differently: Take 250-500 mg by mouth  daily. Take 2 tabs on day 1, then 1 tab daily until finished.  Starting 05/24/2014 x 5 days.) 6 tablet 0 05/27/2014 at Unknown time  . brinzolamide (AZOPT) 1 % ophthalmic suspension Place 1 drop into the right eye See admin instructions. Uses at 0800 and 1200.   05/27/2014 at Unknown time  . Cranberry 500 MG CAPS Take 1 capsule by mouth daily.   05/27/2014 at Unknown time  . levothyroxine (SYNTHROID, LEVOTHROID) 75 MCG tablet TAKE ONE TABLET EVERY MORNING (Patient taking differently: Take 75 mcg by mouth daily before breakfast. ) 30 tablet 5 05/27/2014 at Unknown time  . loratadine (CLARITIN) 10 MG tablet Take 10 mg by mouth daily.   05/27/2014 at Unknown time  . LORazepam (ATIVAN) 0.5 MG tablet Take 1 tablet (0.5 mg total) by mouth 2 (two) times daily. As directed 30 tablet 5 05/27/2014 at Unknown time  . Multiple Vitamin (MULTIVITAMIN WITH MINERALS) TABS tablet Take 1 tablet by mouth daily.   05/27/2014 at Unknown time  . pantoprazole (PROTONIX) 40 MG tablet Take 1 tablet (40 mg total) by mouth daily. 30 tablet 5 05/27/2014 at Unknown time  . salmeterol (SEREVENT DISKUS) 50 MCG/DOSE diskus inhaler USE 1 INHALTAION TWICE DAILY (Patient taking differently: Inhale 1 puff into the lungs 2 (two) times daily. ) 60  g 5 05/27/2014 at Unknown time  . sertraline (ZOLOFT) 50 MG tablet TAKE 1/2 TABLET DAILY (Patient taking differently: Take 25 mg by mouth daily. ) 15 tablet 5 05/27/2014 at Unknown time  . theophylline (THEODUR) 300 MG 12 hr tablet Take 0.5 tablets (150 mg total) by mouth 2 (two) times daily. 1/2 tablet BID 60 tablet 5 05/27/2014 at Unknown time  . travoprost, benzalkonium, (TRAVATAN) 0.004 % ophthalmic solution Place 1 drop into the right eye at bedtime.   05/26/2014 at Unknown time   Scheduled: . antiseptic oral rinse  7 mL Mouth Rinse q12n4p  . aspirin EC  81 mg Oral Daily  . brinzolamide  1 drop Right Eye 2 times per day  . budesonide-formoterol  2 puff Inhalation BID  . chlorhexidine  15 mL Mouth Rinse BID  .  diltiazem  240 mg Oral Daily  . enoxaparin (LOVENOX) injection  40 mg Subcutaneous Q24H  . feeding supplement (ENSURE COMPLETE)  237 mL Oral TID BM  . guaiFENesin  1,200 mg Oral BID  . ipratropium  0.5 mg Nebulization Q6H  . latanoprost  1 drop Right Eye QHS  . levalbuterol  0.63 mg Nebulization Q6H  . levothyroxine  75 mcg Oral QAC breakfast  . loratadine  10 mg Oral Daily  . LORazepam  0.5 mg Oral QHS  . methylPREDNISolone (SOLU-MEDROL) injection  80 mg Intravenous 3 times per day  . nystatin  5 mL Oral QID  . pantoprazole  40 mg Oral Daily  . polyethylene glycol  17 g Oral BID  . sodium chloride  10-40 mL Intracatheter Q12H   Continuous:  [id number] **OR** acetaminophen, benzonatate, guaiFENesin-dextromethorphan, hydrALAZINE, ipratropium-albuterol, levalbuterol, menthol-cetylpyridinium, ondansetron **OR** ondansetron (ZOFRAN) IV, sodium chloride  Assesment: She was admitted with community-acquired pneumonia and respiratory failure. She was initially treated and seemed to be improving then became much worse and had to be placed on BiPAP. This appears to be a combination of multiple factors including pneumonia, COPD exacerbation and exacerbation of congestive heart failure. She also had atrial fibrillation with rapid ventricular response. She has been on BiPAP at night and has improved. Principal Problem:   Acute respiratory failure Active Problems:   Hypothyroid   CKD (chronic kidney disease), stage III   Asthma, chronic   Hyponatremia   CAP (community acquired pneumonia)   Hypoxia   Atrial fibrillation with RVR   Cardiomyopathy   Acute exacerbation of CHF (congestive heart failure)   COPD exacerbation   Protein-calorie malnutrition, severe   Severe left ventricular systolic dysfunction    Plan: Continue current treatments. No changes in medications at this point. Continue BiPAP as needed.    LOS: 10 days   Verdene Creson L 06/06/2014, 7:29 AM

## 2014-06-06 NOTE — Progress Notes (Signed)
PROGRESS NOTE  Daisy Scott WUJ:[id number] DOB: [birth year] DOA: 05/27/2014 PCP: Rudi Heap, MD  Summary: 81 year old woman presented with shortness of breath, treated for bronchospasm, developed rapid atrial fibrillation, seen by cardiology, declined anticoagulation. Developed multifactorial respiratory failure including COPD exacerbation, acute systolic congestive heart failure, pneumonia. Treated with BiPAP. Diuresed well with Lasix. Still requires BiPAP at night. Followed by nephrology for hyponatremia.  Assessment/Plan: 1. Acute respiratory failure: Much improved. Likely multifactorial including COPD exacerbation, community-acquired pneumonia, complicated by heart failure and atrial fibrillation. VQ scan negative for pulmonary embolism. 2. Presumed community acquired pneumonia, continues to improve. Has completed treatment with antibiotics. . 3. Acute COPD exacerbation. Appears resolved. 4. Atrial fibrillation with RVR. Thought secondary to underlying pulmonology issues. CHADS2 = 5. Family declines anticoagulation. TSH normal. 2-D echocardiogram revealed systolic dysfunction with diffuse hypokinesis. Lopressor changed to Cardizem because of wheezing. 5. Acute systolic congestive heart failure, suspected ischemic cardiomyopathy. LVEF 25-30% with chronic LBBB. Initially treated with beta blocker which worsened wheezing, no ACE-I secondary to AKI. Appears stable at this time. 6. Elevated troponin secondary to demand ischemia. 7. Hyponatremia suspect CHF. Labile. Followed by nephrology. Treated with fluid restriction, Lasix and fluids. 8. AKI superimposed on CKD stage III better than baseline though BUN higher. 9. Hypothyroidism. TSH within normal limits. 10. Severe malnutrition.  11. Obesity unspecified.   Continues to slowly improve although her long-term prognosis is poor.  She has completed antibiotic therapy and her respiratory status does appear to be stable. COPD appears to be at  baseline at this point. Agree with oral diuretics at this time.  Await further recommendations from nephrology in regard to hyponatremia.  Plan transfer to telemetry today.  Possible transfer to skilled nursing facility next 48 hours  Code Status: DNR DVT prophylaxis: Lovenox Family Communication:  Disposition Plan: SNF  Brendia Sacks, MD  [facility]  Pager [phone] If 7PM-7AM, please contact night-coverage at [email or web address], password [facility] 06/06/2014, 9:53 AM  LOS: 10 days   Consultants:  Pulmonology  Cardiology  Nephrology  PT: SNF  Procedures:  2-D echocardiogram Impressions:  - Images are limited. Normal LV wall thickness with LVEF approximately 25-30%, septal dyssynergy noted with LBBB. Wall motion abnormalities also noted as outlined above. Indeterminate diastolic function. Mild mitral regurgitation. Trivial tricuspid regurgitation. Unable to assess PASP, but CVP elevated.  Antibiotics:  IV ROCEPHIN 05/27/2014>>>> 05/29/2014  IV AZITHROMYCIN 05/27/2014 >>>> 05/29/2014  Oral Augmentin 05/30/2014>>>> 05/31/2014  IV Levaquin 05/31/2014 >>2/17  HPI/Subjective: Cardiology started oral Lasix.   Overall she is feeling okay. No complaints at this time.  Objective: Filed Vitals:   06/06/14 0600 06/06/14 0700 06/06/14 0713 06/06/14 0800  BP: 126/52 114/75  126/90  Pulse: 66 66  70  Temp:   97.2 F (36.2 C)   TempSrc:      Resp: Height:      Weight:      SpO2: 95% 96% 94% 96%    Intake/Output Summary (Last 24 hours) at 06/06/14 0953 Last data filed at 06/06/14 0800  Gross per 24 hour  Intake   1197 ml  Output   2100 ml  Net   -903 ml     Filed Weights   06/04/14 0500 06/05/14 0500 06/06/14 0500  Weight: 80.5 kg (177 lb 7.5 oz) 79.8 kg (175 lb 14.8 oz) 81.5 kg (179 lb 10.8 oz)    Exam:     Afebrile, VSS. General:  Appears comfortable, calm. Cardiovascular: Irregular, normal rate, no murmur, rub or gallop.  No  lower extremity edema. Respiratory: Clear to auscultation bilaterally, no wheezes, rales or rhonchi. Normal respiratory effort. Musculoskeletal: grossly normal tone bilateral upper and lower extremities Psychiatric: grossly normal mood and affect, speech fluent and appropriate Neurologic: grossly non-focal.  Data Reviewed:  Urine output 2100  -8 L since admission   Na+ labile, 127, chloride low 91, BUN higher but creatinine statble.  Pertinent data:  Labs    Scheduled Meds: . antiseptic oral rinse  7 mL Mouth Rinse q12n4p  . aspirin EC  81 mg Oral Daily  . brinzolamide  1 drop Right Eye 2 times per day  . budesonide-formoterol  2 puff Inhalation BID  . chlorhexidine  15 mL Mouth Rinse BID  . diltiazem  240 mg Oral Daily  . enoxaparin (LOVENOX) injection  40 mg Subcutaneous Q24H  . feeding supplement (ENSURE COMPLETE)  237 mL Oral TID BM  . furosemide  40 mg Oral Daily  . guaiFENesin  1,200 mg Oral BID  . ipratropium  0.5 mg Nebulization Q6H  . latanoprost  1 drop Right Eye QHS  . levalbuterol  0.63 mg Nebulization Q6H  . levothyroxine  75 mcg Oral QAC breakfast  . loratadine  10 mg Oral Daily  . LORazepam  0.5 mg Oral QHS  . [START ON 06/07/2014] magnesium oxide  400 mg Oral Daily  . magnesium sulfate 1 - 4 g bolus IVPB  2 g Intravenous Once  . methylPREDNISolone (SOLU-MEDROL) injection  80 mg Intravenous 3 times per day  . nystatin  5 mL Oral QID  . pantoprazole  40 mg Oral Daily  . polyethylene glycol  17 g Oral BID  . potassium chloride  20 mEq Oral Daily  . sodium chloride  10-40 mL Intracatheter Q12H   Continuous Infusions:   Principal Problem:   Acute respiratory failure Active Problems:   Hypothyroid   CKD (chronic kidney disease), stage III   Asthma, chronic   Hyponatremia   CAP (community acquired pneumonia)   Hypoxia   Atrial fibrillation with RVR   Cardiomyopathy   Acute exacerbation of CHF (congestive heart failure)   COPD exacerbation    Protein-calorie malnutrition, severe   Severe left ventricular systolic dysfunction   Time spent 25 minutes

## 2014-06-06 NOTE — Progress Notes (Signed)
Consulting cardiologist: Prentice DockerKoneswaran, Suresh MD Primary Cardiologist:   Cardiology Specific Problem List: 1. Atrial fib with RVR 2. Acute Systolic CHF EF of 25%-30%   Subjective:    Complains of tongue burning and painful swallowing. Continues frequent coughing.  Objective:   Temp:  [97.2 F (36.2 C)-98.1 F (36.7 C)] 97.2 F (36.2 C) (02/18 0713) Pulse Rate:  [48-90] 70 (02/18 0800) Resp:  [13-23] 15 (02/18 0800) BP: (81-126)/(35-95) 126/90 mmHg (02/18 0800) SpO2:  [93 %-96 %] 96 % (02/18 0800) FiO2 (%):  [30 %] 30 % (02/18 0342) Weight:  [179 lb 10.8 oz (81.5 kg)] 179 lb 10.8 oz (81.5 kg) (02/18 0500) Last BM Date: 05/28/14  Filed Weights   06/04/14 0500 06/05/14 0500 06/06/14 0500  Weight: 177 lb 7.5 oz (80.5 kg) 175 lb 14.8 oz (79.8 kg) 179 lb 10.8 oz (81.5 kg)    Intake/Output Summary (Last 24 hours) at 06/06/14 [id number] Last data filed at 06/06/14 0800  Gross per 24 hour  Intake   1197 ml  Output   2100 ml  Net   -903 ml    Telemetry: Atrial fib, rates in the 70' and 80's.   Exam:  General: No acute distress.  HEENT: Conjunctiva and lids normal, oropharynx clear.  Lungs:Inspiratory wheezes with rales and frequent productive coughing.  Cardiac: Slightly elevated JVP or bruits. IRRR, no gallop or rub.   Abdomen: Normoactive bowel sounds, nontender, nondistended.  Extremities: No pitting edema, distal pulses full.  Neuropsychiatric: Alert and oriented x3, affect appropriate.   Lab Results:  Basic Metabolic Panel:  Recent Labs Lab 06/01/14 0326 06/01/14 0921  06/03/14 0440 06/04/14 0833 06/06/14 0509  NA 119* 124*  < > 130* 134* 127*  K 4.1 4.6  < > 4.9 3.5 3.6  CL 91* 92*  < > 101 98 91*  CO2 19 21  < > 26 30 30   GLUCOSE 108* 128*  < > 133* 122* 122*  BUN 39* 39*  < > 28* 29* 37*  CREATININE 1.47* 1.44*  < > 1.05 1.14* 1.15*  CALCIUM 7.5* 7.9*  < > 6.7* 8.1* 7.8*  MG 1.8 1.8  --   --   --  1.5  < > = values in this interval not  displayed.  Liver Function Tests:  Recent Labs Lab 06/05/14 0423  ALBUMIN 2.9*    CBC:  Recent Labs Lab 06/02/14 0445 06/03/14 0440 06/04/14 0833  WBC 5.3 7.6 8.0  HGB 8.9* 10.2* 12.2  HCT 25.5* 29.3* 36.3  MCV 89.5 90.4 91.4  PLT 189 228 299   Radiology: CXR 06/03/2013 IMPRESSION: Persistent abnormal density in both lower lobes consistent with atelectasis and or pneumonia. No change.    Medications:   Scheduled Medications: . antiseptic oral rinse  7 mL Mouth Rinse q12n4p  . aspirin EC  81 mg Oral Daily  . brinzolamide  1 drop Right Eye 2 times per day  . budesonide-formoterol  2 puff Inhalation BID  . chlorhexidine  15 mL Mouth Rinse BID  . diltiazem  240 mg Oral Daily  . enoxaparin (LOVENOX) injection  40 mg Subcutaneous Q24H  . feeding supplement (ENSURE COMPLETE)  237 mL Oral TID BM  . guaiFENesin  1,200 mg Oral BID  . ipratropium  0.5 mg Nebulization Q6H  . latanoprost  1 drop Right Eye QHS  . levalbuterol  0.63 mg Nebulization Q6H  . levothyroxine  75 mcg Oral QAC breakfast  . loratadine  10 mg Oral Daily  .  LORazepam  0.5 mg Oral QHS  . methylPREDNISolone (SOLU-MEDROL) injection  80 mg Intravenous 3 times per day  . nystatin  5 mL Oral QID  . pantoprazole  40 mg Oral Daily  . polyethylene glycol  17 g Oral BID  . sodium chloride  10-40 mL Intracatheter Q12H      PRN Medications: acetaminophen **OR** acetaminophen, benzonatate, guaiFENesin-dextromethorphan, hydrALAZINE, ipratropium-albuterol, levalbuterol, menthol-cetylpyridinium, ondansetron **OR** ondansetron (ZOFRAN) IV, sodium chloride   Assessment and Plan:   1.Atrial fib: Heart rate is currently controlled. CHADS VASC Score of 5. No anticoagulation per family as noted 06/05/2014.Remains on diltiazem 240 mg daily, ASA. No complaints of chest pain.  2. Systolic CHF: Clinically improved, no overt edema. Lung sounds remain harsh. Creatinine 1.15 this am compared to 1.16 on 06/04/2014. Na is lower  though. CO2 30. Would restart lasix at 40 mg po daily. Magnesium is 1.5. Will give supplements. Also give potassium supplements daily. Family making decision on DNR status.  Consider low dose spironolactone as renal function is improved. She has diuresed 7.8 liters since admission.   3. Acute Respiratory Distress: COPD exacerbation and pneumonia being treated with steroids and abx therapy per Dr. Juanetta Gosling.   Overall patient is improving slowly. No decision on DNR status.  Bettey Mare. Lawrence NP [facility]  06/06/2014, 9:22 AM   The patient was seen and examined, and I agree with the assessment and plan as documented above, with modifications as noted below. Pt is improving from a cardiopulmonary standpoint but overall prognosis is poor due to multiple comorbidities including severe malnutrition, severe left ventricular dysfunction, and advanced age. She says she feels better today. Family has requested to withhold anticoagulation altogether which I confirmed with her son on 2/17. Continue long-acting diltiazem for adequate rate control. No beta blockers due to precipitation of wheezing last week. No ACEI/ARB due to CKD. Will start Lasix at a lower dose of 20 mg daily with close monitoring of hemodynamics and renal function. Would give consideration for palliative care discussion.

## 2014-06-07 DIAGNOSIS — I5021 Acute systolic (congestive) heart failure: Secondary | ICD-10-CM

## 2014-06-07 LAB — BASIC METABOLIC PANEL
Anion gap: 10 (ref 5–15)
BUN: 42 mg/dL — AB (ref 6–23)
CALCIUM: 8.3 mg/dL — AB (ref 8.4–10.5)
CO2: 31 mmol/L (ref 19–32)
CREATININE: 1.28 mg/dL — AB (ref 0.50–1.10)
Chloride: 89 mmol/L — ABNORMAL LOW (ref 96–112)
GFR calc Af Amer: 40 mL/min — ABNORMAL LOW (ref >90)
GFR, EST NON AFRICAN AMERICAN: 35 mL/min — AB (ref >90)
Glucose, Bld: 124 mg/dL — ABNORMAL HIGH (ref 70–99)
Potassium: 4.3 mmol/L (ref 3.5–5.1)
Sodium: 130 mmol/L — ABNORMAL LOW (ref 135–145)

## 2014-06-07 MED ORDER — PREDNISONE 20 MG PO TABS
40.0000 mg | ORAL_TABLET | Freq: Every day | ORAL | Status: DC
  Administered 2014-06-08: 40 mg via ORAL
  Filled 2014-06-07: qty 2

## 2014-06-07 MED ORDER — ENOXAPARIN SODIUM 30 MG/0.3ML ~~LOC~~ SOLN
30.0000 mg | SUBCUTANEOUS | Status: DC
  Administered 2014-06-07: 30 mg via SUBCUTANEOUS
  Filled 2014-06-07: qty 0.3

## 2014-06-07 NOTE — Progress Notes (Signed)
She was admitted with pneumonia and developed more acute respiratory failure during hospitalization. This seems to be a combination of COPD, pneumonia, and acute congestive heart failure. She is remarkably better today. She did not wear her BiPAP last night and has done well. She is awake and alert and in no distress now  Exam shows that she is awake and alert. She looks very comfortable. Her temperature is 98.4, pulse 70, respirations 18, blood pressure 153/61 and oxygen saturation 95% on nasal oxygen. Her chest shows some rhonchi. Her heart is regular.  She is much improved. Continue with current treatments. She has improved markedly. She's been able to stay off BiPAP overnight.  Continue current treatments

## 2014-06-07 NOTE — Care Management Utilization Note (Signed)
CARE MANAGEMENT NOTE 06/07/2014  Patient:  Daisy Scott,Daisy   Account Number:  [id number]  Date Initiated:  05/28/2014  Documentation initiated by:  Kathyrn SheriffHILDRESS,Ella Golomb  Subjective/Objective Assessment:   Pt is admitted with pna. Pt is from home, lives alone but have 24/7 caregivers either by family or private duty aids. Pt has cane, rollator, wheelchair, bsc, shower chair at home. Pt has no med needs prior to admission.     Action/Plan:   Pt plans to dsicharge home with previous arrangment. PT eval pending. Will cont to follow for CM needs.   Anticipated DC Date:  24-Oct-2014   Anticipated DC Plan:  SKILLED NURSING FACILITY  In-house referral  Clinical Social Worker      DC Planning Services  CM consult      Choice offered to / List presented to:             Status of service:  In process, will continue to follow Medicare Important Message given?  YES (If response is "NO", the following Medicare IM given date fields will be blank) Date Medicare IM given:  05/31/2014 Medicare IM given by:  Kathyrn SheriffHILDRESS,Pearlina Friedly Date Additional Medicare IM given:  06/07/2014 Additional Medicare IM given by:  Kerith Sherley  Discharge Disposition:  SKILLED NURSING FACILITY  Per UR Regulation:  Reviewed for med. necessity/level of care/duration of stay  If discussed at Long Length of Stay Meetings, dates discussed:   06/04/2014  06/06/2014    Comments:  06/07/2014 0900 Kathyrn Sheriffjessica Julyanna Scholle, RN, MSN, CM Pt expected to discharge to SNF over weekend. No CM needs.  06/06/2014 1400 Kathyrn SheriffJessica Rachelann Enloe, RN, MSN, CM Per PT's recommendation pt will be discharged to SNF for rehab. CSW aware of discharge plan and will arrange for placement. Pt will be transfered to tele. Will continue to follow.  05/31/2014 1300 Kathyrn SheriffJessica Yaiza Palazzola, RN, MSN, CM Pt transferred to ICU and placed on BIPAP.  05/28/2014 1300 Juanetta BeetsJesica Meah Jiron, RN, MSN, CM

## 2014-06-07 NOTE — Clinical Social Work Placement (Signed)
Clinical Social Work Department CLINICAL SOCIAL WORK PLACEMENT NOTE 06/07/2014  Patient:  Daisy Scott,Daisy Scott  Account Number:  [id number] Admit date:  05/27/2014  Clinical Social Worker:  Tretha SciaraHEATHER SETTLE, LCSW  Date/time:  06/07/2014 12:53 PM  Clinical Social Work is seeking post-discharge placement for this patient at the following level of care:   SKILLED NURSING   (*CSW will update this form in Epic as items are completed)   06/07/2014  Patient/family provided with [facility] Department of Clinical Social Work's list of facilities offering this level of care within the geographic area requested by the patient (or if unable, by the patient's family).  06/07/2014  Patient/family informed of their freedom to choose among providers that offer the needed level of care, that participate in Medicare, Medicaid or managed care program needed by the patient, have an available bed and are willing to accept the patient.  06/07/2014  Patient/family informed of [facility]' ownership interest in [facility], as well as of the fact that they are under no obligation to receive care at this facility.  PASARR submitted to EDS on 06/07/2014 PASARR number received on 06/07/2014  FL2 transmitted to all facilities in geographic area requested by pt/family on  06/07/2014 FL2 transmitted to all facilities within larger geographic area on   Patient informed that his/her managed care company has contracts with or will negotiate with  certain facilities, including the following:     Patient/family informed of bed offers received:  06/07/2014 [facility] Physician recommends and patient chooses bed at    [facility] on   Patient to be transferred to facility by  Patient and family notified of transfer on  Name of family member notified:    The following physician request were entered in Epic:   Additional Comments:  Derenda FennelKara  Sandip Power, LCSW [phone]

## 2014-06-07 NOTE — Clinical Social Work Note (Signed)
Clinical Social Work Department BRIEF PSYCHOSOCIAL ASSESSMENT 06/07/2014  Patient:  Daisy Scott, Daisy Scott     Account Number:  [id number]     Admit date:  05/27/2014  Clinical Social Worker:  Legrand Como  Date/Time:  06/07/2014 12:55 PM  Referred by:  CSW  Date Referred:  06/07/2014 Referred for  SNF Placement   Other Referral:   Interview type:  Patient Other interview type:   Patient  Son, Daisy Scott    PSYCHOSOCIAL DATA Living Status:  ALONE Admitted from facility:   Level of care:   Primary support name:  Children Primary support relationship to patient:  CHILD, ADULT Degree of support available:   Family is very supportive.    CURRENT CONCERNS Current Concerns  Post-Acute Placement   Other Concerns:    SOCIAL WORK ASSESSMENT / PLAN CSW met with patient. Patient was oriented to self and place.  Patient indicated that she ambulates with a walker. She stated that she is able to complete ADL's unassisted. She indicated that she would go to a SNF based on the PT recommendation. Patient indicated that she did not have a facility preference.  Patient gave CSW permission to fax clinicals to facilities in this county. CSW advised that her placement would have to be authorized.  Patient stated that her children are very supportive.  Patient indicated that she was frustrated and "felt like shit." She indicated that her frustration was due to being on a free water restriction.  CSW provided supportive counseling to patient and motivational interviewing in a effort to assist patient in improving her outlook on her situation.   CSW spoke with patient's son, Daisy Scott, he indicated that patient resides alone, but patient's children provide 24/7 care to her through family and sitters.  He confirmed patient's statements.  He indicated that one of patient's daughter died and that patient had not been made aware of this.  He stated that he did not have a SNF preference. CSW spoke  to patient's daughter, Daisy Scott.  Daisy Scott indicated that her first choice in SNF would be [facility] followed by [facility].  She indicated that she did not desire patient to go to [facility].   Assessment/plan status:  Information/Referral to [employer] Other assessment/ plan:   Information/referral to community resources:    PATIENT'S/FAMILY'S RESPONSE TO PLAN OF CARE: Patient and family are agreeable. Patient's daughter want her to go to [facility] or [facility].   Daisy Scott, [place]

## 2014-06-07 NOTE — Clinical Social Work Placement (Signed)
Clinical Social Work Department CLINICAL SOCIAL WORK PLACEMENT NOTE 06/07/2014  Patient:  Daisy Scott,Daisy Scott  Account Number:  [id number] Admit date:  05/27/2014  Clinical Social Worker:  Tretha SciaraHEATHER Trinita Devlin, LCSW  Date/time:  06/07/2014 12:53 PM  Clinical Social Work is seeking post-discharge placement for this patient at the following level of care:   SKILLED NURSING   (*CSW will update this form in Epic as items are completed)   06/07/2014  Patient/family provided with [facility] Department of Clinical Social Work's list of facilities offering this level of care within the geographic area requested by the patient (or if unable, by the patient's family).  06/07/2014  Patient/family informed of their freedom to choose among providers that offer the needed level of care, that participate in Medicare, Medicaid or managed care program needed by the patient, have an available bed and are willing to accept the patient.  06/07/2014  Patient/family informed of [facility] ownership interest in [facility], as well as of the fact that they are under no obligation to receive care at this facility.  PASARR submitted to EDS on 06/07/2014 PASARR number received on 06/07/2014  FL2 transmitted to all facilities in geographic area requested by pt/family on  06/07/2014 FL2 transmitted to all facilities within larger geographic area on   Patient informed that his/her managed care company has contracts with or will negotiate with  certain facilities, including the following:     Patient/family informed of bed offers received:   Patient chooses bed at  Physician recommends and patient chooses bed at    Patient to be transferred to  on   Patient to be transferred to facility by  Patient and family notified of transfer on  Name of family member notified:    The following physician request were entered in Epic:   Additional Comments:  Tretha SciaraHeather Ethaniel Garfield, LCSW [phone]

## 2014-06-07 NOTE — Clinical Social Work Note (Signed)
CSW spoke with pt's daughter, Britta MccreedyBarbara at pt's request with bed offers. [facility]Bar[facility]. [facility] Anticipate d/c tomorrow.   Derenda FennelKara Daniesha Driver, [place]CS[place] [phone]

## 2014-06-07 NOTE — Progress Notes (Signed)
SUBJECTIVE: Pt doing much better. Son, Kathlene NovemberMike who is POA is also present along with daughter. [facility]. Now in sinus rhythm.     Intake/Output Summary (Last 24 hours) at 06/07/14 1626 Last data filed at 06/07/14 1431  Gross per 24 hour  Intake   1110 ml  Output   2150 ml  Net  -1040 ml    Current Facility-Administered Medications  Medication Dose Route Frequency Provider Last Rate Last Dose  . acetaminophen (TYLENOL) tablet 650 mg  650 mg Oral Q6H PRN Kathlen ModyVijaya Akula, MD   650 mg at 06/03/14 2138   Or  . acetaminophen (TYLENOL) suppository 650 mg  650 mg Rectal Q6H PRN Kathlen ModyVijaya Akula, MD      . antiseptic oral rinse (CPC / CETYLPYRIDINIUM CHLORIDE 0.05%) solution 7 mL  7 mL Mouth Rinse q12n4p Rodolph Bonganiel Thompson V, MD   7 mL at 06/07/14 1548  . aspirin EC tablet 81 mg  81 mg Oral Daily Kathlen ModyVijaya Akula, MD   81 mg at 06/07/14 [id number]  . benzonatate (TESSALON) capsule 100 mg  100 mg Oral BID PRN Kathlen ModyVijaya Akula, MD   100 mg at 06/06/14 0836  . brinzolamide (AZOPT) 1 % ophthalmic suspension 1 drop  1 drop Right Eye 2 times per day Kathlen ModyVijaya Akula, MD   1 drop at 06/07/14 1322  . budesonide-formoterol (SYMBICORT) 160-4.5 MCG/ACT inhaler 2 puff  2 puff Inhalation BID Kathlen ModyVijaya Akula, MD   2 puff at 06/07/14 0712  . chlorhexidine (PERIDEX) 0.12 % solution 15 mL  15 mL Mouth Rinse BID Rodolph Bonganiel Thompson V, MD   15 mL at 06/07/14 0936  . diltiazem (CARDIZEM CD) 24 hr capsule 240 mg  240 mg Oral Daily Jonelle SidleSamuel G McDowell, MD   240 mg at 06/07/14 [id number]  . enoxaparin (LOVENOX) injection 30 mg  30 mg Subcutaneous Q24H Standley Brookinganiel P Goodrich, MD      . feeding supplement (ENSURE COMPLETE) (ENSURE COMPLETE) liquid 237 mL  237 mL Oral TID BM Marshell LevanWanda L Weisner, RD   237 mL at 06/07/14 1322  . furosemide (LASIX) tablet 20 mg  20 mg Oral Daily Laqueta LindenSuresh A Delphin Funes, MD   20 mg at 06/07/14 [id number]  . guaiFENesin (MUCINEX) 12 hr tablet 1,200 mg  1,200 mg Oral BID Rodolph Bonganiel Thompson V, MD   1,200 mg at 06/07/14  0936  . guaiFENesin-dextromethorphan (ROBITUSSIN DM) 100-10 MG/5ML syrup 5 mL  5 mL Oral Q4H PRN Standley Brookinganiel P Goodrich, MD      . hydrALAZINE (APRESOLINE) tablet 25 mg  25 mg Oral Q6H PRN Meredeth IdeGagan S Lama, MD   25 mg at 06/04/14 0940  . ipratropium (ATROVENT) nebulizer solution 0.5 mg  0.5 mg Nebulization Q6H Erick BlinksJehanzeb Memon, MD   0.5 mg at 06/07/14 1437  . latanoprost (XALATAN) 0.005 % ophthalmic solution 1 drop  1 drop Right Eye QHS Kathlen ModyVijaya Akula, MD   1 drop at 06/06/14 2130  . levalbuterol (XOPENEX) nebulizer solution 0.63 mg  0.63 mg Nebulization Q2H PRN Rodolph Bonganiel Thompson V, MD   0.63 mg at 05/30/14 1954  . levalbuterol (XOPENEX) nebulizer solution 0.63 mg  0.63 mg Nebulization Q6H Erick BlinksJehanzeb Memon, MD   0.63 mg at 06/07/14 1437  . levothyroxine (SYNTHROID, LEVOTHROID) tablet 75 mcg  75 mcg Oral QAC breakfast Kathlen ModyVijaya Akula, MD   75 mcg at 06/07/14 0936  . loratadine (CLARITIN) tablet 10 mg  10 mg Oral Daily Kathlen ModyVijaya Akula, MD   10 mg at  06/07/14 9629  . LORazepam (ATIVAN) tablet 0.5 mg  0.5 mg Oral QHS Kathlen Mody, MD   0.5 mg at 06/06/14 2130  . magnesium oxide (MAG-OX) tablet 400 mg  400 mg Oral Daily Jodelle Gross, NP   400 mg at 06/07/14 5284  . menthol-cetylpyridinium (CEPACOL) lozenge 3 mg  1 lozenge Oral PRN Kathlen Mody, MD   3 mg at 05/29/14 1506  . methylPREDNISolone sodium succinate (SOLU-MEDROL) 125 mg/2 mL injection 80 mg  80 mg Intravenous Q12H Standley Brooking, MD   80 mg at 06/07/14 0516  . nystatin (MYCOSTATIN) 100000 UNIT/ML suspension 500,000 Units  5 mL Oral QID Standley Brooking, MD   500,000 Units at 06/07/14 1322  . ondansetron (ZOFRAN) tablet 4 mg  4 mg Oral Q6H PRN Kathlen Mody, MD       Or  . ondansetron (ZOFRAN) injection 4 mg  4 mg Intravenous Q6H PRN Kathlen Mody, MD   4 mg at 05/28/14 1824  . pantoprazole (PROTONIX) EC tablet 40 mg  40 mg Oral Daily Kathlen Mody, MD   40 mg at 06/07/14 0937  . polyethylene glycol (MIRALAX / GLYCOLAX) packet 17 g  17 g Oral BID Standley Brooking, MD   17 g at 06/07/14 [phone]  . potassium chloride SA (K-DUR,KLOR-CON) CR tablet 20 mEq  20 mEq Oral Daily Jodelle Gross, NP   20 mEq at 06/07/14 4010  . sodium chloride 0.9 % injection 10-40 mL  10-40 mL Intracatheter Q12H Rodolph Bong, MD   10 mL at 06/07/14 0515  . sodium chloride 0.9 % injection 10-40 mL  10-40 mL Intracatheter PRN Rodolph Bong, MD   10 mL at 06/07/14 0945    Filed Vitals:   06/07/14 0626 06/07/14 0710 06/07/14 1427 06/07/14 1437  BP: 153/61  141/91   Pulse: 70  65   Temp: 98.4 F (36.9 C)  97.9 F (36.6 C)   TempSrc: Oral  Oral   Resp: 18  20   Height:      Weight: 180 lb 12.4 oz (82 kg)     SpO2: 95% 95% 99% 91%    PHYSICAL EXAM General: NAD HEENT: Normal. Neck: No JVD, no thyromegaly.  Lungs: Faint expiratory rhonchi and wheezes, much less pronounced. CV: Regular rate and rhythm, normal S1/S2, no S3/S4, no murmur.  No pretibial edema.   Abdomen: Soft, no distention.  Neurologic: Alert and oriented.  Psych: Normal affect. Musculoskeletal: No gross deformities. Extremities: No clubbing or cyanosis.   TELEMETRY: Reviewed telemetry pt in sinus rhythm with PVC's.  LABS: Basic Metabolic Panel:  Recent Labs  27/25/36 0509 06/07/14 0608  NA 127* 130*  K 3.6 4.3  CL 91* 89*  CO2 30 31  GLUCOSE 122* 124*  BUN 37* 42*  CREATININE 1.15* 1.28*  CALCIUM 7.8* 8.3*  MG 1.5  --    Liver Function Tests:  Recent Labs  06/05/14 0423  ALBUMIN 2.9*   No results for input(s): LIPASE, AMYLASE in the last 72 hours. CBC: No results for input(s): WBC, NEUTROABS, HGB, HCT, MCV, PLT in the last 72 hours. Cardiac Enzymes: No results for input(s): CKTOTAL, CKMB, CKMBINDEX, TROPONINI in the last 72 hours. BNP: Invalid input(s): POCBNP D-Dimer: No results for input(s): DDIMER in the last 72 hours. Hemoglobin A1C: No results for input(s): HGBA1C in the last 72 hours. Fasting Lipid Panel: No results for input(s): CHOL, HDL, LDLCALC,  TRIG, CHOLHDL, LDLDIRECT in the last 72 hours. Thyroid Function  Tests: No results for input(s): TSH, T4TOTAL, T3FREE, THYROIDAB in the last 72 hours.  Invalid input(s): FREET3 Anemia Panel: No results for input(s): VITAMINB12, FOLATE, FERRITIN, TIBC, IRON, RETICCTPCT in the last 72 hours.  RADIOLOGY: Dg Chest 2 View  05/30/2014   CLINICAL DATA:  Cough and shortness of Breath progressing over the past 4 days.  EXAM: CHEST  2 VIEW  COMPARISON:  05/27/2014  FINDINGS: The heart is enlarged. There is tortuosity, ectasia and calcification of the thoracic aorta. New bibasilar densities suspicious for pneumonia. No pulmonary edema or pleural effusion.  IMPRESSION: Suspect new bibasilar infiltrates.   Electronically Signed   By: Rudie Meyer M.D.   On: 05/30/2014 15:52   Dg Chest 2 View  05/27/2014   ADDENDUM REPORT: 05/27/2014 11:31  ADDENDUM: In the body of the report it should read no acute bony abnormality. In the impression report issued. Small bilateral pleural effusions appear. Similar findings were noted on prior chest x-ray of 1 /15/ 2015.   Electronically Signed   By: Maisie Fus  Register   On: 05/27/2014 11:31   05/27/2014   CLINICAL DATA:  Congestion x1 week .  EXAM: CHEST  2 VIEW  COMPARISON:  None.  FINDINGS: Mediastinum and hilar structures are normal. Cardiomegaly with mild pulmonary vascular prominence. Bilateral small pleural effusions are noted. No focal infiltrate. No acute bony abnormality P  IMPRESSION: 1. Cardiomegaly with mild pulmonary vascular prominence. 2. Small bilateral pleural effusions appear  Electronically Signed: ByMaisie Fus  Register On: 05/27/2014 10:32   Nm Pulmonary Perf And Vent  05/29/2014   CLINICAL DATA:  Shortness of breath  EXAM: NUCLEAR MEDICINE VENTILATION - PERFUSION LUNG SCAN  TECHNIQUE: Ventilation images were obtained in multiple projections using inhaled aerosol technetium 99 M DTPA. Perfusion images were obtained in multiple projections after intravenous  injection of Tc-76m MAA.  RADIOPHARMACEUTICALS:  42 mCi Tc-61m DTPA aerosol and 6 mCi Tc-26m MAA  COMPARISON:  05/27/2014.  FINDINGS: Ventilation: Ventilation images show some patchy changes likely related to COPD. Central trapping of tracer is noted as well. No large ventilation defect is noted.  Perfusion: Overlying defect from the patient's arms is noted. No large segmental perfusion defect to suggest pulmonary embolism is identified. Changes consistent with a small pleural effusion seen on recent chest x-ray are noted.  IMPRESSION: Changes consistent with COPD in a right-sided pleural effusion.  No definitive pulmonary embolism is seen.   Electronically Signed   By: Alcide Clever M.D.   On: 05/29/2014 12:03   US Venous Img Lower Unilateral Right  05/28/2014   CLINICAL DATA:  Right leg swelling  EXAM: RIGHT LOWER EXTREMITY VENOUS DUPLEX ULTRASOUND  TECHNIQUE: Doppler venous assessment of the right lower extremity deep venous system was performed, including characterization of spectral flow, compressibility, and phasicity.  COMPARISON:  None.  FINDINGS: There is complete compressibility of the right common femoral, femoral, and popliteal veins. Doppler analysis demonstrates respiratory phasicity and augmentation of flow upon calf compression. No obvious calf vein thrombosis.  IMPRESSION: No evidence of right lower extremity DVT.   Electronically Signed   By: Jolaine Click M.D.   On: 05/28/2014 10:16   Dg Chest Port 1 View  06/03/2014   CLINICAL DATA:  Short of breath.  Followup abnormal lung density.  EXAM: PORTABLE CHEST - 1 VIEW  COMPARISON:  06/01/2014 and previous  FINDINGS: Right arm PICC has its tip in the SVC at the azygos level. Artifact overlies the chest elsewhere. The heart is enlarged. The aorta  is unfolded. Abnormal density persists in both lower lobes consistent with atelectasis or patchy pneumonia. No new finding.  IMPRESSION: Persistent abnormal density in both lower lobes consistent with  atelectasis and or pneumonia. No change.   Electronically Signed   By: Paulina Fusi M.D.   On: 06/03/2014 07:21   Dg Chest Port 1 View  06/01/2014   CLINICAL DATA:  PICC line placement. Acute respiratory failure, pneumonia, cough, CHF, COPD.  EXAM: PORTABLE CHEST - 1 VIEW  COMPARISON:  05/31/2014 and prior radiographs  FINDINGS: A right PICC line is now noted with tip overlying the mid SVC.  Cardiomegaly and pulmonary vascular congestion again noted.  Stable opacities overlying the mid and lower right lung again noted.  There is no evidence of pneumothorax.  Mild bibasilar atelectasis is present.  IMPRESSION: Right PICC line with tip overlying the mid SVC.  Otherwise unchanged chest radiograph.   Electronically Signed   By: Harmon Pier M.D.   On: 06/01/2014 13:53   Dg Chest Port 1 View  05/31/2014   CLINICAL DATA:  Shortness of breath.  EXAM: PORTABLE CHEST - 1 VIEW  COMPARISON:  05/30/2014 and 05/27/2014.  FINDINGS: There appears to be increased densities in the right hilum and medial right lung base. Heart size is upper limits of normal but minimally changed. Upper lungs appear to be clear.  IMPRESSION: Questionable densities in the right hilum and right lower lung region. Foci of airspace disease or infection cannot be excluded. Recommend continued follow-up.   Electronically Signed   By: Richarda Overlie M.D.   On: 05/31/2014 08:20      ASSESSMENT AND PLAN: 1. Atrial fibrillation: Has converted to sinus rhythm with PVC's. Continue long-acting diltiazem. Family has requested to withhold anticoagulation altogether which I confirmed with her son on 2/17. Continue ASA 81 mg. 2. Acute on chronic systolic heart failure with severe LV dysfunction: Currently euvolemic and now on oral diuretics. Continue Lasix 20 mg daily. GFR 40 ml/min. No beta blockers due to precipitation of wheezing last week. No ACEI/ARB due to CKD.    Can consider addition of low-dose spironolactone 12.5 mg in future. 3. Acute respiratory  failure, multifactorial in etiology, due to pneumonia, COPD, and CHF: Treatment as per pulmonary.  Dispo: I will arrange for outpatient follow up.  Prentice Docker, M.D., F.A.C.C.

## 2014-06-07 NOTE — Progress Notes (Signed)
PROGRESS NOTE  Quina Wilbourne OZH:[id number] DOB: 1918-05-05 DOA: 05/27/2014 PCP: Rudi Heap, MD  Summary: 81 year old woman presented with shortness of breath, treated for bronchospasm, developed rapid atrial fibrillation, seen by cardiology, declined anticoagulation. Developed multifactorial respiratory failure including COPD exacerbation, acute systolic congestive heart failure, pneumonia. Treated with BiPAP. Diuresed well with Lasix. Still requires BiPAP at night. Followed by nephrology for hyponatremia.  Assessment/Plan: 1. Acute respiratory failure: appears resolved. Likely multifactorial including COPD exacerbation, community-acquired pneumonia, complicated by heart failure and atrial fibrillation. VQ scan negative for pulmonary embolism. 2. Presumed community acquired pneumoniappears resolved. Has completed treatment with antibiotics. . 3. Acute COPD exacerbation. Appears resolved. 4. Atrial fibrillation with RVR. Thought secondary to underlying pulmonology issues. CHADS2 = 5. Family declines anticoagulation. TSH normal. 2-D echocardiogram revealed systolic dysfunction with diffuse hypokinesis. Lopressor changed to Cardizem because of wheezing. 5. Acute systolic congestive heart failure, suspected ischemic cardiomyopathy. LVEF 25-30% with chronic LBBB. Initially treated with beta blocker which worsened wheezing, no ACE-I secondary to Appears euvolemic. 6. Elevated troponin secondary to demand ischemia. 7. Hyponatremia suspect CHF. Labile. Followed by nephrology. Treated with fluid restriction, Lasix and fluids.appears stable.  8. AKI superimposed on CKD stage III better than baseline though BUN higher. 9. Hypothyroidism. TSH within normal limits. 10. Severe malnutrition.  11. Obesity unspecific   Continuing to improve. Her acute respiratory issues appear to be stable.   plan a considerable salt restriction, free water restriction. Continue oral Lasix.    Change to oral  steroids.  Anticipate transfer to skilled nursing facility 2/20  Code Status: full code DVT prophylaxis: Lovenox Family Communication:  Disposition Plan: SNF  Brendia Sacks, MD  [facility]  Pager [phone] If 7PM-7AM, please contact night-coverage at [email or web address], password [facility] 06/07/2014, 2:18 PM  LOS: 11 days   Consultants:  Pulmonology  Cardiology  Nephrology  PT: SNF  Procedures:  2-D echocardiogram Impressions:  - Images are limited. Normal LV wall thickness with LVEF approximately 25-30%, septal dyssynergy noted with LBBB. Wall motion abnormalities also noted as outlined above. Indeterminate diastolic function. Mild mitral regurgitation. Trivial tricuspid regurgitation. Unable to assess PASP, but CVP elevated.  Antibiotics:  IV ROCEPHIN 05/27/2014>>>> 05/29/2014  IV AZITHROMYCIN 05/27/2014 >>>> 05/29/2014  Oral Augmentin 05/30/2014>>>> 05/31/2014  IV Levaquin 05/31/2014 >>2/17  HPI/Subjective: Refused BiPAP last night. Pulmonology notes that she is markedly improved.  Feeling better, breathing better. She has no complaints at this point.  Objective: Filed Vitals:   06/06/14 2326 06/07/14 0250 06/07/14 0626 06/07/14 0710  BP: 114/67  153/61   Pulse: 64  70   Temp: 98.3 F (36.8 C)  98.4 F (36.9 C)   TempSrc: Oral  Oral   Resp: 20  18   Height:      Weight:   82 kg (180 lb 12.4 oz)   SpO2: 96% 95% 95% 95%    Intake/Output Summary (Last 24 hours) at 06/07/14 1418 Last data filed at 06/07/14 0944  Gross per 24 hour  Intake    870 ml  Output   1150 ml  Net   -280 ml     Filed Weights   06/05/14 0500 06/06/14 0500 06/07/14 0626  Weight: 79.8 kg (175 lb 14.8 oz) 81.5 kg (179 lb 10.8 oz) 82 kg (180 lb 12.4 oz)    Exam:     Afebrile, VSS. Stable hypoxia on 3 L [place]. General: Appears calm and comfortable, appears better today Cardiovascular: RRR, no m/r/g. No LE edema. Telemetry: SR, no arrhythmias  Respiratory: CTA  bilaterally, no w/r/r. Normal respiratory effort. Musculoskeletal: grossly normal tone BUE/BLE Psychiatric: grossly normal mood and affect, speech fluent and appropriate  Data Reviewed:  Urine output 1700  -9045.6 L since admission   Na+ labile, up to 130 today. BUN and creatinine labile but without significant change last 5 days. Appears to be at baseline.  Pertinent data:  Labs    Scheduled Meds: . antiseptic oral rinse  7 mL Mouth Rinse q12n4p  . aspirin EC  81 mg Oral Daily  . brinzolamide  1 drop Right Eye 2 times per day  . budesonide-formoterol  2 puff Inhalation BID  . chlorhexidine  15 mL Mouth Rinse BID  . diltiazem  240 mg Oral Daily  . enoxaparin (LOVENOX) injection  30 mg Subcutaneous Q24H  . feeding supplement (ENSURE COMPLETE)  237 mL Oral TID BM  . furosemide  20 mg Oral Daily  . guaiFENesin  1,200 mg Oral BID  . ipratropium  0.5 mg Nebulization Q6H  . latanoprost  1 drop Right Eye QHS  . levalbuterol  0.63 mg Nebulization Q6H  . levothyroxine  75 mcg Oral QAC breakfast  . loratadine  10 mg Oral Daily  . LORazepam  0.5 mg Oral QHS  . magnesium oxide  400 mg Oral Daily  . methylPREDNISolone (SOLU-MEDROL) injection  80 mg Intravenous Q12H  . nystatin  5 mL Oral QID  . pantoprazole  40 mg Oral Daily  . polyethylene glycol  17 g Oral BID  . potassium chloride  20 mEq Oral Daily  . sodium chloride  10-40 mL Intracatheter Q12H   Continuous Infusions:   Principal Problem:   Acute respiratory failure Active Problems:   Hypothyroid   CKD (chronic kidney disease), stage III   Asthma, chronic   Hyponatremia   CAP (community acquired pneumonia)   Hypoxia   Atrial fibrillation with RVR   Cardiomyopathy   Acute exacerbation of CHF (congestive heart failure)   COPD exacerbation   Protein-calorie malnutrition, severe   Severe left ventricular systolic dysfunction   Time spent 20 minutes

## 2014-06-08 ENCOUNTER — Encounter (HOSPITAL_COMMUNITY): Payer: Self-pay | Admitting: Family Medicine

## 2014-06-08 ENCOUNTER — Inpatient Hospital Stay
Admission: RE | Admit: 2014-06-08 | Discharge: 2014-07-19 | Disposition: E | Payer: Medicare Other | Source: Ambulatory Visit | Attending: Internal Medicine | Admitting: Internal Medicine

## 2014-06-08 LAB — GLUCOSE, CAPILLARY: GLUCOSE-CAPILLARY: 111 mg/dL — AB (ref 70–99)

## 2014-06-08 MED ORDER — HEPARIN SOD (PORK) LOCK FLUSH 100 UNIT/ML IV SOLN: 500.0000 [IU] | Freq: Once | INTRAVENOUS | Status: DC

## 2014-06-08 MED ORDER — ENSURE COMPLETE PO LIQD: 237.0000 mL | Freq: Three times a day (TID) | ORAL | Status: AC

## 2014-06-08 MED ORDER — LORAZEPAM 0.5 MG PO TABS: 0.5000 mg | ORAL_TABLET | Freq: Every day | ORAL | Status: DC

## 2014-06-08 MED ORDER — IPRATROPIUM BROMIDE 0.02 % IN SOLN: 0.5000 mg | Freq: Four times a day (QID) | RESPIRATORY_TRACT | Status: DC

## 2014-06-08 MED ORDER — FUROSEMIDE 20 MG PO TABS
20.0000 mg | ORAL_TABLET | Freq: Every day | ORAL | Status: DC
Start: 2014-06-08 — End: 2014-07-08

## 2014-06-08 MED ORDER — DILTIAZEM HCL ER COATED BEADS 240 MG PO CP24: 240.0000 mg | ORAL_CAPSULE | Freq: Every day | ORAL | Status: DC

## 2014-06-08 MED ORDER — LEVALBUTEROL HCL 0.63 MG/3ML IN NEBU: 0.6300 mg | INHALATION_SOLUTION | Freq: Four times a day (QID) | RESPIRATORY_TRACT | Status: DC

## 2014-06-08 MED ORDER — LEVALBUTEROL HCL 0.63 MG/3ML IN NEBU: 0.6300 mg | INHALATION_SOLUTION | RESPIRATORY_TRACT | Status: DC | PRN

## 2014-06-08 MED ORDER — POTASSIUM CHLORIDE CRYS ER 20 MEQ PO TBCR: 20.0000 meq | EXTENDED_RELEASE_TABLET | Freq: Every day | ORAL | Status: DC

## 2014-06-08 MED ORDER — PREDNISONE 10 MG PO TABS: ORAL_TABLET | ORAL | Status: DC

## 2014-06-08 MED ORDER — NYSTATIN 100000 UNIT/ML MT SUSP: 5.0000 mL | Freq: Four times a day (QID) | OROMUCOSAL | Status: DC

## 2014-06-08 MED ORDER — BUDESONIDE-FORMOTEROL FUMARATE 160-4.5 MCG/ACT IN AERO: 2.0000 | INHALATION_SPRAY | Freq: Two times a day (BID) | RESPIRATORY_TRACT | Status: DC

## 2014-06-08 NOTE — Progress Notes (Signed)
She is apparently being discharged to skilled care facility today so I will of course Sign off.    Thanks for allowing me to participate in her care

## 2014-06-08 NOTE — Progress Notes (Signed)
Patient discharged to [facility] today.  Foley catheter removed and PICC line removed, no complications or bleeding.  [facility] with all questions answered.  [facility], [facility] and [facility].

## 2014-06-08 NOTE — Progress Notes (Signed)
PROGRESS NOTE  Daisy Scott WJX:[id number] DOB: 16-Aug-1918 DOA: 05/27/2014 PCP: Rudi Heap, MD  Summary: 81 year old woman presented with shortness of breath, cough, productive cough. She was directly admitted for pneumonia, asthma exacerbation. Seen in consultation with cardiology for new dx of atrial fibrillation, family declined anticoagulation and rate-control was recommended. Beta-blocker resulted in bronchospasm so despite low LVEF patient treated with CCB per cardiology. Remained hypoxic, multifactorial. Was treated for asthma exacerbation with bronchospasm, pneumonia, acute systolic CHF requiring transfer to SDU and intermittent BiPAP. Other issues included hyponatremia, treated by nephrology with fluid restriction and Lasix.  Assessment/Plan: 1. Acute hypoxic respiratory failure: stable, likely will need indefinite oxygen supplementation secondary to CHF, underlying asthma. Likely multifactorial including COPD exacerbation, community-acquired pneumonia, complicated by heart failure and atrial fibrillation (now in SR). VQ scan negative for pulmonary embolism. 2. New diagnosis of atrial fibrillation with RVR, LBBB, now in SR. Thought secondary to underlying pulmonology issues. CHADS2 = 5. Family declined anticoagulation. TSH normal. 2-D echocardiogram revealed systolic dysfunction with diffuse hypokinesis. Lopressor changed to Cardizem because of wheezing. Continue ASA 81 mg daily. 3. Acute systolic congestive heart failure, suspected ischemic cardiomyopathy. LVEF 25-30% with chronic LBBB. Now compensated. Initially treated with beta blocker which worsened wheezing, no ACE-I secondary to renal dysfunction. Continue oral Lasix 4. Presumed community acquired pneumonia resolved. Has completed treatment with antibiotics.  5. Acute COPD exacerbation. Resolved. 6. Elevated troponin secondary to demand ischemia. 7. Hyponatremia, suspected hypervolemic hyponatremia, suspect CHF. Labile. Followed  by nephrology. Treated with fluid restriction, Lasix and fluids. Stable, asymptomatic. 8. AKI (resolved) superimposed on CKD stage III better than baseline.  9. Hypothyroidism. TSH within normal limits. 10. Severe malnutrition.  11. Obesity unspecific   Continuing to improve. Respiratory status stable. Weight stable.  Plan a considerable salt restriction, free water restriction. Daily weights. Continue oral Lasix.    Oral steroid taper.  Transfer to skilled nursing facility today  Consider palliative care as an outpatient  Brendia Sacks, MD  [facility]  Pager [phone] If 7PM-7AM, please contact night-coverage at [email or web address], password [facility] 05/23/2014, 11:47 AM  LOS: 12 days   Consultants:  Pulmonology  Cardiology  Nephrology  PT: SNF  Procedures:  2-D echocardiogram Impressions:  - Images are limited. Normal LV wall thickness with LVEF approximately 25-30%, septal dyssynergy noted with LBBB. Wall motion abnormalities also noted as outlined above. Indeterminate diastolic function. Mild mitral regurgitation. Trivial tricuspid regurgitation. Unable to assess PASP, but CVP elevated.  2/13 PICC line  Antibiotics:  IV ROCEPHIN 05/27/2014>>>> 05/29/2014  IV AZITHROMYCIN 05/27/2014 >>>> 05/29/2014  Oral Augmentin 05/30/2014>>>> 05/31/2014  IV Levaquin 05/31/2014 >>2/17  HPI/Subjective: Did well without BiPAP. Pulmonology and cardiology have signed off.  Breathing fine, eating well.  Objective: Filed Vitals:   06/07/14 2136 05/30/2014 0338 06/01/2014 0627 06/07/2014 0706  BP: 133/47  138/95   Pulse: 77  70   Temp: 98.2 F (36.8 C)  98.6 F (37 C)   TempSrc: Oral  Oral   Resp: 20  20   Height:      Weight:   81.1 kg (178 lb 12.7 oz)   SpO2: 95% 91% 93% 94%    Intake/Output Summary (Last 24 hours) at 06/04/2014 1147 Last data filed at 05/25/2014 1014  Gross per 24 hour  Intake   1000 ml  Output   2200 ml  Net  -1200 ml     Filed  Weights   06/06/14 0500 06/07/14 0626 06/06/2014 0627  Weight: 81.5 kg (179 lb 10.8 oz) 82  kg (180 lb 12.4 oz) 81.1 kg (178 lb 12.7 oz)    Exam:     Afebrile, VSS. Stable hypoxia on 3 L [place]. General:  Appears comfortable, calm. Cardiovascular: Regular rate and rhythm, no murmur, rub or gallop. No lower extremity edema. Respiratory: Clear to auscultation bilaterally, no wheezes, rales or rhonchi. Normal respiratory effort. Abdomen: soft, ntnd, obese Musculoskeletal: grossly normal tone bilateral upper and lower extremities Psychiatric: grossly normal mood and affect, speech fluent and appropriate Neurologic: grossly non-focal.  Data Reviewed:  Urine output 2200. Weight stable, not reliable.  -9895.6 L since admission   Scheduled Meds: . antiseptic oral rinse  7 mL Mouth Rinse q12n4p  . aspirin EC  81 mg Oral Daily  . brinzolamide  1 drop Right Eye 2 times per day  . budesonide-formoterol  2 puff Inhalation BID  . chlorhexidine  15 mL Mouth Rinse BID  . diltiazem  240 mg Oral Daily  . enoxaparin (LOVENOX) injection  30 mg Subcutaneous Q24H  . feeding supplement (ENSURE COMPLETE)  237 mL Oral TID BM  . furosemide  20 mg Oral Daily  . guaiFENesin  1,200 mg Oral BID  . ipratropium  0.5 mg Nebulization Q6H  . latanoprost  1 drop Right Eye QHS  . levalbuterol  0.63 mg Nebulization Q6H  . levothyroxine  75 mcg Oral QAC breakfast  . loratadine  10 mg Oral Daily  . LORazepam  0.5 mg Oral QHS  . magnesium oxide  400 mg Oral Daily  . nystatin  5 mL Oral QID  . pantoprazole  40 mg Oral Daily  . polyethylene glycol  17 g Oral BID  . potassium chloride  20 mEq Oral Daily  . predniSONE  40 mg Oral Q breakfast  . sodium chloride  10-40 mL Intracatheter Q12H   Continuous Infusions:   Principal Problem:   Acute respiratory failure Active Problems:   Hypothyroid   CKD (chronic kidney disease), stage III   Asthma, chronic   Hyponatremia   CAP (community acquired pneumonia)    Hypoxia   Atrial fibrillation with RVR   Cardiomyopathy   Acute exacerbation of CHF (congestive heart failure)   COPD exacerbation   Protein-calorie malnutrition, severe   Severe left ventricular systolic dysfunction

## 2014-06-08 NOTE — Progress Notes (Signed)
Patient appears to be resting without problems will try to see how she does off BiPAP tonight she seemed not to have problems last night as per Dr Juanetta GoslingHawkins note.

## 2014-06-08 NOTE — Discharge Summary (Signed)
Physician Discharge Summary  Daisy Scott ZOX:[id number] DOB: 07-30-1918 DOA: 05/27/2014  PCP: Rudi Heap, MD  Admit date: 05/27/2014 Discharge date: 05/28/2014  Recommendations for Outpatient Follow-up:  1. Hypoxia, multifactorial, see below. Continue his nasal cannula 3 L oxygen. May be able to wean over time. Monitor for recurrent need for BiPAP at night although the patient has done well the last 2 nights without it.  2. New diagnosis of atrial fibrillation, family declined anticoagulation.  3. Systolic heart failure. Recommend daily weights, follow intake and output. Recommend free water restriction secondary to hyponatremia.  4. Hyponatremia. Consider periodic basic metabolic panel.  5. Severe malnutrition.  6. Consider palliative care as an outpatient. Long-term prognosis is poor. Recommend addressing goals of care.  Discharge Diagnoses:  1. Acute hypoxic respiratory failure 2. Acute systolic congestive heart failure 3. Presumed community-acquired pneumonia 4. Acute COPD/asthma exacerbation 5. New diagnosis of atrial fibrillation with rapid ventricular response, left bundle branch block 6. Demand ischemia 7. Hypervolemic hyponatremia 8. Acute kidney injury superimposed on chronic kidney disease stage III 9. Hypothyroidism 10. Obesity 11. Severe malnutrition  Discharge Condition: Improved Disposition: Skilled nursing facility for short-term rehabilitation  Diet recommendation: 2 g sodium diet, no free water. Recommend fluid restriction 1500 mL, titrate as needed to maintain hydration and prevent volume overload.  Filed Weights   06/06/14 0500 06/07/14 0626 05/31/2014 0627  Weight: 81.5 kg (179 lb 10.8 oz) 82 kg (180 lb 12.4 oz) 81.1 kg (178 lb 12.7 oz)    History of present illness:  81 year old woman presented with shortness of breath, cough, productive cough. She was directly admitted for pneumonia, asthma exacerbation.   Hospital Course:  Ms. Philyaw was with  empiric antibiotics and seen in consultation with cardiology for new dx of atrial fibrillation, family declined anticoagulation and rate-control was recommended. Beta-blocker resulted in bronchospasm so despite low LVEF patient treated with CCB per cardiology. Remained hypoxic, multifactorial. Was treated for asthma exacerbation with bronchospasm, pneumonia, acute systolic CHF requiring transfer to SDU and intermittent BiPAP. Other issues included hyponatremia, treated by nephrology with fluid restriction and Lasix. She gradually improved with antibiotics, steroids, bronchodilators, oxygen. She is now stable for discharge. Individual issues as below.   Acute hypoxic respiratory failure: stable, likely will need indefinite oxygen supplementation secondary to CHF, underlying asthma. Likely multifactorial including COPD exacerbation, community-acquired pneumonia, complicated by heart failure and atrial fibrillation (now in SR). VQ scan negative for pulmonary embolism.  New diagnosis of atrial fibrillation with RVR, LBBB, now in SR. Thought secondary to underlying pulmonology issues. CHADS2 = 5. Family declined anticoagulation. TSH normal. 2-D echocardiogram revealed systolic dysfunction with diffuse hypokinesis. Lopressor changed to Cardizem because of wheezing. Continue ASA 81 mg daily.  Acute systolic congestive heart failure, suspected ischemic cardiomyopathy. LVEF 25-30% with chronic LBBB. Now compensated. Initially treated with beta blocker which worsened wheezing, no ACE-I secondary to renal dysfunction. Continue oral Lasix  Presumed community acquired pneumonia resolved. Has completed treatment with antibiotics.   Acute COPD exacerbation. Resolved.  Elevated troponin secondary to demand ischemia.  Hyponatremia, suspected hypervolemic hyponatremia, suspect CHF. Labile. Followed by nephrology. Treated with fluid restriction, Lasix and fluids. Stable, asymptomatic.  AKI (resolved) superimposed on  CKD stage III better than baseline.   Hypothyroidism. TSH within normal limits.  Severe malnutrition.   Obesity unspecific  Consultants:  Pulmonology  Cardiology  Nephrology  PT: SNF  Procedures:  2-D echocardiogram Impressions:  - Images are limited. Normal LV wall thickness with LVEF approximately 25-30%, septal dyssynergy noted with  LBBB. Wall motion abnormalities also noted as outlined above. Indeterminate diastolic function. Mild mitral regurgitation. Trivial tricuspid regurgitation. Unable to assess PASP, but CVP elevated.  2/13 PICC line  Antibiotics:  IV ROCEPHIN 05/27/2014>>>> 05/29/2014  IV AZITHROMYCIN 05/27/2014 >>>> 05/29/2014  Oral Augmentin 05/30/2014>>>> 05/31/2014  IV Levaquin 05/31/2014 >>2/17  Discharge Instructions      Current Discharge Medication List    START taking these medications   Details  budesonide-formoterol (SYMBICORT) 160-4.5 MCG/ACT inhaler Inhale 2 puffs into the lungs 2 (two) times daily.    diltiazem (CARDIZEM CD) 240 MG 24 hr capsule Take 1 capsule (240 mg total) by mouth daily.    feeding supplement, ENSURE COMPLETE, (ENSURE COMPLETE) LIQD Take 237 mLs by mouth 3 (three) times daily between meals.    furosemide (LASIX) 20 MG tablet Take 1 tablet (20 mg total) by mouth daily.    ipratropium (ATROVENT) 0.02 % nebulizer solution Take 2.5 mLs (0.5 mg total) by nebulization every 6 (six) hours.    !! levalbuterol (XOPENEX) 0.63 MG/3ML nebulizer solution Take 3 mLs (0.63 mg total) by nebulization every 2 (two) hours as needed for wheezing or shortness of breath.    !! levalbuterol (XOPENEX) 0.63 MG/3ML nebulizer solution Take 3 mLs (0.63 mg total) by nebulization every 6 (six) hours.    nystatin (MYCOSTATIN) 100000 UNIT/ML suspension Take 5 mLs (500,000 Units total) by mouth 4 (four) times daily.    potassium chloride SA (K-DUR,KLOR-CON) 20 MEQ tablet Take 1 tablet (20 mEq total) by mouth daily.      predniSONE (DELTASONE) 10 MG tablet Start 2.21 . Take 40 mg by mouth daily for 3 days, then take 20 mg by mouth daily for 3 days, then take 10 mg by mouth daily for 3 days, then stop.     !! - Potential duplicate medications found. Please discuss with provider.    CONTINUE these medications which have CHANGED   Details  LORazepam (ATIVAN) 0.5 MG tablet Take 1 tablet (0.5 mg total) by mouth at bedtime. As directed Qty: 10 tablet, Refills: 0      CONTINUE these medications which have NOT CHANGED   Details  allopurinol (ZYLOPRIM) 100 MG tablet TAKE TWO TABLETS BY MOUTH DAILY Qty: 60 tablet, Refills: 6    aspirin EC 81 MG tablet Take 81 mg by mouth daily.    brinzolamide (AZOPT) 1 % ophthalmic suspension Place 1 drop into the right eye See admin instructions. Uses at 0800 and 1200.    Cranberry 500 MG CAPS Take 1 capsule by mouth daily.    levothyroxine (SYNTHROID, LEVOTHROID) 75 MCG tablet TAKE ONE TABLET EVERY MORNING Qty: 30 tablet, Refills: 5    loratadine (CLARITIN) 10 MG tablet Take 10 mg by mouth daily.    Multiple Vitamin (MULTIVITAMIN WITH MINERALS) TABS tablet Take 1 tablet by mouth daily.    pantoprazole (PROTONIX) 40 MG tablet Take 1 tablet (40 mg total) by mouth daily. Qty: 30 tablet, Refills: 5    sertraline (ZOLOFT) 50 MG tablet TAKE 1/2 TABLET DAILY Qty: 15 tablet, Refills: 5    travoprost, benzalkonium, (TRAVATAN) 0.004 % ophthalmic solution Place 1 drop into the right eye at bedtime.      STOP taking these medications     albuterol (PROVENTIL HFA;VENTOLIN HFA) 108 (90 BASE) MCG/ACT inhaler      amLODipine (NORVASC) 5 MG tablet      azithromycin (ZITHROMAX) 250 MG tablet      salmeterol (SEREVENT DISKUS) 50 MCG/DOSE diskus inhaler  theophylline (THEODUR) 300 MG 12 hr tablet        Allergies  Allergen Reactions  . Lisinopril Other (See Comments)    Side of face dropped.  Looked like patient had had a stroke.  . Sulfa Antibiotics     Possible  reaction- vomited And angio edema  . Ciprofloxacin Nausea Only  . Macrobid [Nitrofurantoin Macrocrystal] Nausea Only    The results of significant diagnostics from this hospitalization (including imaging, microbiology, ancillary and laboratory) are listed below for reference.    Significant Diagnostic Studies: Dg Chest 2 View  05/30/2014   CLINICAL DATA:  Cough and shortness of Breath progressing over the past 4 days.  EXAM: CHEST  2 VIEW  COMPARISON:  05/27/2014  FINDINGS: The heart is enlarged. There is tortuosity, ectasia and calcification of the thoracic aorta. New bibasilar densities suspicious for pneumonia. No pulmonary edema or pleural effusion.  IMPRESSION: Suspect new bibasilar infiltrates.   Electronically Signed   By: Rudie Meyer M.D.   On: 05/30/2014 15:52   Dg Chest 2 View  05/27/2014   ADDENDUM REPORT: 05/27/2014 11:31  ADDENDUM: In the body of the report it should read no acute bony abnormality. In the impression report issued. Small bilateral pleural effusions appear. Similar findings were noted on prior chest x-ray of 1 /15/ 2015.   Electronically Signed   By: Maisie Fus  Register   On: 05/27/2014 11:31   05/27/2014   CLINICAL DATA:  Congestion x1 week .  EXAM: CHEST  2 VIEW  COMPARISON:  None.  FINDINGS: Mediastinum and hilar structures are normal. Cardiomegaly with mild pulmonary vascular prominence. Bilateral small pleural effusions are noted. No focal infiltrate. No acute bony abnormality P  IMPRESSION: 1. Cardiomegaly with mild pulmonary vascular prominence. 2. Small bilateral pleural effusions appear  Electronically Signed: ByMaisie Fus  Register On: 05/27/2014 10:32   Nm Pulmonary Perf And Vent  05/29/2014   CLINICAL DATA:  Shortness of breath  EXAM: NUCLEAR MEDICINE VENTILATION - PERFUSION LUNG SCAN  TECHNIQUE: Ventilation images were obtained in multiple projections using inhaled aerosol technetium 99 M DTPA. Perfusion images were obtained in multiple projections after  intravenous injection of Tc-29m MAA.  RADIOPHARMACEUTICALS:  42 mCi Tc-60m DTPA aerosol and 6 mCi Tc-103m MAA  COMPARISON:  05/27/2014.  FINDINGS: Ventilation: Ventilation images show some patchy changes likely related to COPD. Central trapping of tracer is noted as well. No large ventilation defect is noted.  Perfusion: Overlying defect from the patient's arms is noted. No large segmental perfusion defect to suggest pulmonary embolism is identified. Changes consistent with a small pleural effusion seen on recent chest x-ray are noted.  IMPRESSION: Changes consistent with COPD in a right-sided pleural effusion.  No definitive pulmonary embolism is seen.   Electronically Signed   By: Alcide Clever M.D.   On: 05/29/2014 12:03   US Venous Img Lower Unilateral Right  05/28/2014   CLINICAL DATA:  Right leg swelling  EXAM: RIGHT LOWER EXTREMITY VENOUS DUPLEX ULTRASOUND  TECHNIQUE: Doppler venous assessment of the right lower extremity deep venous system was performed, including characterization of spectral flow, compressibility, and phasicity.  COMPARISON:  None.  FINDINGS: There is complete compressibility of the right common femoral, femoral, and popliteal veins. Doppler analysis demonstrates respiratory phasicity and augmentation of flow upon calf compression. No obvious calf vein thrombosis.  IMPRESSION: No evidence of right lower extremity DVT.   Electronically Signed   By: Jolaine Click M.D.   On: 05/28/2014 10:16   Dg Chest  Port 1 View  06/03/2014   CLINICAL DATA:  Short of breath.  Followup abnormal lung density.  EXAM: PORTABLE CHEST - 1 VIEW  COMPARISON:  06/01/2014 and previous  FINDINGS: Right arm PICC has its tip in the SVC at the azygos level. Artifact overlies the chest elsewhere. The heart is enlarged. The aorta is unfolded. Abnormal density persists in both lower lobes consistent with atelectasis or patchy pneumonia. No new finding.  IMPRESSION: Persistent abnormal density in both lower lobes consistent  with atelectasis and or pneumonia. No change.   Electronically Signed   By: Paulina FusiMark  Shogry M.D.   On: 06/03/2014 07:21   Dg Chest Port 1 View  06/01/2014   CLINICAL DATA:  PICC line placement. Acute respiratory failure, pneumonia, cough, CHF, COPD.  EXAM: PORTABLE CHEST - 1 VIEW  COMPARISON:  05/31/2014 and prior radiographs  FINDINGS: A right PICC line is now noted with tip overlying the mid SVC.  Cardiomegaly and pulmonary vascular congestion again noted.  Stable opacities overlying the mid and lower right lung again noted.  There is no evidence of pneumothorax.  Mild bibasilar atelectasis is present.  IMPRESSION: Right PICC line with tip overlying the mid SVC.  Otherwise unchanged chest radiograph.   Electronically Signed   By: Harmon PierJeffrey  Hu M.D.   On: 06/01/2014 13:53   Dg Chest Port 1 View  05/31/2014   CLINICAL DATA:  Shortness of breath.  EXAM: PORTABLE CHEST - 1 VIEW  COMPARISON:  05/30/2014 and 05/27/2014.  FINDINGS: There appears to be increased densities in the right hilum and medial right lung base. Heart size is upper limits of normal but minimally changed. Upper lungs appear to be clear.  IMPRESSION: Questionable densities in the right hilum and right lower lung region. Foci of airspace disease or infection cannot be excluded. Recommend continued follow-up.   Electronically Signed   By: Richarda OverlieAdam  Henn M.D.   On: 05/31/2014 08:20    Microbiology: Recent Results (from the past 240 hour(s))  Culture, Urine     Status: None   Collection Time: 05/29/14  5:46 PM  Result Value Ref Range Status   Specimen Description URINE, CLEAN CATCH  Final   Special Requests NONE  Final   Colony Count NO GROWTH Performed at [facility]   Final   Culture NO GROWTH Performed at [facility]   Final   Report Status 05/31/2014 FINAL  Final  MRSA PCR Screening     Status: None   Collection Time: 05/31/14 10:04 AM  Result Value Ref Range Status   MRSA by PCR NEGATIVE NEGATIVE Final    Comment:         The GeneXpert MRSA Assay (FDA approved for NASAL specimens only), is one component of a comprehensive MRSA colonization surveillance program. It is not intended to diagnose MRSA infection nor to guide or monitor treatment for MRSA infections.      Labs: Basic Metabolic Panel:  Recent Labs Lab 06/02/14 1453 06/03/14 0440 06/04/14 0833 06/06/14 0509 06/07/14 0608  NA 128* 130* 134* 127* 130*  K 4.0 4.9 3.5 3.6 4.3  CL 92* 101 98 91* 89*  CO2 29 26 30 30 31   GLUCOSE 106* 133* 122* 122* 124*  BUN 38* 28* 29* 37* 42*  CREATININE 1.29* 1.05 1.14* 1.15* 1.28*  CALCIUM 7.7* 6.7* 8.1* 7.8* 8.3*  MG  --   --   --  1.5  --    Liver Function Tests:  Recent Labs Lab 06/05/14  0423  ALBUMIN 2.9*   CBC:  Recent Labs Lab 06/02/14 0445 06/03/14 0440 06/04/14 0833  WBC 5.3 7.6 8.0  HGB 8.9* 10.2* 12.2  HCT 25.5* 29.3* 36.3  MCV 89.5 90.4 91.4  PLT 189 228 299     Recent Labs  05/31/14 0518 06/01/14 1436 06/02/14 0445  BNP 939.0* 1401.0* 816.0*    CBG:  Recent Labs Lab 06/13/2014 1119  GLUCAP 111*    Principal Problem:   Acute respiratory failure Active Problems:   Hypothyroid   CKD (chronic kidney disease), stage III   Asthma, chronic   Hyponatremia   CAP (community acquired pneumonia)   Hypoxia   Atrial fibrillation with RVR   Cardiomyopathy   Acute exacerbation of CHF (congestive heart failure)   COPD exacerbation   Protein-calorie malnutrition, severe   Severe left ventricular systolic dysfunction   Time coordinating discharge: 45 minutes  Signed:  Brendia Sacks, MD [facility] 05/29/2014, 12:31 PM

## 2014-06-09 ENCOUNTER — Encounter (HOSPITAL_COMMUNITY)
Admission: RE | Admit: 2014-06-09 | Discharge: 2014-06-09 | Disposition: A | Discharge status: Home / Self Care | Payer: Medicare Other | Source: Ambulatory Visit | Attending: Internal Medicine | Admitting: Internal Medicine

## 2014-06-09 LAB — BASIC METABOLIC PANEL
ANION GAP: 8 (ref 5–15)
BUN: 47 mg/dL — ABNORMAL HIGH (ref 6–23)
CALCIUM: 8.7 mg/dL (ref 8.4–10.5)
CHLORIDE: 96 mmol/L (ref 96–112)
CO2: 32 mmol/L (ref 19–32)
CREATININE: 1.23 mg/dL — AB (ref 0.50–1.10)
GFR calc Af Amer: 42 mL/min — ABNORMAL LOW (ref >90)
GFR calc non Af Amer: 37 mL/min — ABNORMAL LOW (ref >90)
Glucose, Bld: 87 mg/dL (ref 70–99)
Potassium: 4.5 mmol/L (ref 3.5–5.1)
Sodium: 136 mmol/L (ref 135–145)

## 2014-06-10 ENCOUNTER — Non-Acute Institutional Stay (SKILLED_NURSING_FACILITY): Payer: Medicare Other | Admitting: Internal Medicine

## 2014-06-10 DIAGNOSIS — J9601 Acute respiratory failure with hypoxia: Secondary | ICD-10-CM

## 2014-06-10 DIAGNOSIS — E871 Hypo-osmolality and hyponatremia: Secondary | ICD-10-CM

## 2014-06-10 DIAGNOSIS — I255 Ischemic cardiomyopathy: Secondary | ICD-10-CM

## 2014-06-10 DIAGNOSIS — I5041 Acute combined systolic (congestive) and diastolic (congestive) heart failure: Secondary | ICD-10-CM

## 2014-06-12 ENCOUNTER — Non-Acute Institutional Stay (SKILLED_NURSING_FACILITY): Payer: Medicare Other | Admitting: Internal Medicine

## 2014-06-12 ENCOUNTER — Encounter (HOSPITAL_COMMUNITY)
Admission: RE | Admit: 2014-06-12 | Discharge: 2014-06-12 | Disposition: A | Discharge status: Home / Self Care | Payer: Medicare Other | Source: Ambulatory Visit | Attending: Internal Medicine | Admitting: Internal Medicine

## 2014-06-12 DIAGNOSIS — I5041 Acute combined systolic (congestive) and diastolic (congestive) heart failure: Secondary | ICD-10-CM

## 2014-06-12 DIAGNOSIS — J9601 Acute respiratory failure with hypoxia: Secondary | ICD-10-CM

## 2014-06-12 DIAGNOSIS — D72829 Elevated white blood cell count, unspecified: Secondary | ICD-10-CM

## 2014-06-12 DIAGNOSIS — J441 Chronic obstructive pulmonary disease with (acute) exacerbation: Secondary | ICD-10-CM

## 2014-06-12 LAB — CBC WITH DIFFERENTIAL/PLATELET
BASOS PCT: 0 % (ref 0–1)
Basophils Absolute: 0 10*3/uL (ref 0.0–0.1)
Eosinophils Absolute: 0.2 10*3/uL (ref 0.0–0.7)
Eosinophils Relative: 1 % (ref 0–5)
HEMATOCRIT: 41.4 % (ref 36.0–46.0)
HEMOGLOBIN: 14 g/dL (ref 12.0–15.0)
Lymphocytes Relative: 18 % (ref 12–46)
Lymphs Abs: 2.4 10*3/uL (ref 0.7–4.0)
MCH: 30.9 pg (ref 26.0–34.0)
MCHC: 33.8 g/dL (ref 30.0–36.0)
MCV: 91.4 fL (ref 78.0–100.0)
MONO ABS: 0.9 10*3/uL (ref 0.1–1.0)
MONOS PCT: 7 % (ref 3–12)
Neutro Abs: 10 10*3/uL — ABNORMAL HIGH (ref 1.7–7.7)
Neutrophils Relative %: 74 % (ref 43–77)
Platelets: 185 10*3/uL (ref 150–400)
RBC: 4.53 MIL/uL (ref 3.87–5.11)
RDW: 15.2 % (ref 11.5–15.5)
WBC: 13.5 10*3/uL — ABNORMAL HIGH (ref 4.0–10.5)

## 2014-06-12 LAB — BASIC METABOLIC PANEL
Anion gap: 8 (ref 5–15)
BUN: 42 mg/dL — ABNORMAL HIGH (ref 6–23)
CALCIUM: 8.6 mg/dL (ref 8.4–10.5)
CO2: 30 mmol/L (ref 19–32)
Chloride: 99 mmol/L (ref 96–112)
Creatinine, Ser: 1.08 mg/dL (ref 0.50–1.10)
GFR calc Af Amer: 50 mL/min — ABNORMAL LOW (ref >90)
GFR, EST NON AFRICAN AMERICAN: 43 mL/min — AB (ref >90)
GLUCOSE: 77 mg/dL (ref 70–99)
POTASSIUM: 3.9 mmol/L (ref 3.5–5.1)
SODIUM: 137 mmol/L (ref 135–145)

## 2014-06-12 NOTE — Progress Notes (Addendum)
Patient ID: Daisy Scott, female   DOB: [birth year], 81 y.o.   MRN: [id number]                 HISTORY & PHYSICAL  DATE:  06/10/2014               FACILITY: [facility]                  LEVEL OF CARE:   SNF   CHIEF COMPLAINT:  Admission to SNF, post stay at [facility], 05/27/2014 through 16-Jun-2014.        HISTORY OF PRESENT ILLNESS:  This is a 81 year-old patient who apparently lives with family in [place].  Normally, states she walks with a walker and has some form of wheelchair.  She was admitted with acute hypoxic respiratory failure, felt to be secondary to pneumonia, COPD exacerbation, and CHF.    She was treated for community-acquired pneumonia.   X-ray showed left lower lobe greater than right lower lobe infiltrates.    She was also treated for hyponatremia with fluid restriction and Lasix.    She gradually improved with antibiotics, steroids, bronchodilators, and oxygen.  It was felt that she would need long-standing oxygen.     Also noted that she had new onset atrial fibrillation.  Family declined anticoagulation.  Her TSH was normal.    Her echocardiogram showed an EF of 25-30%, now with left bundle branch block.    Her atrial fibrillation was initially treated with beta blockers.  However, she developed bronchospasms.  Therefore, she was changed to calcium blockers even though her blood pressure was apparently fairly marginal.  She was felt to have elevated troponins secondary to demand ischemia.    PAST MEDICAL HISTORY/PROBLEM LIST:                                  Acute hypoxic respiratory failure.    Acute systolic heart failure.    Presumed community-acquired pneumonia.     Acute COPD and/or asthma exacerbation.    New diagnosis of atrial fibrillation with rapid ventricular response and left bundle branch block.    Demand ischemia.  Felt to be the cause of her increased troponins.    Hypervolemic hyponatremia.    Acute renal  insufficiency.     Hypothyroidism.    Obesity.    Severe malnutrition, although I do not see nutritional parameters.    CURRENT MEDICATIONS:   Discharge medications include:     Symbicort 160/4.5, 2 puffs b.i.d.     Diltiazem 240 daily.    Lasix 20 daily.    Atrovent nebulizers 0.5 q.6.    Xopenex 0.63 every 2 hours as needed p.r.n.    Mycostatin 500,000 U four times daily for seven days.    KCl 20 q.d.      Prednisone 10 q.d.      Lorazepam 0.5 q.h.s.      Allopurinol 100 mg, 2 tablets daily.    Enteric-coated aspirin 81 q.d.       Azopt ophthalmic 1 drop into the right eye daily.    Cranberry 500 daily.      Synthroid 75 daily.    Claritin 10 q.d.       Protonix 40 q.d.     Zoloft 50 mg,  tablet at bedtime.     Travatan ophthalmic 0.004%, 1 drop into the eye at bedtime.  SOCIAL HISTORY:                   FUNCTIONAL STATUS:  I do not have a good sense of her functional status.  She tells me she could walk with a walker, but also has a wheelchair.   TOBACCO USE:  Non-smoker.   ALCOHOL:  Non-drinker.    FAMILY HISTORY:   Not currently available.    REVIEW OF SYSTEMS:            CHEST/RESPIRATORY:  States she is not short of breath.     CARDIAC:  No chest pain.   No palpitations.   GI:  No diarrhea.  No abdominal pain.    GU:  No dysuria.    MUSCULOSKELETAL:  Lower extremities:  No pain.  However, marked weakness.  The patient admits to this.    PHYSICAL EXAMINATION:   VITAL SIGNS:     PULSE:  60, in atrial fib.   RESPIRATIONS:  20, and unlabored.   02 SATURATIONS:  95% on 2 L.   GENERAL APPEARANCE:  This patient is a 81 year-old woman who is alert and responsive, but looks very frail.   CHEST/RESPIRATORY:  Surprisingly clear air entry bilaterally, including both lower lobes and the right middle lobe.  There is no wheezing.  No accessory muscle use.   CARDIOVASCULAR:   CARDIAC:  Atrial fibrillation.  Pulse is 60.  There are no murmurs.  No gallops.   Her JVP is not elevated.   In fact, she appears to be euvolemic.   GASTROINTESTINAL:   ABDOMEN:  No masses.    LIVER/SPLEEN/KIDNEY:   No liver, no spleen.  No tenderness.    GENITOURINARY:   BLADDER:  Not distended.  No tenderness.  No CVA tenderness.   NEUROLOGICAL:   DEEP TENDON REFLEXES:  She is hyporeflexic.   SENSATION/STRENGTH:  Most notable for profound proximal lower extremity weakness in hip flexion and abduction.  No sensory loss.   BALANCE/GAIT:  I cannot stand her, even with maximal assist of one.   She became very fearful, yelling "don't let me fall".    ASSESSMENT/PLAN:                                             Hypoxic respiratory failure.  The patient tells me that she was not previously on oxygen.  Her chest x-ray did look like pneumonia.  She was treated for this.  Her exam currently is remarkable for only how normal her respiratory exam is.    Acute congestive heart failure in the setting of a severe presumably ischemic cardiomyopathy with an ejection fraction of 25-30%.  She has diffuse hypokinesis, including hypokinesis to akinesis of the mid to apical anteroseptal and inferolateral myocardium.  She does not appear to be currently in heart failure.  We will need to check her blood pressures.  Her discharge BUN was 47 and creatinine of 1.23, an estimated GFR of 37.    Acute COPD/asthma exacerbation.  If she had COPD, things seem pretty stable currently.  Would wonder about asthma and/or congestive heart failure.     New onset of atrial fibrillation.  She was not anticoagulated.  Ultimately, she did not tolerate beta blockers and was discharged on Cardizem which, at least in terms of things currently, seems to be stable although we will need to check  her blood pressure.    Hyponatremia.  This got to as low as 115.  Listed as hypervolemic.  She saw Nephrology.  She also had acute kidney injury superimposed on chronic renal failure stage III.    Hypothyroidism.  On  replacement.   This was checked and was within the normal range.    Severe malnutrition, although I do not see these parameters.    Glaucoma.    The patient is already asking to go home.  I think her cardiopulmonary status is stable.  However, she is profoundly weak and, if she was walking, she certainly cannot do that now.  We will need a sense of her premorbid functional status.

## 2014-06-16 NOTE — Progress Notes (Addendum)
Patient ID: Daisy Scott, female   DOB: 1918/12/01, 81 y.o.   MRN: [id number]               PROGRESS NOTE  DATE:  06/12/2014               FACILITY: [facility]                                   LEVEL OF CARE:   SNF   Acute Visit    CHIEF COMPLAINT:  Follow up COPD, leukocytosis.      HISTORY OF PRESENT ILLNESS:  This is a patient whom I admitted to the facility two days ago.  She was previously in the hospital from 05/27/2014 through Aug 31, 2014 with acute COPD, systolic congestive heart failure, and hypoxic respiratory failure.  She had a reasonably complex 12-day hospitalization and seems to have come out reasonably stable in terms of her pulmonary status.    However, lab work today shows a white count of 13.5.  Differential count shows a slight elevation of her granulocytes.   This was last checked in the hospital on 06/04/2014 at 8.    I am here predominantly to follow up on the leukocytosis and also her respiratory status.     LABORATORY DATA:  Lab work from 06/12/2014 shows:    Sodium 137, potassium 3.9, BUN 42, creatinine 1.08.    White count 13.5 with 74% neutrophils, hemoglobin 14, platelet count 185.         REVIEW OF SYSTEMS:    CHEST/RESPIRATORY:  The patient states she does not have excessive shortness of breath.  There is no cough or sputum production.   CARDIAC:  No clear chest pain or palpitations.   GI:  No abdominal pain or diarrhea.    GU:  No dysuria.    PHYSICAL EXAMINATION:   VITAL SIGNS:   O2 SATURATIONS:  95% on 3 L.     RESPIRATIONS:   20, and not particularly labored.   PULSE:   64.   GENERAL APPEARANCE:  The patient does not look in any distress.  Somewhat fatigued.  However, really no overt distress, just looking at her.   CHEST/RESPIRATORY:  Remarkable for maybe a slight reduction in A/E.  However, there is no wheezing.  No crackles.  Her work of breathing is not elevated.  There is no accessory muscle use.   CARDIOVASCULAR:     CARDIAC:  Atrial fibrillation.  There are no murmurs.   She appears to be euvolemic.    JVP is not elevated.   EDEMA/VARICOSITIES:  She has scant lower extremity edema to slightly above her ankle with no evidence of a DVT.   GASTROINTESTINAL:   ABDOMEN:  No masses.     LIVER/SPLEEN/KIDNEYS:  No liver, no spleen.  No tenderness.   GENITOURINARY:   BLADDER:  She has no suprapubic tenderness.  However, she does seem to have some degree of costovertebral angle tenderness.    ASSESSMENT/PLAN:                                   Hypoxemic respiratory failure.  Likely secondary to COPD acute and heart failure.   I must say, I see no evidence of either one of these at the bedside.    Atrial fibrillation.  Heart rate is controlled in  the 60s.  The family declined anticoagulation.    Diastolic heart failure.  Her weight has gone from 177.4 down to 174.4.   I think this is perfectly reasonable.  She is only on Lasix 20 mg once a day.  As long as she seems stable and her weight is going down, I think this is reasonable.  She does not look to be dehydrated.    COPD acute.  Again, this also seems stable.  She is on bupropion bromide nebulizers four times a day.    Leukocytosis.  I think this is mostly a steroid taper issue.  I will repeat this.  I am also going to check a urine culture.  I do not believe she actually needs a chest x-ray, although her discharge chest x-ray in the hospital still shows bibasilar infiltrates.  It would be difficult to believe that this woman has an active bacterial process at this time.

## 2014-06-17 ENCOUNTER — Encounter: Payer: Self-pay | Admitting: Family Medicine

## 2014-06-17 ENCOUNTER — Encounter (HOSPITAL_COMMUNITY)
Admission: RE | Admit: 2014-06-17 | Discharge: 2014-06-17 | Disposition: A | Discharge status: Home / Self Care | Payer: Medicare Other | Source: Skilled Nursing Facility | Attending: Internal Medicine | Admitting: Internal Medicine

## 2014-06-17 ENCOUNTER — Encounter: Payer: Self-pay | Admitting: Adult Health

## 2014-06-17 ENCOUNTER — Ambulatory Visit (INDEPENDENT_AMBULATORY_CARE_PROVIDER_SITE_OTHER): Payer: Medicare Other | Admitting: Adult Health

## 2014-06-17 VITALS — BP 136/72 | HR 64 | Ht 63.0 in

## 2014-06-17 DIAGNOSIS — I482 Chronic atrial fibrillation, unspecified: Secondary | ICD-10-CM

## 2014-06-17 DIAGNOSIS — I5022 Chronic systolic (congestive) heart failure: Secondary | ICD-10-CM

## 2014-06-17 LAB — BASIC METABOLIC PANEL
Anion gap: 7 (ref 5–15)
BUN: 39 mg/dL — AB (ref 6–23)
CHLORIDE: 103 mmol/L (ref 96–112)
CO2: 30 mmol/L (ref 19–32)
Calcium: 8.3 mg/dL — ABNORMAL LOW (ref 8.4–10.5)
Creatinine, Ser: 1.08 mg/dL (ref 0.50–1.10)
GFR calc Af Amer: 50 mL/min — ABNORMAL LOW (ref >90)
GFR, EST NON AFRICAN AMERICAN: 43 mL/min — AB (ref >90)
GLUCOSE: 84 mg/dL (ref 70–99)
POTASSIUM: 4 mmol/L (ref 3.5–5.1)
Sodium: 140 mmol/L (ref 135–145)

## 2014-06-17 LAB — CBC WITH DIFFERENTIAL/PLATELET
BASOS ABS: 0 10*3/uL (ref 0.0–0.1)
Basophils Relative: 0 % (ref 0–1)
Eosinophils Absolute: 0.2 10*3/uL (ref 0.0–0.7)
Eosinophils Relative: 2 % (ref 0–5)
HCT: 30.9 % — ABNORMAL LOW (ref 36.0–46.0)
HEMOGLOBIN: 10.1 g/dL — AB (ref 12.0–15.0)
LYMPHS ABS: 1.8 10*3/uL (ref 0.7–4.0)
Lymphocytes Relative: 21 % (ref 12–46)
MCH: 30.3 pg (ref 26.0–34.0)
MCHC: 32.7 g/dL (ref 30.0–36.0)
MCV: 92.8 fL (ref 78.0–100.0)
MONO ABS: 0.5 10*3/uL (ref 0.1–1.0)
Monocytes Relative: 6 % (ref 3–12)
Neutro Abs: 6.1 10*3/uL (ref 1.7–7.7)
Neutrophils Relative %: 71 % (ref 43–77)
Platelets: 105 10*3/uL — ABNORMAL LOW (ref 150–400)
RBC: 3.33 MIL/uL — AB (ref 3.87–5.11)
RDW: 15 % (ref 11.5–15.5)
Smear Review: DECREASED
WBC: 8.5 10*3/uL (ref 4.0–10.5)

## 2014-06-17 NOTE — Patient Instructions (Signed)
Your physician recommends that you schedule a follow-up appointment in: 3 months with Joni ReiningKathryn Lawrence, NP  Your physician recommends that you continue on your current medications as directed. Please refer to the Current Medication list given to you today.  Please Start wearing Compression Stocking   Thank you for choosing [facility]!

## 2014-06-17 NOTE — Progress Notes (Signed)
Cardiology Office Note   Date:  06/17/2014   ID:  Daisy Scott, DOB [birth year], MRN [id number]  PCP:  Rudi Heap, MD  Cardiologist: Ria Comment, NP   Chief Complaint  Patient presents with  . Atrial Fibrillation    Declined anticoagulation  . Congestive Heart Failure    Systolic      History of Present Illness: Daisy Scott is a 81 y.o. female who presents for ongoing assessment and management of atrial fibrillation, CHADS VASC Score of 5, systolic CHF-EF 25-30%, chronic left bundle branch block, hypertension,treated medically, has been referred to palliative care.she is doing well currently.  She is a resident of the [facility].  And is undergoing physical therapy, she is a low-sodium diet, but she has become very sedentary.  Her daughter, who is with her would like for her to come home soon.  When she is able to get to the bathroom by herself.  They have hospital beds, walkers, wheelchairs, and other equipment at home, to help her.  She denies chest pain, dyspnea on exertion, or weakness.  She is eating better now that she is feeling better, and has put on approximately 7 pounds, without evidence of heart failure.  Past Medical History  Diagnosis Date  . Essential hypertension   . Hypothyroidism   . Asthma   . Hyperlipidemia   . Gout   . CKD (chronic kidney disease) stage 3, GFR 30-59 ml/min   . Ascending aorta dilation     4.2 cm 2011   . Atrial fibrillation     Documented February 2016 - question paroxysmal   . Systolic CHF   . Hypoxia   . Atrial fibrillation with RVR   . Severe malnutrition     Past Surgical History  Procedure Laterality Date  . Rotator cuff repair       No current outpatient prescriptions on file.   No current facility-administered medications for this visit.    Allergies:   Lisinopril; Sulfa antibiotics; Ciprofloxacin; and Macrobid    Social History:  The patient  reports that she has never smoked. She has never  used smokeless tobacco. She reports that she does not drink alcohol or use illicit drugs.   Family History:  The patient'sFamily history is unknown by patient.    ROS: .   All other systems are reviewed and negative.Unless otherwise mentioned in  H&P above.   PHYSICAL EXAM: VS:  BP 136/72 mmHg  Pulse 64  Ht  (1.6 m)  SpO2 95% , BMI There is no weight on file to calculate BMI. GEN: Well nourished, well developed, in no acute distress HEENT: normal Neck: no JVD, carotid bruits, or masses Cardiac: IRRR; 1/6 systolic murmur, no rubs, or gallops,no edema  Respiratory:  clear to auscultation bilaterally, normal work of breathing GI: soft, nontender, nondistended, + BS MS: no deformity or atrophy1+ dependent edema is noted Skin: warm and dry, no rash Neuro:  Strength and sensation are intact Psych: euthymic mood, full affect    Recent Labs: 04/02/2014: ALT 10 05/28/2014: TSH 0.724 06/02/2014: B Natriuretic Peptide 816.0* 06/06/2014: Magnesium 1.5 06/17/2014: BUN 39*; Creatinine 1.08; Hemoglobin 10.1*; Platelets 105*; Potassium 4.0; Sodium 140    Lipid Panel    Component Value Date/Time   CHOL 259* 04/02/2014 1639   CHOL 242* 10/31/2012 1811   TRIG 138 04/02/2014 1639   HDL 98 04/02/2014 1639   HDL 84 10/31/2012 1811   CHOLHDL 2.6 04/02/2014 1639   CHOLHDL 2.9 10/31/2012 1811  VLDL 23 10/31/2012 1811   LDLCALC 133* 04/02/2014 1639   LDLCALC 135* 10/31/2012 1811      Wt Readings from Last 3 Encounters:  Aug 11, 2014 81.1 kg (178 lb 12.7 oz)  05/27/14 77.565 kg (171 lb)  04/02/14 78.926 kg (174 lb)     ASSESSMENT AND PLAN:  1.Chronic systolic heart failure: She appears well compensated.  She has gained some weight, but is eating better compared to when she was in the hospital and malnourished.  She is noted to have some lower chin.  The edema in the dependent position.  I have ordered some TED hose to be placed on in the morning, and removed at night.  Her daughter states  that when she is at home.  She keeps her feet elevated.  As recent echocardiogram dated 05/28/2014 demonstrates an EF of 25-30% with diffuse hypokinesis.  She is maintained on a low-sodium diet.  She is weighed daily.  I would not make any changes at this time in her medication regimen.  We will see her again in 3 months unless she becomes symptomatic  2. Atrial fibrillation: heart rate is well controlled currently.  She is not on anticoagulation therapy.  This is due to age and poor mobility.  To include frailty she remains on diltiazem, and Lasix along with potassium replacement.We will see her again in 3 months unless symptomatic.   Current medicines are reviewed at length with the patient today.  TNo orders of the defined types were placed in this encounter.     Disposition:   FU with me in 3 months.   Signed, Joni ReiningKathryn Lucyle Alumbaugh, NP  06/17/2014 2:26 PM    [facility] [address], [place], [place] [address] Phone: [phone]; Fax: [phone]

## 2014-06-17 NOTE — Progress Notes (Deleted)
Name: Daisy Scott    DOB: [birth year]  Age: 81 y.o.  MR#: [id number]       PCP:  Rudi Heap, MD      Insurance: Payor: [employer] MEDICARE / Plan: BCBS MEDICARE / Product Type: *No Product type* /   CC:    Chief Complaint  Patient presents with  . Atrial Fibrillation    Declined anticoagulation  . Congestive Heart Failure    Systolic    VS Filed Vitals:   06/17/14 1357  BP: 136/72  Pulse: 64  Height:  (1.6 m)  SpO2: 95%    Weights Current Weight  Jun 22, 2014 178 lb 12.7 oz (81.1 kg)  05/27/14 171 lb (77.565 kg)  04/02/14 174 lb (78.926 kg)    Blood Pressure  BP Readings from Last 3 Encounters:  06/17/14 136/72  06/22/14 132/49  05/27/14 132/83     Admit date:  (Not on file) Last encounter with RMR:  Visit date not found   Allergy Lisinopril; Sulfa antibiotics; Ciprofloxacin; and Macrobid  No current outpatient prescriptions on file.   No current facility-administered medications for this visit.    Discontinued Meds:    Medications Discontinued During This Encounter  Medication Reason  . nystatin (MYCOSTATIN) 100000 UNIT/ML suspension Error    Patient Active Problem List   Diagnosis Date Noted  . Severe left ventricular systolic dysfunction   . Protein-calorie malnutrition, severe 06/04/2014  . Acute respiratory failure 05/31/2014  . Acute bronchitis 05/29/2014  . Hypoxia 05/29/2014  . Positive D dimer 05/29/2014  . Atrial fibrillation with RVR 05/29/2014  . Cardiomyopathy 05/29/2014  . Acute exacerbation of CHF (congestive heart failure) 05/29/2014  . COPD exacerbation 05/29/2014  . Thoracic aorta atherosclerosis 05/27/2014  . CAP (community acquired pneumonia) 05/27/2014  . Angiotensin converting enzyme inhibitor-aggravated angioedema 12/25/2013  . Hyponatremia 12/25/2013  . Asthma, chronic 04/09/2013  . History of gout 04/09/2013  . Hypertension 10/31/2012  . Hyperlipemia 10/31/2012  . Hypothyroid 10/31/2012  . CKD  (chronic kidney disease), stage III 10/31/2012  . Vitamin D deficiency 10/31/2012  . History of asthma 10/31/2012    LABS    Component Value Date/Time   NA 140 06/17/2014 0700   NA 137 06/12/2014 0622   NA 136 06/09/2014 0945   NA 138 05/27/2014 1009   NA 142 04/02/2014 1639   NA 137 01/09/2014 1402   K 4.0 06/17/2014 0700   K 3.9 06/12/2014 0622   K 4.5 06/09/2014 0945   CL 103 06/17/2014 0700   CL 99 06/12/2014 0622   CL 96 06/09/2014 0945   CO2 30 06/17/2014 0700   CO2 30 06/12/2014 0622   CO2 32 06/09/2014 0945   GLUCOSE 84 06/17/2014 0700   GLUCOSE 77 06/12/2014 0622   GLUCOSE 87 06/09/2014 0945   GLUCOSE 134* 05/27/2014 1009   GLUCOSE 87 04/02/2014 1639   GLUCOSE 87 01/09/2014 1402   BUN 39* 06/17/2014 0700   BUN 42* 06/12/2014 0622   BUN 47* 06/09/2014 0945   BUN 16 05/27/2014 1009   BUN 14 04/02/2014 1639   BUN 22 01/09/2014 1402   CREATININE 1.08 06/17/2014 0700   CREATININE 1.08 06/12/2014 0622   CREATININE 1.23* 06/09/2014 0945   CREATININE 1.71* 10/31/2012 1811   CREATININE 1.87* 09/05/2012 1539   CALCIUM 8.3* 06/17/2014 0700   CALCIUM 8.6 06/12/2014 0622   CALCIUM 8.7 06/09/2014 0945   GFRNONAA 43* 06/17/2014 0700   GFRNONAA 43* 06/12/2014 0622   GFRNONAA 37* 06/09/2014 0945  GFRNONAA 26* 10/31/2012 1811   GFRNONAA 23* 09/05/2012 1539   GFRAA 50* 06/17/2014 0700   GFRAA 50* 06/12/2014 0622   GFRAA 42* 06/09/2014 0945   GFRAA 30* 10/31/2012 1811   GFRAA 27* 09/05/2012 1539   CMP     Component Value Date/Time   NA 140 06/17/2014 0700   NA 138 05/27/2014 1009   K 4.0 06/17/2014 0700   CL 103 06/17/2014 0700   CO2 30 06/17/2014 0700   GLUCOSE 84 06/17/2014 0700   GLUCOSE 134* 05/27/2014 1009   BUN 39* 06/17/2014 0700   BUN 16 05/27/2014 1009   CREATININE 1.08 06/17/2014 0700   CREATININE 1.71* 10/31/2012 1811   CALCIUM 8.3* 06/17/2014 0700   PROT 7.2 04/02/2014 1639   PROT 7.0 10/31/2012 1811   ALBUMIN 2.9* 06/05/2014 0423   AST 27  04/02/2014 1639   ALT 10 04/02/2014 1639   ALKPHOS 68 04/02/2014 1639   BILITOT 0.4 04/02/2014 1639   GFRNONAA 43* 06/17/2014 0700   GFRNONAA 26* 10/31/2012 1811   GFRAA 50* 06/17/2014 0700   GFRAA 30* 10/31/2012 1811       Component Value Date/Time   WBC 8.5 06/17/2014 0700   WBC 13.5* 06/12/2014 0622   WBC 8.0 06/04/2014 0833   WBC 7.0 05/27/2014 1023   WBC 10.0 04/02/2014 1728   WBC 8.4 12/11/2013 1520   HGB 10.1* 06/17/2014 0700   HGB 14.0 06/12/2014 0622   HGB 12.2 06/04/2014 0833   HGB 12.2 05/27/2014 1023   HGB 14.2 04/02/2014 1728   HGB 13.1 12/11/2013 1520   HCT 30.9* 06/17/2014 0700   HCT 41.4 06/12/2014 0622   HCT 36.3 06/04/2014 0833   HCT 43.9 05/27/2014 1023   HCT 43.3 04/02/2014 1728   HCT 40.0 12/11/2013 1520   MCV 92.8 06/17/2014 0700   MCV 91.4 06/12/2014 0622   MCV 91.4 06/04/2014 0833   MCV 95.2 05/27/2014 1023   MCV 96.6 04/02/2014 1728   MCV 96.7 12/11/2013 1520    Lipid Panel     Component Value Date/Time   CHOL 259* 04/02/2014 1639   CHOL 242* 10/31/2012 1811   TRIG 138 04/02/2014 1639   HDL 98 04/02/2014 1639   HDL 84 10/31/2012 1811   CHOLHDL 2.6 04/02/2014 1639   CHOLHDL 2.9 10/31/2012 1811   VLDL 23 10/31/2012 1811   LDLCALC 133* 04/02/2014 1639   LDLCALC 135* 10/31/2012 1811    ABG    Component Value Date/Time   PHART 7.478* 06/04/2014 1038   PCO2ART 41.0 06/04/2014 1038   PO2ART 79.8* 06/04/2014 1038   HCO3 30.0* 06/04/2014 1038   TCO2 26.3 06/04/2014 1038   ACIDBASEDEF 4.1* 05/31/2014 0807   O2SAT 96.0 06/04/2014 1038     Lab Results  Component Value Date   TSH 0.724 05/28/2014   BNP (last 3 results)  Recent Labs  05/31/14 0518 06/01/14 1436 06/02/14 0445  BNP 939.0* 1401.0* 816.0*    ProBNP (last 3 results) No results for input(s): PROBNP in the last 8760 hours.  Cardiac Panel (last 3 results) No results for input(s): CKTOTAL, CKMB, TROPONINI, RELINDX in the last 72 hours.  Iron/TIBC/Ferritin/ %Sat No  results found for: IRON, TIBC, FERRITIN, IRONPCTSAT   EKG Orders placed or performed during the hospital encounter of 05/27/14  . EKG 12-Lead  . EKG 12-Lead  . EKG 12-Lead  . EKG 12-Lead  . EKG 12-Lead  . EKG 12-Lead     Prior Assessment and Plan Problem List as of 06/17/2014  Cardiovascular and Mediastinum   Hypertension   Thoracic aorta atherosclerosis   Atrial fibrillation with RVR   Cardiomyopathy   Acute exacerbation of CHF (congestive heart failure)   Severe left ventricular systolic dysfunction     Respiratory   Asthma, chronic   CAP (community acquired pneumonia)   Acute bronchitis   Hypoxia   COPD exacerbation   Acute respiratory failure     Endocrine   Hypothyroid     Musculoskeletal and Integument   Angiotensin converting enzyme inhibitor-aggravated angioedema     Genitourinary   CKD (chronic kidney disease), stage III     Other   Hyperlipemia   Vitamin D deficiency   History of asthma   History of gout   Hyponatremia   Positive D dimer   Protein-calorie malnutrition, severe       Imaging: Dg Chest 2 View  05/30/2014   CLINICAL DATA:  Cough and shortness of Breath progressing over the past 4 days.  EXAM: CHEST  2 VIEW  COMPARISON:  05/27/2014  FINDINGS: The heart is enlarged. There is tortuosity, ectasia and calcification of the thoracic aorta. New bibasilar densities suspicious for pneumonia. No pulmonary edema or pleural effusion.  IMPRESSION: Suspect new bibasilar infiltrates.   Electronically Signed   By: Rudie MeyerP.  Gallerani M.D.   On: 05/30/2014 15:52   Dg Chest 2 View  05/27/2014   ADDENDUM REPORT: 05/27/2014 11:31  ADDENDUM: In the body of the report it should read no acute bony abnormality. In the impression report issued. Small bilateral pleural effusions appear. Similar findings were noted on prior chest x-ray of 1 /15/ 2015.   Electronically Signed   By: Maisie Fushomas  Register   On: 05/27/2014 11:31   05/27/2014   CLINICAL DATA:  Congestion x1 week  .  EXAM: CHEST  2 VIEW  COMPARISON:  None.  FINDINGS: Mediastinum and hilar structures are normal. Cardiomegaly with mild pulmonary vascular prominence. Bilateral small pleural effusions are noted. No focal infiltrate. No acute bony abnormality P  IMPRESSION: 1. Cardiomegaly with mild pulmonary vascular prominence. 2. Small bilateral pleural effusions appear  Electronically Signed: ByMaisie Fus: Thomas  Register On: 05/27/2014 10:32   Nm Pulmonary Perf And Vent  05/29/2014   CLINICAL DATA:  Shortness of breath  EXAM: NUCLEAR MEDICINE VENTILATION - PERFUSION LUNG SCAN  TECHNIQUE: Ventilation images were obtained in multiple projections using inhaled aerosol technetium 99 M DTPA. Perfusion images were obtained in multiple projections after intravenous injection of Tc-1414m MAA.  RADIOPHARMACEUTICALS:  42 mCi Tc-8114m DTPA aerosol and 6 mCi Tc-8114m MAA  COMPARISON:  05/27/2014.  FINDINGS: Ventilation: Ventilation images show some patchy changes likely related to COPD. Central trapping of tracer is noted as well. No large ventilation defect is noted.  Perfusion: Overlying defect from the patient's arms is noted. No large segmental perfusion defect to suggest pulmonary embolism is identified. Changes consistent with a small pleural effusion seen on recent chest x-ray are noted.  IMPRESSION: Changes consistent with COPD in a right-sided pleural effusion.  No definitive pulmonary embolism is seen.   Electronically Signed   By: Alcide CleverMark  Lukens M.D.   On: 05/29/2014 12:03   [place] Venous Img Lower Unilateral Right  05/28/2014   CLINICAL DATA:  Right leg swelling  EXAM: RIGHT LOWER EXTREMITY VENOUS DUPLEX ULTRASOUND  TECHNIQUE: Doppler venous assessment of the right lower extremity deep venous system was performed, including characterization of spectral flow, compressibility, and phasicity.  COMPARISON:  None.  FINDINGS: There is complete compressibility of the right common  femoral, femoral, and popliteal veins. Doppler analysis demonstrates  respiratory phasicity and augmentation of flow upon calf compression. No obvious calf vein thrombosis.  IMPRESSION: No evidence of right lower extremity DVT.   Electronically Signed   By: Jolaine Click M.D.   On: 05/28/2014 10:16   Dg Chest Port 1 View  06/03/2014   CLINICAL DATA:  Short of breath.  Followup abnormal lung density.  EXAM: PORTABLE CHEST - 1 VIEW  COMPARISON:  06/01/2014 and previous  FINDINGS: Right arm PICC has its tip in the SVC at the azygos level. Artifact overlies the chest elsewhere. The heart is enlarged. The aorta is unfolded. Abnormal density persists in both lower lobes consistent with atelectasis or patchy pneumonia. No new finding.  IMPRESSION: Persistent abnormal density in both lower lobes consistent with atelectasis and or pneumonia. No change.   Electronically Signed   By: Paulina Fusi M.D.   On: 06/03/2014 07:21   Dg Chest Port 1 View  06/01/2014   CLINICAL DATA:  PICC line placement. Acute respiratory failure, pneumonia, cough, CHF, COPD.  EXAM: PORTABLE CHEST - 1 VIEW  COMPARISON:  05/31/2014 and prior radiographs  FINDINGS: A right PICC line is now noted with tip overlying the mid SVC.  Cardiomegaly and pulmonary vascular congestion again noted.  Stable opacities overlying the mid and lower right lung again noted.  There is no evidence of pneumothorax.  Mild bibasilar atelectasis is present.  IMPRESSION: Right PICC line with tip overlying the mid SVC.  Otherwise unchanged chest radiograph.   Electronically Signed   By: Harmon Pier M.D.   On: 06/01/2014 13:53   Dg Chest Port 1 View  05/31/2014   CLINICAL DATA:  Shortness of breath.  EXAM: PORTABLE CHEST - 1 VIEW  COMPARISON:  05/30/2014 and 05/27/2014.  FINDINGS: There appears to be increased densities in the right hilum and medial right lung base. Heart size is upper limits of normal but minimally changed. Upper lungs appear to be clear.  IMPRESSION: Questionable densities in the right hilum and right lower lung region.  Foci of airspace disease or infection cannot be excluded. Recommend continued follow-up.   Electronically Signed   By: Richarda Overlie M.D.   On: 05/31/2014 08:20

## 2014-06-18 ENCOUNTER — Non-Acute Institutional Stay (SKILLED_NURSING_FACILITY): Payer: Medicare Other | Admitting: Internal Medicine

## 2014-06-18 ENCOUNTER — Telehealth: Payer: Self-pay | Admitting: Family Medicine

## 2014-06-18 DIAGNOSIS — J9600 Acute respiratory failure, unspecified whether with hypoxia or hypercapnia: Secondary | ICD-10-CM | POA: Diagnosis not present

## 2014-06-18 DIAGNOSIS — E038 Other specified hypothyroidism: Secondary | ICD-10-CM

## 2014-06-18 DIAGNOSIS — J441 Chronic obstructive pulmonary disease with (acute) exacerbation: Secondary | ICD-10-CM

## 2014-06-18 DIAGNOSIS — I4891 Unspecified atrial fibrillation: Secondary | ICD-10-CM | POA: Diagnosis not present

## 2014-06-18 NOTE — Telephone Encounter (Signed)
Told pt's daughter she would have to be seen to have home health come out to her home. I suggested she speak with social worker at nursing home and they could set up home health services

## 2014-06-18 NOTE — Progress Notes (Signed)
Patient ID: Daisy Scott, female   DOB: 08/10/1918, 81 y.o.   MRN: [id number]   ACILITY: [facility]                  LEVEL OF CARE:   SNF  This is a discharge note   CHIEF COMPLAINT:   Discharge note       HISTORY OF PRESENT ILLNESS:  This is a 81 year-old patient who apparently lives with family in [place].  Normally, states she walks with a walker and has some form of wheelchair.  She was admitted with acute hypoxic respiratory failure, felt to be secondary to pneumonia, COPD exacerbation, and CHF.    She was treated for community-acquired pneumonia.   X-ray showed left lower lobe greater than right lower lobe infiltrates.    She was also treated for hyponatremia with fluid restriction and Lasix.    She gradually improved with antibiotics, steroids, bronchodilators, and oxygen.  It was felt that she would need long-standing oxygen.     Also noted that she had new onset atrial fibrillation.  Family declined anticoagulation.  Her TSH was normal.    Her echocardiogram showed an EF of 25-30%, now with left bundle branch block.    Her atrial fibrillation was initially treated with beta blockers.  However, she developed bronchospasms.  Therefore, she was changed to calcium blockers even though her blood pressure was apparently fairly marginal.  She was felt to have elevated troponins secondary to demand ischemia  She continues to have significant weakness but respiratory wise appears to be stable-he does not complain of any chest pain or acute shortness of breath-she appears to require oxygen she did fail a an oxygen-free trial  Today.--Her O2 sats rations did drop--they went down to 83% on room air and back to 93% once oxygen was reapplied  Currently she has no complaints has significant weakness will need 24-hour help at home and apparently this has been arranged by family-she also will need nursing support as well as PT and OT for her weakness in numerous  comorbidities.  She also continues on fluid restrictions    Initially when she came year she did have leukocytosis but this appears to have normalized per recent lab-she is on prednisone  PAST MEDICAL HISTORY/PROBLEM LIST:                                  Acute hypoxic respiratory failure.    Acute systolic heart failure.    Presumed community-acquired pneumonia.     Acute COPD and/or asthma exacerbation.    New diagnosis of atrial fibrillation with rapid ventricular response and left bundle branch block.    Demand ischemia.  Felt to be the cause of her increased troponins.    Hypervolemic hyponatremia.    Acute renal insufficiency.     Hypothyroidism.    Obesity.    Severe malnutrition, although I do not see nutritional parameters.    CURRENT MEDICATIONS:   Discharge medications include:     Symbicort 160/4.5, 2 puffs b.i.d.     Diltiazem 240 daily.    Lasix 20 daily.    Atrovent nebulizers 0.5 q.6.    Xopenex 0.63 every 2 hours as needed p.r.n.    Mycostatin 500,000 U four times daily for seven days.    KCl 20 q.d.      Prednisone 10 q.d.      Lorazepam 0.5 q.h.s.  Allopurinol 100 mg, 2 tablets daily.    Enteric-coated aspirin 81 q.d.       Azopt ophthalmic 1 drop into the right eye daily.    Cranberry 500 daily.      Synthroid 75 daily.    Claritin 10 q.d.       Protonix 40 q.d.     Zoloft 50 mg,  tablet at bedtime.     Travatan ophthalmic 0.004%, 1 drop into the eye at bedtime.    SOCIAL HISTORY:                   FUNCTIONAL STATUS:  Apparently before hospitalization she could walk with a walker, but also has a wheelchair.   TOBACCO USE:  Non-smoker.   ALCOHOL:  Non-drinker.    FAMILY HISTORY:   Not currently available.    REVIEW OF SYSTEMS:  In general does not complain of fever chills looking forward to going home.  Skin does not complain of rashes or itching           CHEST/RESPIRATORY:  States she is not short of breath.      CARDIAC:  No chest pain.   No palpitations.   GI:  No diarrhea.  No abdominal pain.    GU:  No dysuria.    MUSCULOSKELETAL:  Lower extremities:  No pain.  However, marked weakness.  .  Neurologic does not complain of dizziness or headaches.  Psych does have a history of anxiety this appears to be relatively well controlled    PHYSICAL EXAMINATION:   Temperature 98.1 pulse 74 respirations 18 blood pressure taken manually 142/64 this appears to be relatively baseline I see systolics ranging from 101-1 40s weight is 164 this is down about 8 pounds since admission although I suspect this is desired history of CHF .   GENERAL APPEARANCE:  This patient is a 81 year-old woman who is alert and responsive, but looks very frail.  Skin is warm and dry she does have scattered bruising most noted upper extremities bilaterally this appears to be somewhat chronic  CHEST/RESPIRATORY:   clear air entry bilaterally, including both lower lobes and the right middle lobe.  There is no wheezing.  No accessory muscle use.   CARDIOVASCULAR:   CARDIAC:  Distant heart sounds from what I could hear appear to be regular irregular.  Pulse is 60.  There are no murmurs.  No gallops.  Her JVP is not elevated.   Marland Kitchen   GASTROINTESTINAL:   ABDOMEN: Soft nontender with positive bowel sounds  Musculoskeletal-did not note any deformities is able to move her upper extremities with baseline strength-she does have significant lower extremity weakness as previously noted   .   NEUROLOGICAL:   Cranial nerves are grossly intact her speech is clear has significant weakness lower extremities as previously noted.  Psych she is alert and oriented pleasant and appropriate  Labs.  06/17/2014.  CBC 8.5 hemoglobin 10.1 platelets 105.  Sodium 140 potassium 4 BUN 39 creatinine 1.08 ".    ASSESSMENT/PLAN:                                             Hypoxic respiratory failure.    This appears to be stable status post antibiotic  she does require oxygen as noted above she will need close home health support-she continues on Xopenex nebulizers routinely  4 times a day and every 2 hours when necessary also on Symbicort.--As well as Ipratri\opium   inhalers routinely    She had been on prednisone at one point which I suspect may have contributed to her leukocytosis which was transitory  .    Acute congestive heart failure in the setting of a severe presumably ischemic cardiomyopathy with an ejection fraction of 25-30%.  She has diffuse hypokinesis, including hypokinesis to akinesis of the mid to apical anteroseptal and inferolateral myocardium.  She does not appear to be currently in heart failure.   She has lost some weight I suspect this is fluid related clinically appears to be stable but a fragile individual-she is on Lasix with potassium supplementation     Acute COPD/asthma exacerbation.    stable.     New onset of atrial fibrillation.  She was not anticoagulated.  Ultimately, she did not tolerate beta blockers and was discharged on Cardizem which, at least in terms of things currently, seems to be stable a.    Hyponatremia.  This got to as low as 115.  Listed as hypervolemic.  She saw Nephrology.  She also had acute kidney injury superimposed on chronic renal failure stage III--abstinent yesterday February 29 show sodium is now 140 BUN 39 creatinine 1.08.    Hypothyroidism.  On replacement.   This was checked and was within the normal range.    Coma-continues on topical eyedrops including Xopenex and Travatan.    History of gout she is on allopurinol.    Anxiety-she is on Ativan low dose twice a day this appears to be stable.    History depression she is on Zoloft this appears to be relatively stable.    Again patient will need extensive home support she does have 24-hour help arrange will need extensive PT and OT as well as nursing support for her medical issues which are multiple as well as  expedient follow-up by primary care provider.    She does have a very supportive family.    [id number]-of note greater than 30 minutes spent on this discharge summary   Glaucoma.

## 2014-06-18 DEATH — deceased

## 2014-06-19 ENCOUNTER — Telehealth: Payer: Self-pay | Admitting: Family Medicine

## 2014-06-19 DIAGNOSIS — I4891 Unspecified atrial fibrillation: Secondary | ICD-10-CM | POA: Diagnosis not present

## 2014-06-19 DIAGNOSIS — J449 Chronic obstructive pulmonary disease, unspecified: Secondary | ICD-10-CM | POA: Diagnosis not present

## 2014-06-19 DIAGNOSIS — I5020 Unspecified systolic (congestive) heart failure: Secondary | ICD-10-CM | POA: Diagnosis not present

## 2014-06-19 NOTE — Telephone Encounter (Signed)
Patient's caregiver - Daisy Scott has question about should Daisy Scott's amlodipone.  She is unsure if she should restart.  I reviewed [facility] from [facility].  Amlodipine has been discontinued and diltiazem started due to A fib.  Daisy Scott did have diltiazem and will continue.   Daisy Scott also mentioned that patient has nebules for Atrovent and Xopenex but need a nebulizer.  I will forward this note to Home Health to get nebulizer - patient requested from [facility].

## 2014-06-20 NOTE — Telephone Encounter (Signed)
Nebulizer order sent to [facility].

## 2014-06-24 ENCOUNTER — Encounter (HOSPITAL_COMMUNITY)
Admission: RE | Admit: 2014-06-24 | Discharge: 2014-06-24 | Disposition: A | Discharge status: Home / Self Care | Payer: Medicare Other | Source: Skilled Nursing Facility | Attending: Internal Medicine | Admitting: Internal Medicine

## 2014-06-25 ENCOUNTER — Encounter: Payer: Self-pay | Admitting: Family Medicine

## 2014-07-02 ENCOUNTER — Ambulatory Visit: Payer: Medicare Other | Admitting: Family Medicine

## 2014-07-08 ENCOUNTER — Ambulatory Visit: Payer: Medicare Other | Admitting: Family Medicine

## 2014-07-08 ENCOUNTER — Encounter: Payer: Self-pay | Admitting: Family Medicine

## 2014-07-08 VITALS — BP 122/82 | HR 78 | Temp 99.0°F | Ht 63.0 in

## 2014-07-08 DIAGNOSIS — E785 Hyperlipidemia, unspecified: Secondary | ICD-10-CM

## 2014-07-08 DIAGNOSIS — J9601 Acute respiratory failure with hypoxia: Secondary | ICD-10-CM | POA: Diagnosis not present

## 2014-07-08 DIAGNOSIS — I700 Atherosclerosis of aorta: Secondary | ICD-10-CM | POA: Diagnosis not present

## 2014-07-08 DIAGNOSIS — N183 Chronic kidney disease, stage 3 (moderate): Secondary | ICD-10-CM

## 2014-07-08 DIAGNOSIS — G3184 Mild cognitive impairment, so stated: Secondary | ICD-10-CM | POA: Diagnosis not present

## 2014-07-08 DIAGNOSIS — I10 Essential (primary) hypertension: Secondary | ICD-10-CM | POA: Diagnosis not present

## 2014-07-08 DIAGNOSIS — E559 Vitamin D deficiency, unspecified: Secondary | ICD-10-CM

## 2014-07-08 DIAGNOSIS — E871 Hypo-osmolality and hyponatremia: Secondary | ICD-10-CM

## 2014-07-08 DIAGNOSIS — I129 Hypertensive chronic kidney disease with stage 1 through stage 4 chronic kidney disease, or unspecified chronic kidney disease: Secondary | ICD-10-CM

## 2014-07-08 DIAGNOSIS — N1832 Chronic kidney disease, stage 3b: Secondary | ICD-10-CM

## 2014-07-08 DIAGNOSIS — E039 Hypothyroidism, unspecified: Secondary | ICD-10-CM

## 2014-07-08 MED ORDER — POTASSIUM CHLORIDE CRYS ER 20 MEQ PO TBCR: 20.0000 meq | EXTENDED_RELEASE_TABLET | Freq: Every day | ORAL | Status: DC

## 2014-07-08 MED ORDER — BRINZOLAMIDE 1 % OP SUSP: 1.0000 [drp] | OPHTHALMIC | Status: DC

## 2014-07-08 MED ORDER — ALLOPURINOL 100 MG PO TABS: ORAL_TABLET | ORAL | Status: DC

## 2014-07-08 MED ORDER — FUROSEMIDE 20 MG PO TABS: 20.0000 mg | ORAL_TABLET | Freq: Every day | ORAL | Status: DC

## 2014-07-08 MED ORDER — LORAZEPAM 0.5 MG PO TABS: 0.5000 mg | ORAL_TABLET | Freq: Every day | ORAL | Status: DC

## 2014-07-08 MED ORDER — LEVOTHYROXINE SODIUM 75 MCG PO TABS: ORAL_TABLET | ORAL | Status: DC

## 2014-07-08 MED ORDER — SERTRALINE HCL 50 MG PO TABS: ORAL_TABLET | ORAL | Status: DC

## 2014-07-08 MED ORDER — DILTIAZEM HCL ER COATED BEADS 240 MG PO CP24: 240.0000 mg | ORAL_CAPSULE | Freq: Every day | ORAL | Status: DC

## 2014-07-08 MED ORDER — TRAVOPROST 0.004 % OP SOLN: 1.0000 [drp] | Freq: Every day | OPHTHALMIC | Status: DC

## 2014-07-08 MED ORDER — PANTOPRAZOLE SODIUM 40 MG PO TBEC: 40.0000 mg | DELAYED_RELEASE_TABLET | Freq: Every day | ORAL | Status: DC

## 2014-07-08 MED ORDER — BUDESONIDE-FORMOTEROL FUMARATE 160-4.5 MCG/ACT IN AERO: 2.0000 | INHALATION_SPRAY | Freq: Two times a day (BID) | RESPIRATORY_TRACT | Status: DC

## 2014-07-08 NOTE — Patient Instructions (Addendum)
Medicare Annual Wellness Visit  [place] and the medical providers at [facility] strive to bring you the best medical care.  In doing so we not only want to address your current medical conditions and concerns but also to detect new conditions early and prevent illness, disease and health-related problems.    Medicare offers a yearly Wellness Visit which allows our clinical staff to assess your need for preventative services including immunizations, lifestyle education, counseling to decrease risk of preventable diseases and screening for fall risk and other medical concerns.    This visit is provided free of charge (no copay) for all Medicare recipients. The clinical pharmacists at [facility] have begun to conduct these Wellness Visits which will also include a thorough review of all your medications.    As you primary medical provider recommend that you make an appointment for your Annual Wellness Visit if you have not done so already this year.  You may set up this appointment before you leave today or you may call back [phone] and schedule an appointment.  Please make sure when you call that you mention that you are scheduling your Annual Wellness Visit with the clinical pharmacist so that the appointment may be made for the proper length of time.     Continue current medications. Continue good therapeutic lifestyle changes which include good diet and exercise. Fall precautions discussed with patient. If an FOBT was given today- please return it to our front desk. If you are over 79 years old - you may need Prevnar 13 or the adult Pneumonia vaccine.  Flu Shots are still available at our office. If you still haven't had one please call to set up a nurse visit to get one.   After your visit with us today you will receive a survey in the mail or online from [employer] regarding your care with us. Please take a moment to  fill this out. Your feedback is very important to us as you can help us better understand your patient needs as well as improve your experience and satisfaction. WE CARE ABOUT YOU!!!   The patient should continue to increase her activities and should avoid exposure to people that might be sick and would recommend that she stay in the house and get her therapy in the home until she recovers even more. She is doing so well I would definitely continue her O2 at nighttime I will continue her current treatment We will call the family with the results of her lab work as soon as those results become available

## 2014-07-08 NOTE — Progress Notes (Addendum)
Subjective:    Patient ID: Daisy Scott, female    DOB: February 04, 1919, 81 y.o.   MRN: [id number]  HPI Patient was seen today in her home for a follow up on chronic medical problems and regaurding her recent hospital stay. This is also a face to face visit for in home needs. She is in her home accompanied by her full time caregiver and her daughter. She is alert and in good spirits. She appears well hydrated and well nourished. She is very pleasant and very communicative. She was able to get up and walk with her walker without problems although somebody walked with her. This patient is recovering from community acquired pneumonia, acute respiratory failure and congestive heart failure. In the hospital she was in the critical care unit and on BiPAP. When she improved in the hospital against all odds, she went to the rehabilitation center and subsequently has come home with around-the-clock care. I was extremely surprised to see her so responsive and doing so well following this recent hospitalization. She indicates that she does not remember a lot about this.         Patient Active Problem List   Diagnosis Date Noted  . Severe left ventricular systolic dysfunction   . Protein-calorie malnutrition, severe 06/04/2014  . Acute respiratory failure 05/31/2014  . Acute bronchitis 05/29/2014  . Hypoxia 05/29/2014  . Positive D dimer 05/29/2014  . Atrial fibrillation with RVR 05/29/2014  . Cardiomyopathy 05/29/2014  . Acute exacerbation of CHF (congestive heart failure) 05/29/2014  . COPD exacerbation 05/29/2014  . Thoracic aorta atherosclerosis 05/27/2014  . CAP (community acquired pneumonia) 05/27/2014  . Angiotensin converting enzyme inhibitor-aggravated angioedema 12/25/2013  . Hyponatremia 12/25/2013  . Asthma, chronic 04/09/2013  . History of gout 04/09/2013  . Hypertension 10/31/2012  . Hyperlipemia 10/31/2012  . Hypothyroid 10/31/2012  . CKD (chronic kidney disease), stage III  10/31/2012  . Vitamin D deficiency 10/31/2012  . History of asthma 10/31/2012   Outpatient Encounter Prescriptions as of 07/08/2014  Medication Sig  . allopurinol (ZYLOPRIM) 100 MG tablet TAKE TWO TABLETS BY MOUTH DAILY (Patient taking differently: Take 100 mg by mouth daily. )  . aspirin EC 81 MG tablet Take 81 mg by mouth daily.  . brinzolamide (AZOPT) 1 % ophthalmic suspension Place 1 drop into the right eye See admin instructions. Uses at 0800 and 1200.  . budesonide-formoterol (SYMBICORT) 160-4.5 MCG/ACT inhaler Inhale 2 puffs into the lungs 2 (two) times daily.  . Cranberry 500 MG CAPS Take 1 capsule by mouth daily.  Marland Kitchen diltiazem (CARDIZEM CD) 240 MG 24 hr capsule Take 1 capsule (240 mg total) by mouth daily.  . feeding supplement, ENSURE COMPLETE, (ENSURE COMPLETE) LIQD Take 237 mLs by mouth 3 (three) times daily between meals.  . furosemide (LASIX) 20 MG tablet Take 1 tablet (20 mg total) by mouth daily.  Marland Kitchen ipratropium (ATROVENT) 0.02 % nebulizer solution Take 2.5 mLs (0.5 mg total) by nebulization every 6 (six) hours.  Marland Kitchen levalbuterol (XOPENEX) 0.63 MG/3ML nebulizer solution Take 3 mLs (0.63 mg total) by nebulization every 2 (two) hours as needed for wheezing or shortness of breath.  . levalbuterol (XOPENEX) 0.63 MG/3ML nebulizer solution Take 3 mLs (0.63 mg total) by nebulization every 6 (six) hours.  Marland Kitchen levothyroxine (SYNTHROID, LEVOTHROID) 75 MCG tablet TAKE ONE TABLET EVERY MORNING (Patient taking differently: Take 75 mcg by mouth daily before breakfast. )  . loratadine (CLARITIN) 10 MG tablet Take 10 mg by mouth daily.  Marland Kitchen LORazepam (ATIVAN)  0.5 MG tablet Take 1 tablet (0.5 mg total) by mouth at bedtime. As directed  . Multiple Vitamin (MULTIVITAMIN WITH MINERALS) TABS tablet Take 1 tablet by mouth daily.  . pantoprazole (PROTONIX) 40 MG tablet Take 1 tablet (40 mg total) by mouth daily.  . potassium chloride SA (K-DUR,KLOR-CON) 20 MEQ tablet Take 1 tablet (20 mEq total) by mouth  daily.  . sertraline (ZOLOFT) 50 MG tablet TAKE 1/2 TABLET DAILY (Patient taking differently: Take 25 mg by mouth daily. )  . travoprost, benzalkonium, (TRAVATAN) 0.004 % ophthalmic solution Place 1 drop into the right eye at bedtime.  . [DISCONTINUED] predniSONE (DELTASONE) 10 MG tablet Start 2.21 . Take 40 mg by mouth daily for 3 days, then take 20 mg by mouth daily for 3 days, then take 10 mg by mouth daily for 3 days, then stop.      Review of Systems  Constitutional: Negative.   HENT: Negative.   Eyes: Negative.   Respiratory: Negative.   Cardiovascular: Negative.   Gastrointestinal: Negative.   Endocrine: Negative.   Genitourinary: Negative.   Musculoskeletal: Negative.   Skin: Negative.   Allergic/Immunologic: Negative.   Neurological: Negative.   Hematological: Negative.   Psychiatric/Behavioral: Negative.        Objective:   Physical Exam  Constitutional: She is oriented to person, place, and time. She appears well-developed and well-nourished. No distress.  Especially for her age of 81 and having been in the hospital not expecting to live and in the rehabilitation center for a period of time before she came home. She looks pretty incredible.  HENT:  Head: Normocephalic.  Right Ear: External ear normal.  Left Ear: External ear normal.  Nose: Nose normal.  Mouth/Throat: Oropharynx is clear and moist. No oropharyngeal exudate.  Eyes: Conjunctivae and EOM are normal. Pupils are equal, round, and reactive to light. Right eye exhibits no discharge. Left eye exhibits no discharge. No scleral icterus.  Neck: Normal range of motion. Neck supple. No thyromegaly present.  No carotid bruits or thyromegaly or anterior cervical adenopathy  Cardiovascular: Normal rate, regular rhythm and normal heart sounds.   No murmur heard. At 72/m, was no presacral edema  Pulmonary/Chest: Effort normal and breath sounds normal. No respiratory distress. She has no wheezes. She has no rales.  She exhibits no tenderness.  Lungs were clear anteriorly and posteriorly and without any wheezing or congestion  Abdominal: Soft. Bowel sounds are normal. She exhibits no mass. There is no tenderness. There is no rebound and no guarding.  The abdomen was soft with normal bowel sounds and no tenderness or masses  Musculoskeletal: Normal range of motion. She exhibits no edema or tenderness.  Lymphadenopathy:    She has no cervical adenopathy.  Neurological: She is alert and oriented to person, place, and time.  Skin: Skin is warm and dry. No rash noted.  Skin was in good condition and generally without any obvious breakdown.  Psychiatric: She has a normal mood and affect. Her behavior is normal. Thought content normal.  Nursing note and vitals reviewed.     BP 122/82 mmHg  Pulse 78  Temp(Src) 99 F (37.2 C) (Oral)  Ht '5\' 3"'  (1.6 m)  Wt   We did several oxygen levels today in the home. Her resting- room air - was 94% Her walking - room air- was 87% Her walking with oxygen was 96% pt as portable oxygen at this time, but only wears it at night.   About 45 minutes of  face-to-face time was spent evaluating this patient and discussing her condition with her daughter and the caregiver that were both present. I lab technician drew blood from her arm to be tested for 4 her kidney function liver function and thyroid and hemoglobin.     Assessment & Plan:  1. Thoracic aorta atherosclerosis -This is stable with no ongoing problems - CBC With Differential  2. Essential hypertension -The blood pressure is under good control with current treatment - BMP8+EGFR - Hepatic function panel - Uric acid - CBC With Differential  3. Hyperlipemia -Patient should continue eating 3 meals a day and drinking plenty of fluids which is what she is doing. - BMP8+EGFR - Hepatic function panel - CBC With Differential  4. Hypothyroidism, unspecified hypothyroidism type -We will monitor her thyroid  profile on the blood work is being done today and make sure that she is on the appropriate treatment - Thyroid Panel With TSH - CBC With Differential  5. Vitamin D deficiency -She should continue with her current diet and we will check the D level with her blood work - CBC With Differential  6. Hyponatremia -She shows no symptoms of hyponatremia and has no edema and appears to be voiding well.  7. Mild cognitive impairment with memory loss -The patient seems to be stable compared to the last office visit before her hospitalization. She responded appropriately to questions asked of her.  8. Chronic kidney disease (CKD) stage G3b/A2, moderately decreased glomerular filtration rate (GFR) between 30-44 mL/min/1.73 square meter and albuminuria creatinine ratio between 30-299 mg/g -She should continue with her current medication management and treatment  9. Benign hypertension with chronic kidney disease, stage III -The blood pressure was good today and no changes will be made in her treatment  10. Acute respiratory failure with hypoxia -This appears to be completely resolved as the patient was breathing on her own and had no problems with her activity of walking through the house. She is doing so well we would ask that she continue with her oxygen at nighttime  Meds ordered this encounter  Medications  . pantoprazole (PROTONIX) 40 MG tablet    Sig: Take 1 tablet (40 mg total) by mouth daily.    Dispense:  30 tablet    Refill:  5  . levothyroxine (SYNTHROID, LEVOTHROID) 75 MCG tablet    Sig: TAKE ONE TABLET EVERY MORNING    Dispense:  30 tablet    Refill:  5  . allopurinol (ZYLOPRIM) 100 MG tablet    Sig: TAKE TWO TABLETS BY MOUTH DAILY    Dispense:  60 tablet    Refill:  6  . diltiazem (CARDIZEM CD) 240 MG 24 hr capsule    Sig: Take 1 capsule (240 mg total) by mouth daily.    Dispense:  30 capsule    Refill:  6  . furosemide (LASIX) 20 MG tablet    Sig: Take 1 tablet (20 mg  total) by mouth daily.    Dispense:  30 tablet    Refill:  6  . potassium chloride SA (K-DUR,KLOR-CON) 20 MEQ tablet    Sig: Take 1 tablet (20 mEq total) by mouth daily.    Dispense:  30 tablet    Refill:  6  . sertraline (ZOLOFT) 50 MG tablet    Sig: TAKE 1/2 TABLET DAILY    Dispense:  30 tablet    Refill:  6  . LORazepam (ATIVAN) 0.5 MG tablet    Sig: Take 1 tablet (0.5  mg total) by mouth at bedtime. As directed    Dispense:  30 tablet    Refill:  2  . budesonide-formoterol (SYMBICORT) 160-4.5 MCG/ACT inhaler    Sig: Inhale 2 puffs into the lungs 2 (two) times daily.    Dispense:  1 Inhaler    Refill:  6  . brinzolamide (AZOPT) 1 % ophthalmic suspension    Sig: Place 1 drop into the right eye See admin instructions. Uses at 0800 and 1200.    Dispense:  10 mL    Refill:  6  . travoprost, benzalkonium, (TRAVATAN) 0.004 % ophthalmic solution    Sig: Place 1 drop into the right eye at bedtime.    Dispense:  2.5 mL    Refill:  6   Patient Instructions                       Medicare Annual Wellness Visit  [facility] and the medical providers at [facility] strive to bring you the best medical care.  In doing so we not only want to address your current medical conditions and concerns but also to detect new conditions early and prevent illness, disease and health-related problems.    Medicare offers a yearly Wellness Visit which allows our clinical staff to assess your need for preventative services including immunizations, lifestyle education, counseling to decrease risk of preventable diseases and screening for fall risk and other medical concerns.    This visit is provided free of charge (no copay) for all Medicare recipients. The clinical pharmacists at [facility] have begun to conduct these Wellness Visits which will also include a thorough review of all your medications.    As you primary medical provider recommend that you make an  appointment for your Annual Wellness Visit if you have not done so already this year.  You may set up this appointment before you leave today or you may call back ([phone]) and schedule an appointment.  Please make sure when you call that you mention that you are scheduling your Annual Wellness Visit with the clinical pharmacist so that the appointment may be made for the proper length of time.     Continue current medications. Continue good therapeutic lifestyle changes which include good diet and exercise. Fall precautions discussed with patient. If an FOBT was given today- please return it to our front desk. If you are over 79 years old - you may need Prevnar 70 or the adult Pneumonia vaccine.  Flu Shots are still available at our office. If you still haven't had one please call to set up a nurse visit to get one.   After your visit with [place] today you will receive a survey in the mail or online from [employer] regarding your care with [place]. Please take a moment to fill this out. Your feedback is very important to [place] as you can help [place] better understand your patient needs as well as improve your experience and satisfaction. WE CARE ABOUT YOU!!!   The patient should continue to increase her activities and should avoid exposure to people that might be sick and would recommend that she stay in the house and get her therapy in the home until she recovers even more. She is doing so well I would definitely continue her O2 at nighttime I will continue her current treatment We will call the family with the results of her lab work as soon as those results become available  She is to continue Nebulizer and treatments. This is a face to face visit for medicare - she is in need of oxygen and nebulizer and inhalers.    Arrie Senate MD

## 2014-07-09 LAB — CBC WITH DIFFERENTIAL
Basophils Absolute: 0.1 10*3/uL (ref 0.0–0.2)
Basos: 1 %
Eos: 0 %
Eosinophils Absolute: 0 10*3/uL (ref 0.0–0.4)
HCT: 38.2 % (ref 34.0–46.6)
Hemoglobin: 12.1 g/dL (ref 11.1–15.9)
LYMPHS: 33 %
Lymphocytes Absolute: 3.9 10*3/uL — ABNORMAL HIGH (ref 0.7–3.1)
MCH: 30.7 pg (ref 26.6–33.0)
MCHC: 31.7 g/dL (ref 31.5–35.7)
MCV: 97 fL (ref 79–97)
MONOCYTES: 7 %
MONOS ABS: 0.8 10*3/uL (ref 0.1–0.9)
Neutrophils Absolute: 6.5 10*3/uL (ref 1.4–7.0)
Neutrophils Relative %: 55 %
RBC: 3.94 x10E6/uL (ref 3.77–5.28)
RDW: 18.1 % — ABNORMAL HIGH (ref 12.3–15.4)
WBC: 11.9 10*3/uL — ABNORMAL HIGH (ref 3.4–10.8)

## 2014-07-09 LAB — THYROID PANEL WITH TSH
FREE THYROXINE INDEX: 2.1 (ref 1.2–4.9)
T3 UPTAKE RATIO: 28 % (ref 24–39)
T4, Total: 7.6 ug/dL (ref 4.5–12.0)
TSH: 1.98 u[IU]/mL (ref 0.450–4.500)

## 2014-07-09 LAB — BMP8+EGFR
BUN/Creatinine Ratio: 16 (ref 11–26)
BUN: 22 mg/dL (ref 10–36)
CALCIUM: 9.2 mg/dL (ref 8.7–10.3)
CO2: 28 mmol/L (ref 18–29)
Chloride: 100 mmol/L (ref 97–108)
Creatinine, Ser: 1.38 mg/dL — ABNORMAL HIGH (ref 0.57–1.00)
GFR calc Af Amer: 38 mL/min/{1.73_m2} — ABNORMAL LOW (ref >59)
GFR, EST NON AFRICAN AMERICAN: 33 mL/min/{1.73_m2} — AB (ref >59)
GLUCOSE: 130 mg/dL — AB (ref 65–99)
Potassium: 4.9 mmol/L (ref 3.5–5.2)
Sodium: 143 mmol/L (ref 134–144)

## 2014-07-09 LAB — HEPATIC FUNCTION PANEL
ALBUMIN: 3.4 g/dL (ref 3.2–4.6)
ALT: 16 IU/L (ref 0–32)
AST: 23 IU/L (ref 0–40)
Alkaline Phosphatase: 85 IU/L (ref 39–117)
Bilirubin Total: <0.2 mg/dL (ref 0.0–1.2)
Bilirubin, Direct: 0.09 mg/dL (ref 0.00–0.40)
TOTAL PROTEIN: 5.6 g/dL — AB (ref 6.0–8.5)

## 2014-07-09 LAB — URIC ACID: URIC ACID: 4.6 mg/dL (ref 2.5–7.1)

## 2014-07-09 LAB — IMMATURE CELLS
METAMYELOCYTES: 2 % — AB (ref 0–0)
Myelocytes: 2 % — ABNORMAL HIGH (ref 0–0)

## 2014-07-30 ENCOUNTER — Telehealth: Payer: Self-pay

## 2014-07-30 NOTE — Telephone Encounter (Signed)
Please have family contact the ophthalmologist at prescribed this medicine to see what they would recommend instead

## 2014-07-30 NOTE — Telephone Encounter (Signed)
Insurance denied prior authorization for Azopt 1% suspension eye drops

## 2014-08-16 ENCOUNTER — Other Ambulatory Visit (INDEPENDENT_AMBULATORY_CARE_PROVIDER_SITE_OTHER): Payer: Medicare Other

## 2014-08-16 DIAGNOSIS — R7989 Other specified abnormal findings of blood chemistry: Secondary | ICD-10-CM

## 2014-08-16 DIAGNOSIS — R748 Abnormal levels of other serum enzymes: Secondary | ICD-10-CM

## 2014-08-16 NOTE — Progress Notes (Signed)
Lab only 

## 2014-08-17 LAB — BMP8+EGFR
BUN/Creatinine Ratio: 18 (ref 11–26)
BUN: 26 mg/dL (ref 10–36)
CHLORIDE: 102 mmol/L (ref 97–108)
CO2: 26 mmol/L (ref 18–29)
Calcium: 8.8 mg/dL (ref 8.7–10.3)
Creatinine, Ser: 1.41 mg/dL — ABNORMAL HIGH (ref 0.57–1.00)
GFR calc Af Amer: 37 mL/min/{1.73_m2} — ABNORMAL LOW (ref >59)
GFR calc non Af Amer: 32 mL/min/{1.73_m2} — ABNORMAL LOW (ref >59)
Glucose: 124 mg/dL — ABNORMAL HIGH (ref 65–99)
POTASSIUM: 4.2 mmol/L (ref 3.5–5.2)
Sodium: 143 mmol/L (ref 134–144)

## 2014-08-19 ENCOUNTER — Telehealth: Payer: Self-pay | Admitting: *Deleted

## 2014-08-19 NOTE — Telephone Encounter (Signed)
lmtcb regarding test results. 

## 2014-08-19 NOTE — Telephone Encounter (Signed)
-----   Message from Ernestina Pennaonald W Moore, MD sent at 08/17/2014  7:23 AM EDT ----- Please call the patient's daughter, Delbert PhenixBarbara Belton with this lab result The blood sugar is elevated at 124. The creatinine, the most important kidney function test remains elevated slightly more than it was one month ago. The number is now 1.41 and ideally this should be less than 1.00. The electrolytes including potassium are within normal limits. Please remind the patient's daughter that the patient should avoid all NSAIDs, i.e. like ibuprofen and Aleve. Tylenol is okay. We will continue to monitor this. The patient is on allopurinol and sometimes this can have adverse effects on renal function and we will have our lab to add a uric acid to this lab work. In the meantime she should continue to take allopurinol until we get the uric acid back. Ask the patient's daughter if the patient is having any gout symptoms. Lab---please add a uric acid to this request+++++

## 2014-08-22 ENCOUNTER — Telehealth: Payer: Self-pay | Admitting: Family Medicine

## 2014-08-22 ENCOUNTER — Ambulatory Visit: Payer: Medicare Other | Admitting: Family Medicine

## 2014-08-22 NOTE — Telephone Encounter (Signed)
Patient's family aware.

## 2014-08-22 NOTE — Telephone Encounter (Signed)
Try to reduce Lasix to Monday Wednesday and Friday and see if that is helps any and make sure they weigh her daily

## 2014-08-22 NOTE — Telephone Encounter (Signed)
They feel like the lasix is causing her to have cramping in her hands. They want to know if she can use lasix on a as needed basis if they monitor the swelling in her ankles?

## 2014-09-04 ENCOUNTER — Other Ambulatory Visit (INDEPENDENT_AMBULATORY_CARE_PROVIDER_SITE_OTHER): Payer: Medicare Other

## 2014-09-04 ENCOUNTER — Encounter: Payer: Self-pay | Admitting: Family Medicine

## 2014-09-04 DIAGNOSIS — R300 Dysuria: Secondary | ICD-10-CM

## 2014-09-04 LAB — POCT URINALYSIS DIPSTICK
Bilirubin, UA: NEGATIVE
Glucose, UA: NEGATIVE
KETONES UA: NEGATIVE
Nitrite, UA: NEGATIVE
PH UA: 5
SPEC GRAV UA: 1.025
UROBILINOGEN UA: NEGATIVE

## 2014-09-04 LAB — POCT UA - MICROSCOPIC ONLY
CASTS, UR, LPF, POC: NEGATIVE
Crystals, Ur, HPF, POC: NEGATIVE
EPITHELIAL CELLS, URINE PER MICROSCOPY: NEGATIVE
Mucus, UA: NEGATIVE
RBC, urine, microscopic: NEGATIVE
YEAST UA: NEGATIVE

## 2014-09-04 MED ORDER — AMPICILLIN 250 MG PO CAPS: 250.0000 mg | ORAL_CAPSULE | Freq: Four times a day (QID) | ORAL | Status: DC

## 2014-09-04 NOTE — Addendum Note (Signed)
Addended by: Orma RenderHODGES, Creed Kail F on: 09/04/2014 02:51 PM   Modules accepted: Orders

## 2014-09-04 NOTE — Progress Notes (Signed)
Pt brought in urine, lab only

## 2014-09-04 NOTE — Addendum Note (Signed)
Addended by: Magdalene RiverBULLINS, Mahira Petras H on: 09/04/2014 04:23 PM   Modules accepted: Orders

## 2014-09-08 ENCOUNTER — Encounter: Payer: Self-pay | Admitting: Family Medicine

## 2014-09-09 ENCOUNTER — Other Ambulatory Visit: Payer: Self-pay | Admitting: Family Medicine

## 2014-09-09 ENCOUNTER — Ambulatory Visit (INDEPENDENT_AMBULATORY_CARE_PROVIDER_SITE_OTHER): Payer: Medicare Other

## 2014-09-09 ENCOUNTER — Encounter: Payer: Self-pay | Admitting: Family Medicine

## 2014-09-09 ENCOUNTER — Ambulatory Visit (INDEPENDENT_AMBULATORY_CARE_PROVIDER_SITE_OTHER): Payer: Medicare Other | Admitting: Family Medicine

## 2014-09-09 VITALS — BP 146/92 | HR 85 | Temp 97.2°F | Ht 63.0 in

## 2014-09-09 DIAGNOSIS — R509 Fever, unspecified: Secondary | ICD-10-CM

## 2014-09-09 DIAGNOSIS — N183 Chronic kidney disease, stage 3 unspecified: Secondary | ICD-10-CM

## 2014-09-09 DIAGNOSIS — R05 Cough: Secondary | ICD-10-CM | POA: Diagnosis not present

## 2014-09-09 DIAGNOSIS — R059 Cough, unspecified: Secondary | ICD-10-CM

## 2014-09-09 DIAGNOSIS — I129 Hypertensive chronic kidney disease with stage 1 through stage 4 chronic kidney disease, or unspecified chronic kidney disease: Secondary | ICD-10-CM

## 2014-09-09 DIAGNOSIS — N3001 Acute cystitis with hematuria: Secondary | ICD-10-CM

## 2014-09-09 LAB — POCT CBC
GRANULOCYTE PERCENT: 61 % (ref 37–80)
HEMATOCRIT: 37.7 % (ref 37.7–47.9)
Hemoglobin: 11.8 g/dL — AB (ref 12.2–16.2)
Lymph, poc: 2.5 (ref 0.6–3.4)
MCH, POC: 30.5 pg (ref 27–31.2)
MCHC: 31.3 g/dL — AB (ref 31.8–35.4)
MCV: 97.4 fL — AB (ref 80–97)
MPV: 6.5 fL (ref 0–99.8)
PLATELET COUNT, POC: 419 10*3/uL (ref 142–424)
POC GRANULOCYTE: 5.9 (ref 2–6.9)
POC LYMPH PERCENT: 25.9 %L (ref 10–50)
RBC: 3.87 M/uL — AB (ref 4.04–5.48)
RDW, POC: 14.5 %
WBC: 9.6 10*3/uL (ref 4.6–10.2)

## 2014-09-09 LAB — URINE CULTURE

## 2014-09-09 NOTE — Progress Notes (Signed)
lmtcb on Barbara's #

## 2014-09-09 NOTE — Telephone Encounter (Signed)
Last seen 09/09/14 DW[facility]  If approved route to nurse to call into [facility]

## 2014-09-09 NOTE — Progress Notes (Signed)
Subjective:    Patient ID: Daisy Scott, female    DOB: 1919/01/09, 81 y.o.   MRN: [id number]  HPI Patient here today for cough and fever that started about 6 days ago. She is accompanied today by her caregiver. The patient has recently started again with some cough and fever.     Patient Active Problem List   Diagnosis Date Noted  . Severe left ventricular systolic dysfunction   . Protein-calorie malnutrition, severe 06/04/2014  . Acute respiratory failure 05/31/2014  . Acute bronchitis 05/29/2014  . Hypoxia 05/29/2014  . Positive D dimer 05/29/2014  . Atrial fibrillation with RVR 05/29/2014  . Cardiomyopathy 05/29/2014  . Acute exacerbation of CHF (congestive heart failure) 05/29/2014  . COPD exacerbation 05/29/2014  . Thoracic aorta atherosclerosis 05/27/2014  . CAP (community acquired pneumonia) 05/27/2014  . Angiotensin converting enzyme inhibitor-aggravated angioedema 12/25/2013  . Hyponatremia 12/25/2013  . Asthma, chronic 04/09/2013  . History of gout 04/09/2013  . Hypertension 10/31/2012  . Hyperlipemia 10/31/2012  . Hypothyroid 10/31/2012  . CKD (chronic kidney disease), stage III 10/31/2012  . Vitamin D deficiency 10/31/2012  . History of asthma 10/31/2012   Outpatient Encounter Prescriptions as of 09/09/2014  Medication Sig  . allopurinol (ZYLOPRIM) 100 MG tablet TAKE TWO TABLETS BY MOUTH DAILY  . ampicillin (PRINCIPEN) 250 MG capsule Take 1 capsule (250 mg total) by mouth 4 (four) times daily.  Marland Kitchen aspirin EC 81 MG tablet Take 81 mg by mouth daily.  . brinzolamide (AZOPT) 1 % ophthalmic suspension Place 1 drop into the right eye See admin instructions. Uses at 0800 and 1200.  . budesonide-formoterol (SYMBICORT) 160-4.5 MCG/ACT inhaler Inhale 2 puffs into the lungs 2 (two) times daily.  . Cranberry 500 MG CAPS Take 1 capsule by mouth daily.  Marland Kitchen diltiazem (CARDIZEM CD) 240 MG 24 hr capsule Take 1 capsule (240 mg total) by mouth daily.  . feeding supplement,  ENSURE COMPLETE, (ENSURE COMPLETE) LIQD Take 237 mLs by mouth 3 (three) times daily between meals.  . furosemide (LASIX) 20 MG tablet Take 1 tablet (20 mg total) by mouth daily.  Marland Kitchen ipratropium (ATROVENT) 0.02 % nebulizer solution Take 2.5 mLs (0.5 mg total) by nebulization every 6 (six) hours.  Marland Kitchen levalbuterol (XOPENEX) 0.63 MG/3ML nebulizer solution Take 3 mLs (0.63 mg total) by nebulization every 2 (two) hours as needed for wheezing or shortness of breath.  . levothyroxine (SYNTHROID, LEVOTHROID) 75 MCG tablet TAKE ONE TABLET EVERY MORNING  . loratadine (CLARITIN) 10 MG tablet Take 10 mg by mouth daily.  Marland Kitchen LORazepam (ATIVAN) 0.5 MG tablet Take 1 tablet (0.5 mg total) by mouth at bedtime. As directed  . Multiple Vitamin (MULTIVITAMIN WITH MINERALS) TABS tablet Take 1 tablet by mouth daily.  . pantoprazole (PROTONIX) 40 MG tablet Take 1 tablet (40 mg total) by mouth daily.  . potassium chloride SA (K-DUR,KLOR-CON) 20 MEQ tablet Take 1 tablet (20 mEq total) by mouth daily.  . sertraline (ZOLOFT) 50 MG tablet TAKE 1/2 TABLET DAILY  . travoprost, benzalkonium, (TRAVATAN) 0.004 % ophthalmic solution Place 1 drop into the right eye at bedtime.  . [DISCONTINUED] levalbuterol (XOPENEX) 0.63 MG/3ML nebulizer solution Take 3 mLs (0.63 mg total) by nebulization every 6 (six) hours.   No facility-administered encounter medications on file as of 09/09/2014.     Review of Systems  Constitutional: Positive for fever.  HENT: Positive for congestion.   Eyes: Negative.   Respiratory: Positive for cough.   Cardiovascular: Negative.   Gastrointestinal: Negative.  Endocrine: Negative.   Genitourinary: Negative.   Musculoskeletal: Negative.   Skin: Negative.   Allergic/Immunologic: Negative.   Neurological: Negative.   Hematological: Negative.   Psychiatric/Behavioral: Negative.        Objective:   Physical Exam  Constitutional: She is oriented to person, place, and time. She appears well-developed  and well-nourished. No distress.  HENT:  Head: Normocephalic.  Right Ear: External ear normal.  Left Ear: External ear normal.  Nose: Nose normal.  Mouth/Throat: Oropharynx is clear and moist. No oropharyngeal exudate.  Eyes: Conjunctivae and EOM are normal. Pupils are equal, round, and reactive to light. Right eye exhibits no discharge. Left eye exhibits no discharge. No scleral icterus.  Neck: Normal range of motion. Neck supple. No thyromegaly present.  Cardiovascular: Normal rate, regular rhythm, normal heart sounds and intact distal pulses.  Exam reveals no gallop and no friction rub.   No murmur heard. Heart has a regular rate and rhythm at 84/m  Pulmonary/Chest: Effort normal. No respiratory distress. She has wheezes. She has no rales. She exhibits no tenderness.  The chest was clear anteriorly and posteriorly with only a rare wheeze which seem to clear with coughing. There were no rales.  Abdominal: Soft. Bowel sounds are normal. She exhibits no mass. There is no tenderness. There is no rebound and no guarding.  The abdomen was nontender to palpation.  Musculoskeletal: She exhibits no edema.  There was no edema.  Lymphadenopathy:    She has no cervical adenopathy.  Neurological: She is alert and oriented to person, place, and time.  Skin: Skin is warm and dry. No rash noted.  Psychiatric: She has a normal mood and affect. Her behavior is normal. Judgment and thought content normal.  Nursing note and vitals reviewed.  BP 146/92 mmHg  Pulse 85  Temp(Src) 97.2 F (36.2 C) (Oral)  Ht $R'5\' 3"'Cj$  (1.6 m)  Wt   Results for orders placed or performed in visit on 09/09/14  POCT CBC  Result Value Ref Range   WBC 9.6 4.6 - 10.2 K/uL   Lymph, poc 2.5 0.6 - 3.4   POC LYMPH PERCENT 25.9 10 - 50 %L   POC Granulocyte 5.9 2 - 6.9   Granulocyte percent 61.0 37 - 80 %G   RBC 3.87 (A) 4.04 - 5.48 M/uL   Hemoglobin 11.8 (A) 12.2 - 16.2 g/dL   HCT, POC 37.7 37.7 - 47.9 %   MCV 97.4 (A) 80 -  97 fL   MCH, POC 30.5 27 - 31.2 pg   MCHC 31.3 (A) 31.8 - 35.4 g/dL   RDW, POC 14.5 %   Platelet Count, POC 419 142 - 424 K/uL   MPV 6.5 0 - 99.8 fL   The CBC results were discussed with the patient and her caregiver and also her daughter will be called with these results  [facility] reading (PRIMARY) by  Dr. Timmothy Sours is no obvious pneumonia on the chest x-ray and was greatly improved from when she was in the hospital.                                        Assessment & Plan:  1. Fever and chills -No cause could be found for this today other than her continuing urinary tract infection and we will ask the family to bring a urine specimen back for recheck - POCT CBC - BMP8+EGFR - POCT  UA - Microscopic Only - POCT urinalysis dipstick - Urine culture - DG Chest 2 View; Future  2. Cough -We will ask her to take Mucinex, maximum strength, blue and white in color, 1 twice a with a large glass of water -We will also ask her to really start her nebulizer treatments until the cough is improved - POCT CBC - BMP8+EGFR - POCT UA - Microscopic Only - POCT urinalysis dipstick - Urine culture - DG Chest 2 View; Future  3. Acute cystitis with hematuria -Continue current antibiotic and recheck urinalysis either today or tomorrow clean catch midstream specimen  4. Benign hypertension with chronic kidney disease, stage III -No change in this treatment.  Patient Instructions  Continue drinking plenty of fluids Continue current antibiotic Recheck urinalysis clean catch midstream as soon as possible If the patient gets worse or her condition deteriorates she should return to the emergency room Restart nebulizer treatments and do these at least every 8 hours Take Mucinex maximum strength, blue and white in color, 1 twice daily for cough and congestion with a large glass of water   Arrie Senate MD   Arrie Senate MD

## 2014-09-09 NOTE — Patient Instructions (Signed)
Continue drinking plenty of fluids Continue current antibiotic Recheck urinalysis clean catch midstream as soon as possible If the patient gets worse or her condition deteriorates she should return to the emergency room Restart nebulizer treatments and do these at least every 8 hours Take Mucinex maximum strength, blue and white in color, 1 twice daily for cough and congestion with a large glass of water

## 2014-09-10 ENCOUNTER — Other Ambulatory Visit: Payer: Medicare Other

## 2014-09-10 LAB — POCT UA - MICROSCOPIC ONLY
Bacteria, U Microscopic: NEGATIVE
CASTS, UR, LPF, POC: NEGATIVE
CRYSTALS, UR, HPF, POC: NEGATIVE
Mucus, UA: NEGATIVE
RBC, urine, microscopic: NEGATIVE

## 2014-09-10 LAB — BMP8+EGFR
BUN/Creatinine Ratio: 18 (ref 11–26)
BUN: 20 mg/dL (ref 10–36)
CO2: 23 mmol/L (ref 18–29)
CREATININE: 1.11 mg/dL — AB (ref 0.57–1.00)
Calcium: 9.1 mg/dL (ref 8.7–10.3)
Chloride: 104 mmol/L (ref 97–108)
GFR calc Af Amer: 49 mL/min/{1.73_m2} — ABNORMAL LOW (ref >59)
GFR calc non Af Amer: 43 mL/min/{1.73_m2} — ABNORMAL LOW (ref >59)
GLUCOSE: 107 mg/dL — AB (ref 65–99)
Potassium: 4.9 mmol/L (ref 3.5–5.2)
SODIUM: 143 mmol/L (ref 134–144)

## 2014-09-10 LAB — POCT URINALYSIS DIPSTICK
Bilirubin, UA: NEGATIVE
Glucose, UA: NEGATIVE
Ketones, UA: NEGATIVE
NITRITE UA: NEGATIVE
Protein, UA: NEGATIVE
RBC UA: NEGATIVE
Spec Grav, UA: 1.01
UROBILINOGEN UA: NEGATIVE
pH, UA: 5

## 2014-09-10 NOTE — Progress Notes (Signed)
Patient Brought in a urine ordered yesterday

## 2014-09-12 LAB — URINE CULTURE

## 2014-09-12 NOTE — Telephone Encounter (Signed)
-----   Message from Ernestina Pennaonald W Moore, MD sent at 09/12/2014  7:28 AM EDT ----- The urine culture has minimal growth of bacteria and is not clinically significant-----the patient should continue and complete her current course of antibiotics----please call the patient's daughter with these results

## 2014-09-12 NOTE — Telephone Encounter (Signed)
LM,please call for result.

## 2014-09-19 ENCOUNTER — Ambulatory Visit: Payer: Medicare Other | Admitting: Cardiology

## 2014-09-20 ENCOUNTER — Other Ambulatory Visit (INDEPENDENT_AMBULATORY_CARE_PROVIDER_SITE_OTHER): Payer: Medicare Other

## 2014-09-20 DIAGNOSIS — R300 Dysuria: Secondary | ICD-10-CM

## 2014-09-20 LAB — POCT URINALYSIS DIPSTICK
BILIRUBIN UA: NEGATIVE
GLUCOSE UA: NEGATIVE
Ketones, UA: NEGATIVE
Nitrite, UA: NEGATIVE
PH UA: 5
Protein, UA: NEGATIVE
Spec Grav, UA: 1.025
Urobilinogen, UA: NEGATIVE

## 2014-09-20 LAB — POCT UA - MICROSCOPIC ONLY
BACTERIA, U MICROSCOPIC: NEGATIVE
Casts, Ur, LPF, POC: NEGATIVE
Crystals, Ur, HPF, POC: NEGATIVE
Mucus, UA: NEGATIVE
YEAST UA: NEGATIVE

## 2014-09-20 NOTE — Progress Notes (Signed)
LAB ONLY 

## 2014-09-20 NOTE — Addendum Note (Signed)
Addended by: Prescott GumLAND, Jettson Crable M on: 09/20/2014 12:17 PM   Modules accepted: Orders

## 2014-09-20 NOTE — Progress Notes (Signed)
Lab only 

## 2014-09-23 LAB — URINE CULTURE

## 2014-09-24 MED ORDER — AMOXICILLIN-POT CLAVULANATE 875-125 MG PO TABS: 1.0000 | ORAL_TABLET | Freq: Two times a day (BID) | ORAL | Status: DC

## 2014-09-24 NOTE — Addendum Note (Signed)
Addended by: Tamera PuntWRAY, WENDY S on: 09/24/2014 02:47 PM   Modules accepted: Orders

## 2014-09-25 ENCOUNTER — Ambulatory Visit (INDEPENDENT_AMBULATORY_CARE_PROVIDER_SITE_OTHER): Payer: Medicare Other | Admitting: Cardiology

## 2014-09-25 ENCOUNTER — Encounter: Payer: Self-pay | Admitting: Cardiology

## 2014-09-25 VITALS — BP 118/76 | HR 73 | Ht 63.0 in | Wt 168.0 lb

## 2014-09-25 DIAGNOSIS — I10 Essential (primary) hypertension: Secondary | ICD-10-CM | POA: Diagnosis not present

## 2014-09-25 DIAGNOSIS — I480 Paroxysmal atrial fibrillation: Secondary | ICD-10-CM

## 2014-09-25 DIAGNOSIS — I429 Cardiomyopathy, unspecified: Secondary | ICD-10-CM | POA: Diagnosis not present

## 2014-09-25 NOTE — Progress Notes (Signed)
Cardiology Office Note  Date: 09/25/2014   ID: Daisy Scott, DOB December 28, 1918, MRN [id number]  PCP: Rudi HeapMOORE, DONALD, MD  Primary Cardiologist: Nona DellSamuel McDowell, MD   Chief Complaint  Patient presents with  . PAF  . Cardiomyopathy    History of Present Illness: Daisy Scott is a 81 y.o. female that I saw as an inpatient consult back in February of this year. Last visit was with [name] NP. She continues to follow with Dr. Christell ConstantMoore for primary care. She is here today for a routine visit with 2 family members. She is back home now, has around-the-clock care from different members of her family. She seems to be doing better, reports better appetite, no palpitations or chest pain. She has had a recent urinary tract infection which is being managed by Dr. Christell ConstantMoore, also has had use of nebulizer treatments with recent cough.  Weight is down about 10 pounds compared to February. Heart rate is regular today. She continues on aspirin and Cardizem CD. Lasix dose is been cut to every other day, her weight has been stable and there has been no leg edema per report of her daughter.   Past Medical History  Diagnosis Date  . Essential hypertension   . Hypothyroidism   . Asthma   . Hyperlipidemia   . Gout   . CKD (chronic kidney disease) stage 3, GFR 30-59 ml/min   . Ascending aorta dilation     4.2 cm 2011   . Atrial fibrillation     Documented February 2016 - question paroxysmal   . Severe malnutrition   . Secondary cardiomyopathy      Current Outpatient Prescriptions  Medication Sig Dispense Refill  . allopurinol (ZYLOPRIM) 100 MG tablet TAKE TWO TABLETS BY MOUTH DAILY 60 tablet 6  . amoxicillin-clavulanate (AUGMENTIN) 875-125 MG per tablet Take 1 tablet by mouth 2 (two) times daily. 20 tablet 0  . amoxicillin-clavulanate (AUGMENTIN) 875-125 MG per tablet Take 1 tablet by mouth 2 (two) times daily.    Marland Kitchen. aspirin EC 81 MG tablet Take 81 mg by mouth daily.    . brinzolamide (AZOPT) 1  % ophthalmic suspension Place 1 drop into the right eye See admin instructions. Uses at 0800 and 1200. 10 mL 6  . budesonide-formoterol (SYMBICORT) 160-4.5 MCG/ACT inhaler Inhale 2 puffs into the lungs 2 (two) times daily. 1 Inhaler 6  . Cranberry 500 MG CAPS Take 1 capsule by mouth daily.    Marland Kitchen. diltiazem (CARDIZEM CD) 240 MG 24 hr capsule Take 1 capsule (240 mg total) by mouth daily. 30 capsule 6  . feeding supplement, ENSURE COMPLETE, (ENSURE COMPLETE) LIQD Take 237 mLs by mouth 3 (three) times daily between meals.    . furosemide (LASIX) 20 MG tablet Take 1 tablet (20 mg total) by mouth daily. 30 tablet 6  . ipratropium (ATROVENT) 0.02 % nebulizer solution Take 2.5 mLs (0.5 mg total) by nebulization every 6 (six) hours.    Marland Kitchen. levalbuterol (XOPENEX) 0.63 MG/3ML nebulizer solution Take 3 mLs (0.63 mg total) by nebulization every 2 (two) hours as needed for wheezing or shortness of breath.    . levothyroxine (SYNTHROID, LEVOTHROID) 75 MCG tablet TAKE ONE TABLET EVERY MORNING 30 tablet 5  . loratadine (CLARITIN) 10 MG tablet Take 10 mg by mouth daily.    Marland Kitchen. LORazepam (ATIVAN) 0.5 MG tablet TAKE ONE TABLET TWICE DAILY AS NEEDED 30 tablet 1  . Multiple Vitamin (MULTIVITAMIN WITH MINERALS) TABS tablet Take 1 tablet by mouth daily.    .Marland Kitchen  pantoprazole (PROTONIX) 40 MG tablet Take 1 tablet (40 mg total) by mouth daily. 30 tablet 5  . potassium chloride SA (K-DUR,KLOR-CON) 20 MEQ tablet Take 1 tablet (20 mEq total) by mouth daily. 30 tablet 6  . sertraline (ZOLOFT) 50 MG tablet TAKE 1/2 TABLET DAILY 30 tablet 6  . travoprost, benzalkonium, (TRAVATAN) 0.004 % ophthalmic solution Place 1 drop into the right eye at bedtime. 2.5 mL 6   No current facility-administered medications for this visit.    Allergies:  Lisinopril; Sulfa antibiotics; Ciprofloxacin; and Macrobid   Social History: The patient  reports that she has never smoked. She has never used smokeless tobacco. She reports that she does not drink  alcohol or use illicit drugs.   ROS:  Please see the history of present illness. Otherwise, complete review of systems is positive for none.  All other systems are reviewed and negative.   Physical Exam: VS:  BP 118/76 mmHg  Pulse 73  Ht  (1.6 m)  Wt 168 lb (76.204 kg)  BMI 29.77 kg/m2  SpO2 99%, BMI Body mass index is 29.77 kg/(m^2).  Wt Readings from Last 3 Encounters:  09/25/14 168 lb (76.204 kg)  06/02/2014 178 lb 12.7 oz (81.1 kg)  05/27/14 171 lb (77.565 kg)     General: Elderly woman, seated in wheelchair, appears comfortable at rest. HEENT: Conjunctiva and lids normal, oropharynx clear. Neck: Supple, no elevated JVP or carotid bruits, no thyromegaly. Lungs: Clear to auscultation, nonlabored breathing at rest. Cardiac: Regular rate and rhythm, no S3, soft systolic murmur, no pericardial rub. Abdomen: Soft, nontender, bowel sounds present. Extremities: No pitting edema, distal pulses 2+. Skin: Warm and dry. Musculoskeletal: No kyphosis. Neuropsychiatric: Alert and oriented x3, affect grossly appropriate.   ECG: ECG is not ordered today.  Recent Labwork: 06/02/2014: B Natriuretic Peptide 816.0* 06/06/2014: Magnesium 1.5 06/17/2014: Platelets 105* 07/08/2014: ALT 16; AST 23; TSH 1.980 09/09/2014: BUN 20; Creatinine, Ser 1.11*; Hemoglobin 11.8*; Potassium 4.9; Sodium 143     Component Value Date/Time   CHOL 259* 04/02/2014 1639   CHOL 242* 10/31/2012 1811   TRIG 138 04/02/2014 1639   HDL 98 04/02/2014 1639   HDL 84 10/31/2012 1811   CHOLHDL 2.6 04/02/2014 1639   CHOLHDL 2.9 10/31/2012 1811   VLDL 23 10/31/2012 1811   LDLCALC 133* 04/02/2014 1639   LDLCALC 135* 10/31/2012 1811    Other Studies Reviewed Today:  Echocardiogram 05/28/2014: Study Conclusions  - Procedure narrative: Transthoracic echocardiography. Image quality was adequate. The study was technically difficult. - Left ventricle: The cavity size was normal. Wall thickness was normal. Systolic  function was severely reduced. The estimated ejection fraction was in the range of 25% to 30%. Diffuse hypokinesis. Cannot exclude hypokinesis to akinesis of the mid to apical anteroseptal and mid to basal inferolateral myocardium. The study is not technically sufficient to allow evaluation of LV diastolic function. - Ventricular septum: Septal motion showed dyssynergy. - Aortic valve: Mildly to moderately calcified annulus. Trileaflet. - Mitral valve: Calcified annulus. There was mild regurgitation. - Left atrium: The atrium was mildly dilated. - Right atrium: Central venous pressure (est): 15 mm Hg. - Tricuspid valve: There was trivial regurgitation. - Pulmonary arteries: Systolic pressure could not be accurately estimated. - Pericardium, extracardiac: There was no pericardial effusion.  Impressions:  - Images are limited. Normal LV wall thickness with LVEF approximately 25-30%, septal dyssynergy noted with LBBB. Wall motion abnormalities also noted as outlined above. Indeterminate diastolic function. Mild mitral regurgitation. Trivial tricuspid regurgitation.  Unable to assess PASP, but CVP elevated.   ASSESSMENT AND PLAN:  1. Paroxysmal atrial fibrillation, maintaining sinus rhythm on examination today. She has had no significant palpitations. Plan is to continue current regimen including aspirin and Cardizem CD. Anticoagulation was not pursued after discussions with patient and family.  2. Essential hypertension, blood pressure is normal today.  3. Secondary cardiomyopathy, no active heart failure symptoms or clear evidence of volume overload. Weight is down 10 pounds from her last visit.  Current medicines were reviewed at length with the patient today.  Disposition: FU with me in 3 months.   Signed, Jonelle Sidle, MD, [facility] 09/25/2014 1:37 PM    [facility] at [facility] [address], [place], [place] [address] Phone:  [phone]; Fax: [phone]

## 2014-09-25 NOTE — Patient Instructions (Signed)
Your physician recommends that you schedule a follow-up appointment in:  3 months with Dr McDowell     Your physician recommends that you continue on your current medications as directed. Please refer to the Current Medication list given to you today.      Thank you for choosing  [facility] !         

## 2014-10-09 ENCOUNTER — Other Ambulatory Visit: Payer: Medicare Other

## 2014-10-09 DIAGNOSIS — N390 Urinary tract infection, site not specified: Secondary | ICD-10-CM

## 2014-10-09 DIAGNOSIS — R300 Dysuria: Secondary | ICD-10-CM

## 2014-10-14 ENCOUNTER — Encounter: Payer: Self-pay | Admitting: Family Medicine

## 2014-10-14 ENCOUNTER — Other Ambulatory Visit: Payer: Self-pay

## 2014-10-14 DIAGNOSIS — Z87440 Personal history of urinary (tract) infections: Secondary | ICD-10-CM

## 2014-10-14 LAB — URINE CULTURE

## 2014-10-14 NOTE — Addendum Note (Signed)
Addended by: Gwenith DailyHUDY, Manan Olmo N on: 10/14/2014 04:35 PM   Modules accepted: Orders

## 2014-10-15 ENCOUNTER — Encounter: Payer: Self-pay | Admitting: Family Medicine

## 2014-10-15 NOTE — Telephone Encounter (Signed)
So DWM, pt is having some pain and burning in the vaginal area - per sitter. Should we go ahead with shot and augmentin?  Urology order is placed.

## 2014-10-28 ENCOUNTER — Encounter: Payer: Self-pay | Admitting: *Deleted

## 2014-11-06 ENCOUNTER — Telehealth: Payer: Self-pay | Admitting: Family Medicine

## 2014-11-06 ENCOUNTER — Ambulatory Visit: Payer: Medicare Other | Admitting: Family Medicine

## 2014-11-06 NOTE — Telephone Encounter (Signed)
I spoke with patients daughter and advised that we will need to see her. Appointment given for tonight at 5:25pm.

## 2014-11-08 ENCOUNTER — Other Ambulatory Visit: Payer: Self-pay | Admitting: Family Medicine

## 2014-11-14 ENCOUNTER — Other Ambulatory Visit: Payer: Medicare Other

## 2014-11-14 ENCOUNTER — Encounter: Payer: Self-pay | Admitting: Family Medicine

## 2014-11-14 DIAGNOSIS — R300 Dysuria: Secondary | ICD-10-CM

## 2014-11-14 NOTE — Progress Notes (Signed)
Lab only 

## 2014-11-16 LAB — URINE CULTURE

## 2014-11-26 ENCOUNTER — Encounter: Payer: Self-pay | Admitting: Family Medicine

## 2014-11-28 ENCOUNTER — Telehealth: Payer: Self-pay | Admitting: *Deleted

## 2014-11-28 NOTE — Telephone Encounter (Signed)
Patients daughter called in stating that patient had a red swollen area around vagina and at the bottom of her stomach. Patients daughter states that she has recently taken 4 different antibiotics and is pretty sure its a yeast like infection. States that she has some nystatin cream that was rxd a couple years ago. Sent in RF to [facility] for nystatin cream and advised daughter if area is not looking better in 2-3 days to bring patient in to be seen

## 2014-12-09 ENCOUNTER — Encounter: Payer: Self-pay | Admitting: Family Medicine

## 2014-12-09 ENCOUNTER — Telehealth: Payer: Self-pay | Admitting: Family Medicine

## 2014-12-09 ENCOUNTER — Other Ambulatory Visit: Payer: Self-pay | Admitting: Family Medicine

## 2014-12-09 MED ORDER — FLUCONAZOLE 150 MG PO TABS: 150.0000 mg | ORAL_TABLET | Freq: Once | ORAL | Status: DC

## 2014-12-09 NOTE — Telephone Encounter (Signed)
Left message on machine that Rx was sent to pharmacy. °

## 2014-12-09 NOTE — Telephone Encounter (Signed)
Last seen 09/09/14, last filled 11/04/14. [facility]

## 2014-12-09 NOTE — Telephone Encounter (Signed)
dflucan I sent the Rx to the pharmacy.

## 2014-12-12 ENCOUNTER — Other Ambulatory Visit: Payer: Self-pay | Admitting: Family Medicine

## 2014-12-13 ENCOUNTER — Encounter: Payer: Self-pay | Admitting: Family Medicine

## 2014-12-25 ENCOUNTER — Encounter: Payer: Self-pay | Admitting: Cardiology

## 2014-12-25 ENCOUNTER — Ambulatory Visit (INDEPENDENT_AMBULATORY_CARE_PROVIDER_SITE_OTHER): Payer: Medicare Other | Admitting: Cardiology

## 2014-12-25 ENCOUNTER — Encounter: Payer: Self-pay | Admitting: Family Medicine

## 2014-12-25 VITALS — BP 124/78 | HR 84 | Ht 63.0 in | Wt 178.6 lb

## 2014-12-25 DIAGNOSIS — I480 Paroxysmal atrial fibrillation: Secondary | ICD-10-CM

## 2014-12-25 DIAGNOSIS — I429 Cardiomyopathy, unspecified: Secondary | ICD-10-CM

## 2014-12-25 NOTE — Progress Notes (Signed)
Cardiology Office Note  Date: 12/25/2014   ID: Nikki Dom, DOB 1919/01/18, MRN [id number]  PCP: Rudi Heap, MD  Primary Cardiologist: Nona Dell, MD   Chief Complaint  Patient presents with  . Atrial Fibrillation  . Cardiomyopathy    History of Present Illness: Daisy Scott is a 81 y.o. female last seen in June. She is here today with her daughter for a follow-up visit. She and her daughter agree that things are going well, no significant progression in leg edema, reports NYHA class II dyspnea. She has been compliant with her medications, has a good appetite, reports no palpitations or chest pain.  We continue conservative medical therapy for atrial fibrillation and cardiomyopathy. Based on prior discussions with family, anticoagulation is not being pursued.   Past Medical History  Diagnosis Date  . Essential hypertension   . Hypothyroidism   . Asthma   . Hyperlipidemia   . Gout   . CKD (chronic kidney disease) stage 3, GFR 30-59 ml/min   . Ascending aorta dilation     4.2 cm 2011   . Atrial fibrillation     Documented February 2016 - question paroxysmal   . Severe malnutrition   . Secondary cardiomyopathy     Current Outpatient Prescriptions  Medication Sig Dispense Refill  . allopurinol (ZYLOPRIM) 100 MG tablet TAKE TWO TABLETS BY MOUTH DAILY 60 tablet 6  . aspirin EC 81 MG tablet Take 81 mg by mouth daily.    . brinzolamide (AZOPT) 1 % ophthalmic suspension Place 1 drop into the right eye See admin instructions. Uses at 0800 and 1200. 10 mL 6  . budesonide-formoterol (SYMBICORT) 160-4.5 MCG/ACT inhaler Inhale 2 puffs into the lungs 2 (two) times daily. 1 Inhaler 6  . Cranberry 500 MG CAPS Take 1 capsule by mouth daily.    Marland Kitchen diltiazem (CARDIZEM CD) 240 MG 24 hr capsule Take 1 capsule (240 mg total) by mouth daily. 30 capsule 6  . feeding supplement, ENSURE COMPLETE, (ENSURE COMPLETE) LIQD Take 237 mLs by mouth 3 (three) times daily between meals.     . fluconazole (DIFLUCAN) 150 MG tablet Take 1 tablet (150 mg total) by mouth once. 1 tablet 0  . furosemide (LASIX) 20 MG tablet Take 1 tablet (20 mg total) by mouth daily. 30 tablet 6  . ipratropium (ATROVENT) 0.02 % nebulizer solution Take 2.5 mLs (0.5 mg total) by nebulization every 6 (six) hours.    Marland Kitchen levalbuterol (XOPENEX) 0.63 MG/3ML nebulizer solution NEBULIZE 1 VIAL EVERY 6 HOURS 75 mL 3  . levothyroxine (SYNTHROID, LEVOTHROID) 75 MCG tablet TAKE ONE TABLET EVERY MORNING 30 tablet 5  . loratadine (CLARITIN) 10 MG tablet Take 10 mg by mouth daily.    Marland Kitchen LORazepam (ATIVAN) 0.5 MG tablet TAKE ONE TABLET TWICE DAILY AS NEEDED 30 tablet 1  . Multiple Vitamin (MULTIVITAMIN WITH MINERALS) TABS tablet Take 1 tablet by mouth daily.    . pantoprazole (PROTONIX) 40 MG tablet Take 1 tablet (40 mg total) by mouth daily. 30 tablet 5  . potassium chloride SA (K-DUR,KLOR-CON) 20 MEQ tablet Take 1 tablet (20 mEq total) by mouth daily. 30 tablet 6  . sertraline (ZOLOFT) 50 MG tablet TAKE 1/2 TABLET DAILY 30 tablet 6  . travoprost, benzalkonium, (TRAVATAN) 0.004 % ophthalmic solution Place 1 drop into the right eye at bedtime. 2.5 mL 6  . trimethoprim (TRIMPEX) 100 MG tablet   10   No current facility-administered medications for this visit.    Allergies:  Lisinopril;  Sulfa antibiotics; Ciprofloxacin; and Macrobid   Social History: The patient  reports that she has never smoked. She has never used smokeless tobacco. She reports that she does not drink alcohol or use illicit drugs.   ROS:  Please see the history of present illness. Otherwise, complete review of systems is positive for decreased hearing.  All other systems are reviewed and negative.   Physical Exam: VS:  BP 124/78 mmHg  Pulse 84  Ht  (1.6 m)  Wt 178 lb 9.6 oz (81.012 kg)  BMI 31.65 kg/m2  SpO2 96%, BMI Body mass index is 31.65 kg/(m^2).  Wt Readings from Last 3 Encounters:  12/25/14 178 lb 9.6 oz (81.012 kg)  09/25/14 168  lb (76.204 kg)  29-Jun-2014 178 lb 12.7 oz (81.1 kg)     General: Elderly woman, seated in wheelchair, appears comfortable at rest. HEENT: Conjunctiva and lids normal, oropharynx clear. Neck: Supple, no elevated JVP or carotid bruits, no thyromegaly. Lungs: Clear to auscultation, nonlabored breathing at rest. Cardiac: Regular rate and rhythm, no S3, soft systolic murmur, no pericardial rub. Abdomen: Soft, nontender, bowel sounds present. Extremities: No pitting edema, distal pulses 2+. Skin: Warm and dry. Musculoskeletal: No kyphosis. Neuropsychiatric: Alert and oriented x3, affect grossly appropriate.   ECG: ECG is not ordered today.  Recent Labwork: 06/02/2014: B Natriuretic Peptide 816.0* 06/06/2014: Magnesium 1.5 06/17/2014: Platelets 105* 07/08/2014: ALT 16; AST 23; TSH 1.980 09/09/2014: BUN 20; Creatinine, Ser 1.11*; Hemoglobin 11.8*; Potassium 4.9; Sodium 143     Component Value Date/Time   CHOL 259* 04/02/2014 1639   CHOL 242* 10/31/2012 1811   TRIG 138 04/02/2014 1639   HDL 98 04/02/2014 1639   HDL 84 10/31/2012 1811   CHOLHDL 2.6 04/02/2014 1639   CHOLHDL 2.9 10/31/2012 1811   VLDL 23 10/31/2012 1811   LDLCALC 133* 04/02/2014 1639   LDLCALC 135* 10/31/2012 1811    ASSESSMENT AND PLAN:  1.  Paroxysmal to persistent atrial fibrillation. Continue heart rate control strategy, she remains on aspirin without plans to pursue anticoagulation based on previous discussions.  2.  Secondary cardiomyopathy with LVEF 25-30%. Plan is conservative medical therapy without pursuing invasive workup. She has been clinically stable on diuretic.  Current medicines were reviewed at length with the patient today.  Disposition: FU with me in 4 months.   Signed, Jonelle Sidle, MD, [facility] 12/25/2014 2:09 PM    [facility] at [facility] [address], [place], [place] [address] Phone: [phone]; Fax: [phone]

## 2014-12-25 NOTE — Patient Instructions (Signed)
Your physician recommends that you schedule a follow-up appointment in: 4 months with Dr McDowell    Your physician recommends that you continue on your current medications as directed. Please refer to the Current Medication list given to you today.    Thank you for choosing [facility] !         

## 2014-12-26 ENCOUNTER — Encounter: Payer: Self-pay | Admitting: Family Medicine

## 2014-12-27 ENCOUNTER — Ambulatory Visit (INDEPENDENT_AMBULATORY_CARE_PROVIDER_SITE_OTHER): Payer: Medicare Other | Admitting: Family Medicine

## 2014-12-27 ENCOUNTER — Ambulatory Visit (INDEPENDENT_AMBULATORY_CARE_PROVIDER_SITE_OTHER): Payer: Medicare Other

## 2014-12-27 ENCOUNTER — Encounter: Payer: Self-pay | Admitting: Family Medicine

## 2014-12-27 VITALS — BP 137/86 | HR 86 | Temp 97.6°F | Ht 63.0 in | Wt 178.0 lb

## 2014-12-27 DIAGNOSIS — M25572 Pain in left ankle and joints of left foot: Secondary | ICD-10-CM

## 2014-12-27 NOTE — Patient Instructions (Signed)
Take 1 extra strength Tylenol 4 times daily if needed for pain Use warm wet compresses 20 minutes 3 or 4 times daily with good range of motion movements Use a walker at home with arising from sitting position and then switch to wheelchair if needed We will call with the results of the x-rays from the radiologist and the lab work once that is returned

## 2014-12-27 NOTE — Progress Notes (Signed)
Subjective:    Patient ID: Geraldine Contras, female    DOB: 1918-12-13, 81 y.o.   MRN: [id number]  HPI Patient here today for severe left ankle pain. The patient comes to the visit today with 2 of her children. She is especially in a lot of pain with arising from a sitting position. There is no history of any injury.      Patient Active Problem List   Diagnosis Date Noted  . Severe left ventricular systolic dysfunction   . Protein-calorie malnutrition, severe 06/04/2014  . Acute respiratory failure 05/31/2014  . Acute bronchitis 05/29/2014  . Hypoxia 05/29/2014  . Positive D dimer 05/29/2014  . Atrial fibrillation with RVR 05/29/2014  . Cardiomyopathy 05/29/2014  . Acute exacerbation of CHF (congestive heart failure) 05/29/2014  . COPD exacerbation 05/29/2014  . Thoracic aorta atherosclerosis 05/27/2014  . CAP (community acquired pneumonia) 05/27/2014  . Angiotensin converting enzyme inhibitor-aggravated angioedema 12/25/2013  . Hyponatremia 12/25/2013  . Asthma, chronic 04/09/2013  . History of gout 04/09/2013  . Hypertension 10/31/2012  . Hyperlipemia 10/31/2012  . Hypothyroid 10/31/2012  . CKD (chronic kidney disease), stage III 10/31/2012  . Vitamin D deficiency 10/31/2012  . History of asthma 10/31/2012   Outpatient Encounter Prescriptions as of 12/27/2014  Medication Sig  . allopurinol (ZYLOPRIM) 100 MG tablet TAKE TWO TABLETS BY MOUTH DAILY  . aspirin EC 81 MG tablet Take 81 mg by mouth daily.  . brinzolamide (AZOPT) 1 % ophthalmic suspension Place 1 drop into the right eye See admin instructions. Uses at 0800 and 1200.  . budesonide-formoterol (SYMBICORT) 160-4.5 MCG/ACT inhaler Inhale 2 puffs into the lungs 2 (two) times daily.  . Cranberry 500 MG CAPS Take 1 capsule by mouth daily.  Marland Kitchen diltiazem (CARDIZEM CD) 240 MG 24 hr capsule Take 1 capsule (240 mg total) by mouth daily.  . feeding supplement, ENSURE COMPLETE, (ENSURE COMPLETE) LIQD Take 237 mLs by mouth 3  (three) times daily between meals.  . fluconazole (DIFLUCAN) 150 MG tablet Take 1 tablet (150 mg total) by mouth once.  . furosemide (LASIX) 20 MG tablet Take 1 tablet (20 mg total) by mouth daily.  Marland Kitchen ipratropium (ATROVENT) 0.02 % nebulizer solution Take 2.5 mLs (0.5 mg total) by nebulization every 6 (six) hours.  Marland Kitchen levalbuterol (XOPENEX) 0.63 MG/3ML nebulizer solution NEBULIZE 1 VIAL EVERY 6 HOURS  . levothyroxine (SYNTHROID, LEVOTHROID) 75 MCG tablet TAKE ONE TABLET EVERY MORNING  . loratadine (CLARITIN) 10 MG tablet Take 10 mg by mouth daily.  Marland Kitchen LORazepam (ATIVAN) 0.5 MG tablet TAKE ONE TABLET TWICE DAILY AS NEEDED  . Multiple Vitamin (MULTIVITAMIN WITH MINERALS) TABS tablet Take 1 tablet by mouth daily.  . nitrofurantoin (MACRODANTIN) 100 MG capsule Take 100 mg by mouth at bedtime.  . pantoprazole (PROTONIX) 40 MG tablet Take 1 tablet (40 mg total) by mouth daily.  . potassium chloride SA (K-DUR,KLOR-CON) 20 MEQ tablet Take 1 tablet (20 mEq total) by mouth daily.  . sertraline (ZOLOFT) 50 MG tablet TAKE 1/2 TABLET DAILY  . travoprost, benzalkonium, (TRAVATAN) 0.004 % ophthalmic solution Place 1 drop into the right eye at bedtime.  Marland Kitchen trimethoprim (TRIMPEX) 100 MG tablet    No facility-administered encounter medications on file as of 12/27/2014.     Review of Systems  Constitutional: Negative.   HENT: Negative.   Eyes: Negative.   Respiratory: Negative.   Cardiovascular: Negative.   Gastrointestinal: Negative.   Endocrine: Negative.   Genitourinary: Negative.   Musculoskeletal: Positive for arthralgias (severe left  ankle pain).  Skin: Negative.   Allergic/Immunologic: Negative.   Neurological: Negative.   Hematological: Negative.   Psychiatric/Behavioral: Negative.        Objective:   Physical Exam  Constitutional: She is oriented to person, place, and time. She appears well-developed and well-nourished. No distress.  HENT:  Head: Normocephalic.  Musculoskeletal: She  exhibits tenderness. She exhibits no edema.  There is some tenderness of the left ankle and slight warmth compared to the right ankle. There is no swelling.  Neurological: She is alert and oriented to person, place, and time.  Skin: Skin is warm and dry. No rash noted.  Psychiatric: She has a normal mood and affect. Her behavior is normal. Judgment and thought content normal.  Nursing note and vitals reviewed.  BP 137/86 mmHg  Pulse 86  Temp(Src) 97.6 F (36.4 C) (Oral)  Ht _0  (1.6 m)  Wt 178 lb (80.74 kg)  BMI 31.54 kg/m2  [facility] reading (PRIMARY) by  Dr. Ruffin Pyo pruritic and degenerative changes                                        Assessment & Plan:  1. Left ankle pain -There is slight warmth with minimal discomfort and tenderness with palpation. There is no obvious swelling or edema. -Use Ace bandage and do warm wet compresses for 20 minutes 3 or 4 times a day with good range of motion exercises. - DG Ankle Complete Left; Future - Sedimentation rate - Uric acid - BMP8+EGFR - Hepatic function panel - CBC with Differential/Platelet  Patient Instructions  Take 1 extra strength Tylenol 4 times daily if needed for pain Use warm wet compresses 20 minutes 3 or 4 times daily with good range of motion movements Use a walker at home with arising from sitting position and then switch to wheelchair if needed We will call with the results of the x-rays from the radiologist and the lab work once that is returned   Arrie Senate MD

## 2014-12-28 ENCOUNTER — Encounter: Payer: Self-pay | Admitting: Family Medicine

## 2014-12-28 LAB — CBC WITH DIFFERENTIAL/PLATELET
BASOS: 0 %
Basophils Absolute: 0 10*3/uL (ref 0.0–0.2)
EOS (ABSOLUTE): 0.2 10*3/uL (ref 0.0–0.4)
EOS: 3 %
HEMATOCRIT: 38.9 % (ref 34.0–46.6)
Hemoglobin: 12.5 g/dL (ref 11.1–15.9)
IMMATURE GRANULOCYTES: 0 %
Immature Grans (Abs): 0 10*3/uL (ref 0.0–0.1)
LYMPHS ABS: 2.9 10*3/uL (ref 0.7–3.1)
Lymphs: 30 %
MCH: 30.2 pg (ref 26.6–33.0)
MCHC: 32.1 g/dL (ref 31.5–35.7)
MCV: 94 fL (ref 79–97)
MONOS ABS: 1.2 10*3/uL — AB (ref 0.1–0.9)
Monocytes: 12 %
NEUTROS ABS: 5.3 10*3/uL (ref 1.4–7.0)
NEUTROS PCT: 55 %
Platelets: 343 10*3/uL (ref 150–379)
RBC: 4.14 x10E6/uL (ref 3.77–5.28)
RDW: 18.1 % — AB (ref 12.3–15.4)
WBC: 9.8 10*3/uL (ref 3.4–10.8)

## 2014-12-28 LAB — HEPATIC FUNCTION PANEL
ALBUMIN: 3.9 g/dL (ref 3.2–4.6)
ALK PHOS: 71 IU/L (ref 39–117)
ALT: 17 IU/L (ref 0–32)
AST: 24 IU/L (ref 0–40)
BILIRUBIN, DIRECT: 0.06 mg/dL (ref 0.00–0.40)
TOTAL PROTEIN: 6.5 g/dL (ref 6.0–8.5)

## 2014-12-28 LAB — BMP8+EGFR
BUN/Creatinine Ratio: 15 (ref 11–26)
BUN: 25 mg/dL (ref 10–36)
CALCIUM: 9.3 mg/dL (ref 8.7–10.3)
CO2: 21 mmol/L (ref 18–29)
CREATININE: 1.72 mg/dL — AB (ref 0.57–1.00)
Chloride: 102 mmol/L (ref 97–108)
GFR calc Af Amer: 29 mL/min/{1.73_m2} — ABNORMAL LOW (ref >59)
GFR, EST NON AFRICAN AMERICAN: 25 mL/min/{1.73_m2} — AB (ref >59)
GLUCOSE: 85 mg/dL (ref 65–99)
Potassium: 4.9 mmol/L (ref 3.5–5.2)
Sodium: 142 mmol/L (ref 134–144)

## 2014-12-28 LAB — URIC ACID: Uric Acid: 3.9 mg/dL (ref 2.5–7.1)

## 2014-12-28 LAB — SEDIMENTATION RATE: Sed Rate: 6 mm/hr (ref 0–40)

## 2015-01-01 ENCOUNTER — Other Ambulatory Visit: Payer: Self-pay | Admitting: Family Medicine

## 2015-01-09 ENCOUNTER — Other Ambulatory Visit: Payer: Self-pay | Admitting: Family Medicine

## 2015-01-09 NOTE — Telephone Encounter (Signed)
Last seen 12/27/14  DWM  If approved route to  Nurse to call into  [facility]

## 2015-01-21 ENCOUNTER — Other Ambulatory Visit: Payer: Self-pay | Admitting: Family Medicine

## 2015-02-04 ENCOUNTER — Other Ambulatory Visit: Payer: Self-pay | Admitting: *Deleted

## 2015-02-04 ENCOUNTER — Encounter: Payer: Self-pay | Admitting: Family Medicine

## 2015-02-04 ENCOUNTER — Other Ambulatory Visit (INDEPENDENT_AMBULATORY_CARE_PROVIDER_SITE_OTHER): Payer: Medicare Other

## 2015-02-04 DIAGNOSIS — R350 Frequency of micturition: Secondary | ICD-10-CM

## 2015-02-04 LAB — POCT UA - MICROSCOPIC ONLY
CASTS, UR, LPF, POC: NEGATIVE
CRYSTALS, UR, HPF, POC: NEGATIVE
MUCUS UA: NEGATIVE
YEAST UA: NEGATIVE

## 2015-02-04 LAB — POCT URINALYSIS DIPSTICK
BILIRUBIN UA: NEGATIVE
Glucose, UA: NEGATIVE
KETONES UA: NEGATIVE
Nitrite, UA: POSITIVE
PH UA: 6.5
Spec Grav, UA: 1.015
Urobilinogen, UA: NEGATIVE

## 2015-02-04 NOTE — Progress Notes (Signed)
Lab only 

## 2015-02-04 NOTE — Progress Notes (Signed)
Bring in clean catch midstream urine for urinalysis and culture and sensitivity

## 2015-02-04 NOTE — Telephone Encounter (Signed)
-----   Message from Ernestina Pennaonald W Moore, MD sent at 02/04/2015 11:08 AM EDT ----- The urinalysis has too numerous to count white blood cells and too numerous to count red blood cells. Please confirm if she is taking any prophylactic antibiotic at the present time. Make sure that were getting a urine culture and sensitivity

## 2015-02-05 ENCOUNTER — Other Ambulatory Visit: Payer: Self-pay | Admitting: *Deleted

## 2015-02-05 MED ORDER — DOXYCYCLINE HYCLATE 100 MG PO TABS: 100.0000 mg | ORAL_TABLET | Freq: Two times a day (BID) | ORAL | Status: DC

## 2015-02-06 ENCOUNTER — Other Ambulatory Visit: Payer: Self-pay | Admitting: *Deleted

## 2015-02-06 LAB — URINE CULTURE

## 2015-02-06 MED ORDER — AMOXICILLIN-POT CLAVULANATE 875-125 MG PO TABS: 1.0000 | ORAL_TABLET | Freq: Two times a day (BID) | ORAL | Status: DC

## 2015-02-07 ENCOUNTER — Encounter: Payer: Self-pay | Admitting: Family Medicine

## 2015-02-10 ENCOUNTER — Encounter: Payer: Self-pay | Admitting: Family Medicine

## 2015-02-10 ENCOUNTER — Other Ambulatory Visit: Payer: Self-pay | Admitting: Family Medicine

## 2015-02-11 NOTE — Telephone Encounter (Signed)
rx called into pharmacy

## 2015-02-11 NOTE — Telephone Encounter (Signed)
Last seen 09/09/14, last filled 01/09/15. [facility]

## 2015-02-14 ENCOUNTER — Telehealth: Payer: Self-pay | Admitting: Family Medicine

## 2015-02-14 NOTE — Telephone Encounter (Signed)
Patient's daughter called stating that patient is having some pain in the right big toe.  Patient's daughter stated that it is not red or hot.  Informed daughter that patient may take to help with pain and if it gets any worse to call us back.  Offered an appt for today but daughter states that patient can not make it in today.  Informed daughter that we are opened from 8-12 Saturday 10/29.

## 2015-02-19 ENCOUNTER — Other Ambulatory Visit: Payer: Medicare Other

## 2015-02-19 DIAGNOSIS — N390 Urinary tract infection, site not specified: Secondary | ICD-10-CM

## 2015-02-19 NOTE — Addendum Note (Signed)
Addended by: Orma RenderHODGES, Charlott Calvario F on: 02/19/2015 12:41 PM   Modules accepted: Orders

## 2015-02-21 LAB — URINE CULTURE

## 2015-02-24 ENCOUNTER — Telehealth: Payer: Self-pay | Admitting: Family Medicine

## 2015-02-24 MED ORDER — AMOXICILLIN-POT CLAVULANATE 875-125 MG PO TABS: 1.0000 | ORAL_TABLET | Freq: Two times a day (BID) | ORAL | Status: DC

## 2015-02-24 NOTE — Telephone Encounter (Signed)
Stp's daughter Britta MccreedyBarbara and she is aware rx sent to pharmacy per MD note on lab result.

## 2015-03-11 ENCOUNTER — Other Ambulatory Visit: Payer: Self-pay | Admitting: Family Medicine

## 2015-03-12 NOTE — Telephone Encounter (Signed)
Last seen 12/27/14  DWM If approved route to nurse to call into [facility]

## 2015-03-18 ENCOUNTER — Other Ambulatory Visit: Payer: Medicare Other

## 2015-03-18 DIAGNOSIS — B3749 Other urogenital candidiasis: Secondary | ICD-10-CM

## 2015-03-18 NOTE — Progress Notes (Signed)
Lab only 

## 2015-03-20 LAB — URINE CULTURE

## 2015-03-21 MED ORDER — AMOXICILLIN-POT CLAVULANATE 875-125 MG PO TABS: 1.0000 | ORAL_TABLET | Freq: Two times a day (BID) | ORAL | Status: DC

## 2015-03-21 NOTE — Addendum Note (Signed)
Addended by: Tamera PuntWRAY, WENDY S on: 03/21/2015 08:33 AM   Modules accepted: Orders

## 2015-03-26 ENCOUNTER — Other Ambulatory Visit: Payer: Self-pay | Admitting: Family Medicine

## 2015-04-01 ENCOUNTER — Other Ambulatory Visit: Payer: Self-pay | Admitting: Family Medicine

## 2015-04-02 ENCOUNTER — Other Ambulatory Visit: Payer: Medicare Other

## 2015-04-02 DIAGNOSIS — N390 Urinary tract infection, site not specified: Secondary | ICD-10-CM

## 2015-04-02 NOTE — Progress Notes (Signed)
Lab only 

## 2015-04-04 LAB — URINE CULTURE

## 2015-04-23 ENCOUNTER — Encounter: Payer: Self-pay | Admitting: Family Medicine

## 2015-04-24 ENCOUNTER — Ambulatory Visit (INDEPENDENT_AMBULATORY_CARE_PROVIDER_SITE_OTHER): Payer: Medicare Other | Admitting: Family Medicine

## 2015-04-24 ENCOUNTER — Encounter: Payer: Self-pay | Admitting: Family Medicine

## 2015-04-24 VITALS — BP 132/82 | HR 70 | Temp 97.2°F | Ht 63.0 in | Wt 176.0 lb

## 2015-04-24 DIAGNOSIS — G3184 Mild cognitive impairment, so stated: Secondary | ICD-10-CM | POA: Diagnosis not present

## 2015-04-24 DIAGNOSIS — I10 Essential (primary) hypertension: Secondary | ICD-10-CM

## 2015-04-24 DIAGNOSIS — I700 Atherosclerosis of aorta: Secondary | ICD-10-CM

## 2015-04-24 DIAGNOSIS — J4520 Mild intermittent asthma, uncomplicated: Secondary | ICD-10-CM | POA: Diagnosis not present

## 2015-04-24 NOTE — Patient Instructions (Addendum)
Drink plenty of fluids Continue oxygen nightly - as directed  Continue inhalers Monitor O2 levels periodically Call if O2 levels drop Stay well hydrated

## 2015-04-24 NOTE — Progress Notes (Signed)
Subjective:    Patient ID: Daisy Scott, female    DOB: 1919/01/26, 82 y.o.   MRN: [id number]  HPI Patient here today for face to face visit for continuation of oxygen therapy. She is doing well on the regimen that she is currently on. She is accompanied today by her daughter and 1 caregiver.       Patient Active Problem List   Diagnosis Date Noted  . Severe left ventricular systolic dysfunction   . Protein-calorie malnutrition, severe (HCC) 06/04/2014  . Acute respiratory failure (HCC) 05/31/2014  . Acute bronchitis 05/29/2014  . Hypoxia 05/29/2014  . Positive D dimer 05/29/2014  . Atrial fibrillation with RVR (HCC) 05/29/2014  . Cardiomyopathy (HCC) 05/29/2014  . Acute exacerbation of CHF (congestive heart failure) (HCC) 05/29/2014  . COPD exacerbation (HCC) 05/29/2014  . Thoracic aorta atherosclerosis (HCC) 05/27/2014  . CAP (community acquired pneumonia) 05/27/2014  . Angiotensin converting enzyme inhibitor-aggravated angioedema 12/25/2013  . Hyponatremia 12/25/2013  . Asthma, chronic 04/09/2013  . History of gout 04/09/2013  . Hypertension 10/31/2012  . Hyperlipemia 10/31/2012  . Hypothyroid 10/31/2012  . CKD (chronic kidney disease), stage III 10/31/2012  . Vitamin D deficiency 10/31/2012  . History of asthma 10/31/2012   Outpatient Encounter Prescriptions as of 04/24/2015  Medication Sig  . allopurinol (ZYLOPRIM) 100 MG tablet TAKE TWO TABLETS BY MOUTH DAILY  . aspirin EC 81 MG tablet Take 81 mg by mouth daily.  . brinzolamide (AZOPT) 1 % ophthalmic suspension Place 1 drop into the right eye See admin instructions. Uses at 0800 and 1200.  . Cranberry 500 MG CAPS Take 1 capsule by mouth daily.  Marland Kitchen. diltiazem (CARDIZEM CD) 240 MG 24 hr capsule TAKE ONE (1) CAPSULE EACH DAY  . feeding supplement, ENSURE COMPLETE, (ENSURE COMPLETE) LIQD Take 237 mLs by mouth 3 (three) times daily between meals.  . furosemide (LASIX) 20 MG tablet Take 1 tablet (20 mg total) by mouth  daily.  Marland Kitchen. ipratropium (ATROVENT) 0.02 % nebulizer solution NEBULIZE 1 VIAL EVERY 6 HOURS  . levalbuterol (XOPENEX) 0.63 MG/3ML nebulizer solution NEBULIZE 1 VIAL EVERY 6 HOURS  . levothyroxine (SYNTHROID, LEVOTHROID) 75 MCG tablet TAKE ONE TABLET EVERY MORNING  . loratadine (CLARITIN) 10 MG tablet Take 10 mg by mouth daily.  Marland Kitchen. LORazepam (ATIVAN) 0.5 MG tablet TAKE ONE TABLET TWICE DAILY AS NEEDED  . Multiple Vitamin (MULTIVITAMIN WITH MINERALS) TABS tablet Take 1 tablet by mouth daily.  . nitrofurantoin (MACRODANTIN) 100 MG capsule Take 100 mg by mouth at bedtime.  . pantoprazole (PROTONIX) 40 MG tablet TAKE ONE (1) TABLET EACH DAY  . potassium chloride SA (K-DUR,KLOR-CON) 20 MEQ tablet TAKE ONE (1) TABLET EACH DAY  . sertraline (ZOLOFT) 50 MG tablet TAKE 1/2 TABLET DAILY  . SYMBICORT 160-4.5 MCG/ACT inhaler USE 2 PUFFS TWICE DAILY  . travoprost, benzalkonium, (TRAVATAN) 0.004 % ophthalmic solution Place 1 drop into the right eye at bedtime.  Marland Kitchen. trimethoprim (TRIMPEX) 100 MG tablet   . [DISCONTINUED] amoxicillin-clavulanate (AUGMENTIN) 875-125 MG tablet Take 1 tablet by mouth 2 (two) times daily.  . [DISCONTINUED] doxycycline (VIBRA-TABS) 100 MG tablet Take 1 tablet (100 mg total) by mouth 2 (two) times daily.  . [DISCONTINUED] fluconazole (DIFLUCAN) 150 MG tablet Take 1 tablet (150 mg total) by mouth once.   No facility-administered encounter medications on file as of 04/24/2015.      Review of Systems  Constitutional: Negative.   HENT: Negative.   Eyes: Negative.   Respiratory: Positive for shortness of breath (  occasional).   Cardiovascular: Negative.   Gastrointestinal: Negative.   Endocrine: Negative.   Genitourinary: Negative.   Musculoskeletal: Negative.   Skin: Negative.   Allergic/Immunologic: Negative.   Neurological: Negative.   Hematological: Negative.   Psychiatric/Behavioral: Negative.        Objective:   Physical Exam  Constitutional: She is oriented to person,  place, and time. She appears well-developed and well-nourished. No distress.  HENT:  Head: Normocephalic and atraumatic.  Nose: Nose normal.  Eyes: Conjunctivae and EOM are normal. Pupils are equal, round, and reactive to light. Right eye exhibits no discharge. Left eye exhibits no discharge. No scleral icterus.  Neck: Normal range of motion.  Cardiovascular: Normal rate, regular rhythm and normal heart sounds.   No murmur heard. At 72/m  Pulmonary/Chest: Effort normal and breath sounds normal. No respiratory distress. She has no wheezes. She has no rales.  Lungs are clear front to back with no wheezes rales or rhonchi  Abdominal: Soft. Bowel sounds are normal. There is no tenderness. There is no rebound and no guarding.  Musculoskeletal: Normal range of motion. She exhibits no edema.  Neurological: She is alert and oriented to person, place, and time.  Skin: Skin is warm and dry. No rash noted.  Psychiatric: She has a normal mood and affect. Her behavior is normal. Judgment and thought content normal.  Nursing note and vitals reviewed.   BP 132/82 mmHg  Pulse 70  Temp(Src) 97.2 F (36.2 C) (Oral)  Ht 5\' 3"  (1.6 m)  Wt 176 lb (79.833 kg)  BMI 31.18 kg/m2  SpO2 93%       Assessment & Plan:  1. Asthma, chronic, mild intermittent, uncomplicated -Continue inhalers and oxygen especially with history of respiratory failure -Monitor O2 levels periodically at home -If oxygen level drops and remains diminished call immediately  2. Mild cognitive impairment with memory loss -Continue with fluids and general stimulation with vision and hearing and activity  3. Thoracic aorta atherosclerosis (HCC) -Continue to watch diet  4. Essential hypertension -Blood pressure is good today and patient will continue with current treatment  Patient Instructions  Drink plenty of fluids Continue oxygen nightly - as directed  Continue inhalers Monitor O2 levels periodically Call if O2 levels  drop Stay well hydrated   Nyra Capes MD

## 2015-05-07 ENCOUNTER — Other Ambulatory Visit: Payer: Self-pay | Admitting: Family Medicine

## 2015-05-07 ENCOUNTER — Other Ambulatory Visit: Payer: Self-pay | Admitting: Nurse Practitioner

## 2015-05-07 NOTE — Telephone Encounter (Signed)
Last seen 04/24/15  DWM   If approved route to nurse to call into [facility]

## 2015-05-28 ENCOUNTER — Other Ambulatory Visit: Payer: Self-pay | Admitting: Family Medicine

## 2015-06-11 ENCOUNTER — Other Ambulatory Visit: Payer: Self-pay | Admitting: Family Medicine

## 2015-07-03 ENCOUNTER — Other Ambulatory Visit: Payer: Self-pay | Admitting: Family Medicine

## 2015-07-03 NOTE — Telephone Encounter (Signed)
Last seen 04/24/15 Dr Christell ConstantMoore  If approved route to nurse to call into [facility]

## 2015-07-31 ENCOUNTER — Other Ambulatory Visit: Payer: Self-pay | Admitting: Family Medicine

## 2015-07-31 NOTE — Telephone Encounter (Signed)
Last seen 04/24/15  DWM   Last thyroid level 07/08/14

## 2015-08-12 DIAGNOSIS — Z029 Encounter for administrative examinations, unspecified: Secondary | ICD-10-CM

## 2015-08-28 ENCOUNTER — Other Ambulatory Visit: Payer: Self-pay | Admitting: Family Medicine

## 2015-09-10 ENCOUNTER — Other Ambulatory Visit: Payer: Self-pay | Admitting: Family Medicine

## 2015-10-15 ENCOUNTER — Other Ambulatory Visit: Payer: Self-pay | Admitting: Family Medicine

## 2015-10-17 ENCOUNTER — Other Ambulatory Visit: Payer: Self-pay | Admitting: Family Medicine

## 2015-10-17 NOTE — Telephone Encounter (Signed)
It is okay to refill these even though I did not prescribe him originally. Make sure that she sees the ophthalmologist regularly that prescribe these medicines.

## 2015-10-23 ENCOUNTER — Other Ambulatory Visit: Payer: Self-pay | Admitting: Family Medicine

## 2015-10-24 ENCOUNTER — Ambulatory Visit (INDEPENDENT_AMBULATORY_CARE_PROVIDER_SITE_OTHER): Payer: Medicare Other | Admitting: Family Medicine

## 2015-10-24 ENCOUNTER — Encounter: Payer: Self-pay | Admitting: Family Medicine

## 2015-10-24 ENCOUNTER — Ambulatory Visit (INDEPENDENT_AMBULATORY_CARE_PROVIDER_SITE_OTHER): Payer: Medicare Other

## 2015-10-24 VITALS — BP 129/71 | HR 83 | Temp 97.3°F | Ht 63.0 in | Wt 198.0 lb

## 2015-10-24 DIAGNOSIS — R05 Cough: Secondary | ICD-10-CM

## 2015-10-24 DIAGNOSIS — R059 Cough, unspecified: Secondary | ICD-10-CM

## 2015-10-24 MED ORDER — AZITHROMYCIN 250 MG PO TABS: ORAL_TABLET | ORAL | Status: DC

## 2015-10-24 NOTE — Patient Instructions (Signed)
Great to meet you!  I am sending azithromycin to your pharmacy to treat you for COPD or asthma exacerbation.   Lets see you again  In about 1 week to make sure you are getting better.   Acute Bronchitis Bronchitis is when the airways that extend from the windpipe into the lungs get red, puffy, and painful (inflamed). Bronchitis often causes thick spit (mucus) to develop. This leads to a cough. A cough is the most common symptom of bronchitis. In acute bronchitis, the condition usually begins suddenly and goes away over time (usually in 2 weeks). Smoking, allergies, and asthma can make bronchitis worse. Repeated episodes of bronchitis may cause more lung problems. HOME CARE  Rest.  Drink enough fluids to keep your pee (urine) clear or pale yellow (unless you need to limit fluids as told by your doctor).  Only take over-the-counter or prescription medicines as told by your doctor.  Avoid smoking and secondhand smoke. These can make bronchitis worse. If you are a smoker, think about using nicotine gum or skin patches. Quitting smoking will help your lungs heal faster.  Reduce the chance of getting bronchitis again by:  Washing your hands often.  Avoiding people with cold symptoms.  Trying not to touch your hands to your mouth, nose, or eyes.  Follow up with your doctor as told. GET HELP IF: Your symptoms do not improve after 1 week of treatment. Symptoms include:  Cough.  Fever.  Coughing up thick spit.  Body aches.  Chest congestion.  Chills.  Shortness of breath.  Sore throat. GET HELP RIGHT AWAY IF:   You have an increased fever.  You have chills.  You have severe shortness of breath.  You have bloody thick spit (sputum).  You throw up (vomit) often.  You lose too much body fluid (dehydration).  You have a severe headache.  You faint. MAKE SURE YOU:   Understand these instructions.  Will watch your condition.  Will get help right away if you are  not doing well or get worse.   This information is not intended to replace advice given to you by your health care provider. Make sure you discuss any questions you have with your health care provider.   Document Released: 09/22/2007 Document Revised: 12/06/2012 Document Reviewed: 09/26/2012 Elsevier Interactive Patient Education [employer]2016 [employer]

## 2015-10-24 NOTE — Progress Notes (Signed)
   HPI  Patient presents today here with cough.  Patient states that she's had cough congestion, and decreased appetite for about a week. He denies any fever, malaise, or chills. She does have mild intermittent dyspnea. She also has orthopnea.  She states that her cough is productive of thick sputum early in the morning. It resolves throughout the day.  She has history of CHF with ejection fraction of 25%. Her fluid pill still gives good diuresis.  PMH: Smoking status noted ROS: Per HPI  Objective: BP 129/71 mmHg  Pulse 83  Temp(Src) 97.3 F (36.3 C) (Oral)  Ht 5\' 3"  (1.6 m)  Wt 198 lb (89.812 kg)  BMI 35.08 kg/m2  SpO2 95% Gen: NAD, alert, cooperative with exam HEENT: NCAT CV: RRR, good S1/S2, no murmur Resp: CTABL, no wheezes, non-labored Ext: 1+ pitting edema on the right, no edema on the left Neuro: Alert and oriented, No gross deficits   CXR - cardiomegaly, no infiltrate, blunted costophrenic angles, appears the same as previously.  Awaiting official read  Assessment and plan:  # Cough Treating as COPD exacerbation, very suspicious of volume overload but no signs on CXR and her legs are not very edemetous Azithro RTC in 1 week for re-check .       Orders Placed This Encounter  Procedures  . DG Chest 2 View    Standing Status: Future     Number of Occurrences:      Standing Expiration Date: 12/24/2016    Order Specific Question:  Reason for Exam (SYMPTOM  OR DIAGNOSIS REQUIRED)    Answer:  cough, CHF and Asthma Hx    Order Specific Question:  Preferred imaging location?    Answer:  External    No orders of the defined types were placed in this encounter.    Murtis SinkSam Reniyah Gootee, MD [facility] 10/24/2015, 3:16 PM

## 2015-10-26 ENCOUNTER — Encounter: Payer: Self-pay | Admitting: Family Medicine

## 2015-10-30 ENCOUNTER — Ambulatory Visit (INDEPENDENT_AMBULATORY_CARE_PROVIDER_SITE_OTHER): Payer: Medicare Other | Admitting: Family Medicine

## 2015-10-30 ENCOUNTER — Encounter: Payer: Self-pay | Admitting: Family Medicine

## 2015-10-30 VITALS — BP 109/70 | HR 89 | Temp 96.8°F | Ht 63.0 in | Wt 196.2 lb

## 2015-10-30 DIAGNOSIS — N183 Chronic kidney disease, stage 3 unspecified: Secondary | ICD-10-CM

## 2015-10-30 DIAGNOSIS — N390 Urinary tract infection, site not specified: Secondary | ICD-10-CM | POA: Diagnosis not present

## 2015-10-30 DIAGNOSIS — R0600 Dyspnea, unspecified: Secondary | ICD-10-CM

## 2015-10-30 LAB — URINALYSIS, COMPLETE
Bilirubin, UA: NEGATIVE
Glucose, UA: NEGATIVE
Ketones, UA: NEGATIVE
Nitrite, UA: NEGATIVE
PH UA: 5.5 (ref 5.0–7.5)
RBC, UA: NEGATIVE
Specific Gravity, UA: 1.015 (ref 1.005–1.030)
Urobilinogen, Ur: 0.2 mg/dL (ref 0.2–1.0)

## 2015-10-30 LAB — MICROSCOPIC EXAMINATION

## 2015-10-30 NOTE — Patient Instructions (Signed)
Great to see you!  Come back to see Dr. Christell ConstantMoore or myself in 1 week  Take lasix 1 pill twice daily, 8 am and 2 pm.   You may increase her oxygen to 4 L if needed to maintain O2 above 90%

## 2015-10-30 NOTE — Progress Notes (Signed)
   HPI  Patient presents today here with persistent cough and shortness of breath.  Patient started azithromycin by myself last week and the cough has improved, however she seems to be acting a little bit different than usual, more confusion usual. She also seems to have increased shortness of breath and is requiring oxygen 24 hours a day rather than only at night now.  She denies any chest pain. They have not had any fevers, chills, sweats. She's tolerating food and fluids normally.  Her current Lasix is causing good diuresis. Her daughter is very concerned that she has had 20 pound weight gain in the last 6 months, however she has not increased her by mouth intake.  Her daughter would like a repeat urine considering that she's had several UTIs and is acting more confused.  Whenever they take her oxygen off to help her go to the bathroom her oxygen saturations go down to 86 and 87% until he oxygen's placed back on.  PMH: Smoking status noted ROS: Per HPI  Objective: BP 109/70 mmHg  Pulse 89  Temp(Src) 96.8 F (36 C) (Oral)  Ht '5\' 3"'$  (1.6 m)  Wt 196 lb 3.2 oz (88.996 kg)  BMI 34.76 kg/m2  SpO2 89% Gen: NAD, alert, cooperative with exam HEENT: NCAT CV: RRR, good S1/S2, no murmur Resp: CTABL, no wheezes, non-labored Ext: Right greater than left lower extremity edema, no left lower extremity edema, 1+ pitting on the right, stable from last visit Neuro: Alert and oriented, No gross deficits  Assessment and plan:  # Dyspnea, cough, confusion Patient with possible multifactorial dyspnea. Azithromycin has helped her cough, however I feel that she is worsened because she needs oxygen now. Increase Lasix to twice daily dosing for 1 week and then follow-up. Checking labs today with CK D stage III, CMP and BNP to help [place] clarify what's going on.  Urinalysis today, discussed likelihood of asymptomatic bacteriuria, she does not have any symptoms of UTI Used WBC to help me decide, also  culture.  Note the patient has CHF with last ejection fraction measured at 25%. She is having some orthopnea although it's unclear whether this is stable or not.  Return to clinic in 1 week unless needed sooner.  CK D stage III Labs today  Frequent UTIs Recheck urine- rest possible asymptomatic bacteriuria   Orders Placed This Encounter  Procedures  . Urinalysis, Complete  . CMP14+EGFR  . CBC with Differential  . Brain natriuretic peptide    Laroy Apple, MD [facility] 10/30/2015, 3:44 PM

## 2015-10-31 ENCOUNTER — Other Ambulatory Visit: Payer: Self-pay | Admitting: Family Medicine

## 2015-10-31 LAB — CBC WITH DIFFERENTIAL/PLATELET
BASOS ABS: 0 10*3/uL (ref 0.0–0.2)
Basos: 0 %
EOS (ABSOLUTE): 0.1 10*3/uL (ref 0.0–0.4)
Eos: 1 %
Hematocrit: 39.7 % (ref 34.0–46.6)
Hemoglobin: 12.9 g/dL (ref 11.1–15.9)
Immature Grans (Abs): 0.1 10*3/uL (ref 0.0–0.1)
Immature Granulocytes: 1 %
LYMPHS ABS: 2.1 10*3/uL (ref 0.7–3.1)
LYMPHS: 17 %
MCH: 31.4 pg (ref 26.6–33.0)
MCHC: 32.5 g/dL (ref 31.5–35.7)
MCV: 97 fL (ref 79–97)
Monocytes Absolute: 1 10*3/uL — ABNORMAL HIGH (ref 0.1–0.9)
Monocytes: 8 %
NEUTROS ABS: 9.2 10*3/uL — AB (ref 1.4–7.0)
Neutrophils: 73 %
PLATELETS: 373 10*3/uL (ref 150–379)
RBC: 4.11 x10E6/uL (ref 3.77–5.28)
RDW: 15.4 % (ref 12.3–15.4)
WBC: 12.4 10*3/uL — AB (ref 3.4–10.8)

## 2015-10-31 LAB — CMP14+EGFR
A/G RATIO: 1.2 (ref 1.2–2.2)
ALK PHOS: 90 IU/L (ref 39–117)
ALT: 16 IU/L (ref 0–32)
AST: 36 IU/L (ref 0–40)
Albumin: 3.7 g/dL (ref 3.2–4.6)
BILIRUBIN TOTAL: 0.2 mg/dL (ref 0.0–1.2)
BUN/Creatinine Ratio: 15 (ref 12–28)
BUN: 25 mg/dL (ref 10–36)
CALCIUM: 9.5 mg/dL (ref 8.7–10.3)
CHLORIDE: 94 mmol/L — AB (ref 96–106)
CO2: 20 mmol/L (ref 18–29)
Creatinine, Ser: 1.63 mg/dL — ABNORMAL HIGH (ref 0.57–1.00)
GFR calc Af Amer: 31 mL/min/{1.73_m2} — ABNORMAL LOW (ref >59)
GFR calc non Af Amer: 27 mL/min/{1.73_m2} — ABNORMAL LOW (ref >59)
GLOBULIN, TOTAL: 3.2 g/dL (ref 1.5–4.5)
Glucose: 109 mg/dL — ABNORMAL HIGH (ref 65–99)
POTASSIUM: 5.5 mmol/L — AB (ref 3.5–5.2)
SODIUM: 133 mmol/L — AB (ref 134–144)
Total Protein: 6.9 g/dL (ref 6.0–8.5)

## 2015-10-31 LAB — BRAIN NATRIURETIC PEPTIDE: BNP: 911.3 pg/mL — AB (ref 0.0–100.0)

## 2015-11-02 ENCOUNTER — Encounter: Payer: Self-pay | Admitting: Family Medicine

## 2015-11-02 ENCOUNTER — Encounter (HOSPITAL_COMMUNITY): Payer: Self-pay | Admitting: *Deleted

## 2015-11-02 ENCOUNTER — Emergency Department (HOSPITAL_COMMUNITY)
Admission: EM | Admit: 2015-11-02 | Discharge: 2015-11-02 | Disposition: A | Discharge status: Home / Self Care | Payer: Medicare Other | Attending: Emergency Medicine | Admitting: Emergency Medicine

## 2015-11-02 ENCOUNTER — Emergency Department (HOSPITAL_COMMUNITY): Payer: Medicare Other

## 2015-11-02 DIAGNOSIS — I130 Hypertensive heart and chronic kidney disease with heart failure and stage 1 through stage 4 chronic kidney disease, or unspecified chronic kidney disease: Secondary | ICD-10-CM | POA: Insufficient documentation

## 2015-11-02 DIAGNOSIS — Z7982 Long term (current) use of aspirin: Secondary | ICD-10-CM | POA: Insufficient documentation

## 2015-11-02 DIAGNOSIS — E785 Hyperlipidemia, unspecified: Secondary | ICD-10-CM | POA: Insufficient documentation

## 2015-11-02 DIAGNOSIS — I509 Heart failure, unspecified: Secondary | ICD-10-CM | POA: Diagnosis not present

## 2015-11-02 DIAGNOSIS — J45909 Unspecified asthma, uncomplicated: Secondary | ICD-10-CM | POA: Diagnosis not present

## 2015-11-02 DIAGNOSIS — E039 Hypothyroidism, unspecified: Secondary | ICD-10-CM | POA: Diagnosis not present

## 2015-11-02 DIAGNOSIS — K449 Diaphragmatic hernia without obstruction or gangrene: Secondary | ICD-10-CM | POA: Insufficient documentation

## 2015-11-02 DIAGNOSIS — R609 Edema, unspecified: Secondary | ICD-10-CM | POA: Insufficient documentation

## 2015-11-02 DIAGNOSIS — R1084 Generalized abdominal pain: Secondary | ICD-10-CM | POA: Diagnosis present

## 2015-11-02 DIAGNOSIS — I4891 Unspecified atrial fibrillation: Secondary | ICD-10-CM | POA: Insufficient documentation

## 2015-11-02 DIAGNOSIS — J449 Chronic obstructive pulmonary disease, unspecified: Secondary | ICD-10-CM | POA: Insufficient documentation

## 2015-11-02 DIAGNOSIS — Z79899 Other long term (current) drug therapy: Secondary | ICD-10-CM | POA: Insufficient documentation

## 2015-11-02 DIAGNOSIS — R0600 Dyspnea, unspecified: Secondary | ICD-10-CM | POA: Diagnosis not present

## 2015-11-02 DIAGNOSIS — N183 Chronic kidney disease, stage 3 (moderate): Secondary | ICD-10-CM | POA: Insufficient documentation

## 2015-11-02 HISTORY — DX: Chronic obstructive pulmonary disease, unspecified: J44.9

## 2015-11-02 HISTORY — DX: Heart failure, unspecified: I50.9

## 2015-11-02 LAB — URINALYSIS, ROUTINE W REFLEX MICROSCOPIC
BILIRUBIN URINE: NEGATIVE
GLUCOSE, UA: NEGATIVE mg/dL
HGB URINE DIPSTICK: NEGATIVE
Ketones, ur: NEGATIVE mg/dL
Leukocytes, UA: NEGATIVE
Nitrite: NEGATIVE
PROTEIN: NEGATIVE mg/dL
Specific Gravity, Urine: 1.02 (ref 1.005–1.030)
pH: 5.5 (ref 5.0–8.0)

## 2015-11-02 LAB — COMPREHENSIVE METABOLIC PANEL
ALT: 24 U/L (ref 14–54)
AST: 33 U/L (ref 15–41)
Albumin: 3.6 g/dL (ref 3.5–5.0)
Alkaline Phosphatase: 78 U/L (ref 38–126)
Anion gap: 9 (ref 5–15)
BILIRUBIN TOTAL: 0.4 mg/dL (ref 0.3–1.2)
BUN: 27 mg/dL — AB (ref 6–20)
CALCIUM: 9.2 mg/dL (ref 8.9–10.3)
CO2: 25 mmol/L (ref 22–32)
CREATININE: 1.91 mg/dL — AB (ref 0.44–1.00)
Chloride: 94 mmol/L — ABNORMAL LOW (ref 101–111)
GFR calc Af Amer: 25 mL/min — ABNORMAL LOW (ref >60)
GFR, EST NON AFRICAN AMERICAN: 21 mL/min — AB (ref >60)
Glucose, Bld: 124 mg/dL — ABNORMAL HIGH (ref 65–99)
POTASSIUM: 4.9 mmol/L (ref 3.5–5.1)
Sodium: 128 mmol/L — ABNORMAL LOW (ref 135–145)
TOTAL PROTEIN: 7.1 g/dL (ref 6.5–8.1)

## 2015-11-02 LAB — CBC WITH DIFFERENTIAL/PLATELET
BASOS ABS: 0 10*3/uL (ref 0.0–0.1)
BASOS PCT: 0 %
EOS ABS: 0 10*3/uL (ref 0.0–0.7)
EOS PCT: 0 %
HCT: 40.6 % (ref 36.0–46.0)
Hemoglobin: 13.5 g/dL (ref 12.0–15.0)
LYMPHS PCT: 8 %
Lymphs Abs: 1.3 10*3/uL (ref 0.7–4.0)
MCH: 31.3 pg (ref 26.0–34.0)
MCHC: 33.3 g/dL (ref 30.0–36.0)
MCV: 94 fL (ref 78.0–100.0)
Monocytes Absolute: 1.2 10*3/uL — ABNORMAL HIGH (ref 0.1–1.0)
Monocytes Relative: 8 %
Neutro Abs: 12.9 10*3/uL — ABNORMAL HIGH (ref 1.7–7.7)
Neutrophils Relative %: 84 %
PLATELETS: 336 10*3/uL (ref 150–400)
RBC: 4.32 MIL/uL (ref 3.87–5.11)
RDW: 15.3 % (ref 11.5–15.5)
WBC: 15.4 10*3/uL — AB (ref 4.0–10.5)

## 2015-11-02 LAB — TROPONIN I: TROPONIN I: 0.06 ng/mL — AB (ref <0.03)

## 2015-11-02 LAB — LACTIC ACID, PLASMA: LACTIC ACID, VENOUS: 1.2 mmol/L (ref 0.5–1.9)

## 2015-11-02 LAB — LIPASE, BLOOD: LIPASE: 18 U/L (ref 11–51)

## 2015-11-02 MED ORDER — FAMOTIDINE 20 MG PO TABS: 20.0000 mg | ORAL_TABLET | Freq: Two times a day (BID) | ORAL | Status: AC

## 2015-11-02 MED ORDER — ONDANSETRON HCL 4 MG/2ML IJ SOLN
4.0000 mg | Freq: Once | INTRAMUSCULAR | Status: AC
  Administered 2015-11-02: 4 mg via INTRAVENOUS
  Filled 2015-11-02: qty 2

## 2015-11-02 MED ORDER — METOLAZONE 2.5 MG PO TABS: 2.5000 mg | ORAL_TABLET | Freq: Every day | ORAL | Status: DC

## 2015-11-02 MED ORDER — SODIUM CHLORIDE 0.9 % IV SOLN
Freq: Once | INTRAVENOUS | Status: AC
  Administered 2015-11-02: 13:00:00 via INTRAVENOUS

## 2015-11-02 NOTE — ED Notes (Signed)
When attempting to d/c pt, began feeling nauseated.  MD made aware and awaiting new orders.

## 2015-11-02 NOTE — ED Notes (Signed)
CRITICAL VALUE ALERT  Critical value received:  Troponin 0.06 Date of notification:  11/02/15  Time of notification:  1321  Critical value read back:Yes.    Nurse who received alert:  Berdine DanceMandi Duffy Dantonio RN  MD notified (1st page):  Dr. Particia NearingHaviland  Time of first page:  1321  MD notified (2nd page):  Time of second page:  Responding MD:  Dr. Particia NearingHaviland  Time MD responded:  [phone]

## 2015-11-02 NOTE — ED Provider Notes (Signed)
CSN: [id number]     Arrival date & time 11/02/15  1219 History   First MD Initiated Contact with Patient 11/02/15 1221     Chief Complaint  Patient presents with  . Abdominal Pain  Pt is a 82 yo wf who was brought here by EMS with a 3 day history of weakness and abdominal pain.  The pt is unable to give much hx.  She denies any current abdominal pain, but did tell the nurse that she was nauseous.  The pt did see her pcp on 7/13 for confusion and was put on abx for a UTI.  The pt was also on zithromax last week for bronchitis.  She wears O2 normally just at night, but has been requiring it throughout the day.  Family is now here and pt's daughter said that pt has a very difficult time breathing this morning.  She also said that pt has been complaining of her abdomen hurting her.  Pt has also had more diarrhea than nl.  (Consider location/radiation/quality/duration/timing/severity/associated sxs/prior Treatment) Patient is a 82 y.o. female presenting with abdominal pain. The history is provided by the patient and the EMS personnel.  Abdominal Pain Pain location:  Generalized Associated symptoms: nausea     Past Medical History  Diagnosis Date  . Essential hypertension   . Hypothyroidism   . Asthma   . Hyperlipidemia   . Gout   . CKD (chronic kidney disease) stage 3, GFR 30-59 ml/min   . Ascending aorta dilation (HCC)     4.2 cm 2011   . Atrial fibrillation [facility])     Documented February 2016 - question paroxysmal   . Severe malnutrition (HCC)   . Secondary cardiomyopathy (HCC)   . COPD (chronic obstructive pulmonary disease) (HCC)   . CHF (congestive heart failure) [facility])    Past Surgical History  Procedure Laterality Date  . Rotator cuff repair     Family History  Problem Relation Age of Onset  . Family history unknown: Yes   Social History  Substance Use Topics  . Smoking status: Never Smoker   . Smokeless tobacco: Never Used  . Alcohol Use: No   OB History    No data  available     Review of Systems  Gastrointestinal: Positive for nausea and abdominal pain.  All other systems reviewed and are negative.     Allergies  Lisinopril; Sulfa antibiotics; Ciprofloxacin; and Macrobid  Home Medications   Prior to Admission medications   Medication Sig Start Date End Date Taking? Authorizing Provider  allopurinol (ZYLOPRIM) 100 MG tablet TAKE TWO TABLETS BY MOUTH DAILY 10/15/15  Yes Ernestina Penna, MD  aspirin EC 81 MG tablet Take 81 mg by mouth daily.   Yes Historical Provider, MD  AZOPT 1 % ophthalmic suspension INSTILL ONE DROP IN RIGHT EYE TWICE DAILY AS DIRECTED 10/17/15  Yes Ernestina Penna, MD  brimonidine (ALPHAGAN) 0.2 % ophthalmic solution Place 1 drop into both eyes daily.   Yes Historical Provider, MD  Cranberry 500 MG CAPS Take 1 capsule by mouth daily.   Yes Historical Provider, MD  diltiazem (CARDIZEM CD) 240 MG 24 hr capsule TAKE ONE (1) CAPSULE EACH DAY 07/04/15  Yes Frederica Kuster, MD  feeding supplement, ENSURE COMPLETE, (ENSURE COMPLETE) LIQD Take 237 mLs by mouth 3 (three) times daily between meals. 06/09/2014  Yes Standley Brooking, MD  furosemide (LASIX) 20 MG tablet TAKE ONE (1) TABLET EACH DAY 05/07/15  Yes Ernestina Penna, MD  ipratropium (ATROVENT) 0.02 % nebulizer solution NEBULIZE 1 VIAL EVERY 6 HOURS 07/31/15  Yes Ernestina Penna, MD  levalbuterol Pauline Aus) 0.63 MG/3ML nebulizer solution NEBULIZE 1 VIAL EVERY 6 HOURS 10/24/15  Yes Ernestina Penna, MD  levothyroxine (SYNTHROID, LEVOTHROID) 75 MCG tablet TAKE ONE (1) TABLET EACH DAY 08/29/15  Yes Ernestina Penna, MD  loratadine (CLARITIN) 10 MG tablet Take 10 mg by mouth daily.   Yes Historical Provider, MD  LORazepam (ATIVAN) 0.5 MG tablet TAKE ONE TABLET TWICE DAILY AS NEEDED 07/04/15  Yes Frederica Kuster, MD  Multiple Vitamin (MULTIVITAMIN WITH MINERALS) TABS tablet Take 1 tablet by mouth daily.   Yes Historical Provider, MD  pantoprazole (PROTONIX) 40 MG tablet TAKE ONE (1) TABLET EACH DAY  07/04/15  Yes Frederica Kuster, MD  potassium chloride SA (K-DUR,KLOR-CON) 20 MEQ tablet TAKE ONE (1) TABLET EACH DAY 07/04/15  Yes Frederica Kuster, MD  sertraline (ZOLOFT) 50 MG tablet TAKE 1/2 TABLET DAILY 10/15/15  Yes Ernestina Penna, MD  SYMBICORT 160-4.5 MCG/ACT inhaler USE 2 PUFFS TWICE DAILY 10/31/15  Yes Ernestina Penna, MD  TRAVATAN Z 0.004 % SOLN ophthalmic solution PLACE 1 DROP IN RIGHT EYE AT BEDTIME 10/17/15  Yes Ernestina Penna, MD  trimethoprim (TRIMPEX) 100 MG tablet Take 100 mg by mouth daily.  12/12/14  Yes Historical Provider, MD  famotidine (PEPCID) 20 MG tablet Take 1 tablet (20 mg total) by mouth 2 (two) times daily. 11/02/15   Jacalyn Lefevre, MD  metolazone (ZAROXOLYN) 2.5 MG tablet Take 1 tablet (2.5 mg total) by mouth daily. 11/02/15   Jacalyn Lefevre, MD   BP 128/91 mmHg  Pulse 86  Temp(Src) 98.2 F (36.8 C) (Oral)  Resp 22  Ht 5\' 4"  (1.626 m)  Wt 196 lb (88.905 kg)  BMI 33.63 kg/m2  SpO2 94% Physical Exam  Constitutional: She appears well-developed and well-nourished.  HENT:  Head: Normocephalic and atraumatic.  Right Ear: External ear normal.  Left Ear: External ear normal.  Nose: Nose normal.  Mouth/Throat: Oropharynx is clear and moist.  Eyes: Conjunctivae and EOM are normal. Pupils are equal, round, and reactive to light.  Neck: Normal range of motion. Neck supple.  Cardiovascular: Normal rate, regular rhythm, normal heart sounds and intact distal pulses.   Pulmonary/Chest: Effort normal and breath sounds normal.  Abdominal: Soft. Bowel sounds are normal.  Musculoskeletal: She exhibits edema.  Neurological: She is alert.  Skin: Skin is warm and dry.  Psychiatric: She has a normal mood and affect. Her behavior is normal. Judgment and thought content normal.  Nursing note and vitals reviewed.   ED Course  Procedures (including critical care time) Labs Review Labs Reviewed  COMPREHENSIVE METABOLIC PANEL - Abnormal; Notable for the following:    Sodium 128  (*)    Chloride 94 (*)    Glucose, Bld 124 (*)    BUN 27 (*)    Creatinine, Ser 1.91 (*)    GFR calc non Af Amer 21 (*)    GFR calc Af Amer 25 (*)    All other components within normal limits  CBC WITH DIFFERENTIAL/PLATELET - Abnormal; Notable for the following:    WBC 15.4 (*)    Neutro Abs 12.9 (*)    Monocytes Absolute 1.2 (*)    All other components within normal limits  TROPONIN I - Abnormal; Notable for the following:    Troponin I 0.06 (*)    All other components within normal limits  LACTIC ACID,  PLASMA  LIPASE, BLOOD  URINALYSIS, ROUTINE W REFLEX MICROSCOPIC (NOT AT [facility])  LACTIC ACID, PLASMA   Troponin trends out nl for pt Imaging Review Ct Abdomen Pelvis Wo Contrast  11/02/2015  CLINICAL DATA:  Abdominal pain and generalized weakness for 3 days. EXAM: CT CHEST, ABDOMEN AND PELVIS WITHOUT CONTRAST TECHNIQUE: Multidetector CT imaging of the chest, abdomen and pelvis was performed following the standard protocol without IV contrast. COMPARISON:  Two-view chest x-ray 10/24/2015. CT chest without contrast 02/02/2010 FINDINGS: CT CHEST FINDINGS Mediastinum/Lymph Nodes: Calcifications are present along the ascending aorta. Mild dilation has not significantly progressed. Maximal diameter is 3.8 cm on the coronal images. The heart is mildly enlarged without significant change coronary artery calcifications are present. No significant mediastinal or axillary adenopathy is present. A moderate-sized hiatal hernia is present. Calcifications are present in the trachea. There is no significant mediastinal or axillary adenopathy. The pulmonary arteries are mildly enlarged without significant change. Lungs/Pleura: Chronic interstitial coarsening is present at the lung bases bilaterally. Ill-defined inferior airspace disease present in the lingula. There scattered ground-glass attenuation in the right middle lobe and right lower lobe. No significant pericardial effusion is present. Musculoskeletal:  Inferior endplate compression fracture is present at T8. Approximately 60% loss of height is evident relative to the adjacent level. There slight retropulsion of bone along the inferior endplate which extends into the central canal. Focal kyphosis is present at this level as well. CT ABDOMEN PELVIS FINDINGS Hepatobiliary: The liver is unremarkable. There is no significant nodularity or mass lesion. The common bile duct is within normal limits. The gallbladder is unremarkable. Focal density at the fundus the gallbladder appears calcified and may be related to prior infection. This is exophytic. Pancreas: The pancreas is unremarkable. Spleen: Spleen is unremarkable. Adrenals/Urinary Tract: Multiple exophytic low-density lesions within both kidneys are compatible with simple cyst. There is no stone or obstruction. The ureters and urinary bladder are within normal limits. Stomach/Bowel: A moderate-sized hiatal hernia is present. The stomach and duodenum are otherwise unremarkable. The small bowel is within normal limits. The distal small bowel is mostly collapsed. The appendix is not discretely visualized and may be surgically absent. The ascending and transverse colon are within normal limits. The descending colon is unremarkable. Diverticular present throughout the sigmoid colon without inflammation to suggest diverticulitis. Vascular/Lymphatic: Atherosclerotic calcifications are present within the abdominal aorta and branch vessels without aneurysm. No significant adenopathy is present. Reproductive: Hysterectomy is noted. The ovaries are visualized and within normal limits for age. Other: No significant free fluid is present. Musculoskeletal: Levoconvex curvature is present in the lumbar spine centered at L3-4. There is rightward curvature at the thoracolumbar junction. Vacuum discs are present throughout the lumbar spine. Slight retrolisthesis is present on the right at L2-3. Minimal anterolisthesis is present at  L4-5. Osseous foraminal narrowing is present on the right at L2-3 and L3-4. No focal lytic or blastic lesions are present. IMPRESSION: 1. Ill-defined airspace disease in the lingula likely reflects atelectasis. Early infection is not excluded. 2. Atherosclerosis of the thoracic aorta without aneurysm. 3. Inferior endplate compression fracture at T8 without evidence for infection. 4. Moderate-sized hiatal hernia 5. Coronary artery disease. 6. Mild cardiomegaly without failure. 7. Sigmoid diverticulosis without diverticulitis. 8. Hysterectomy. 9. Multilevel spondylosis in the lumbar spine. Electronically Signed   By: Marin Roberts M.D.   On: 11/02/2015 14:18   Ct Chest Wo Contrast  11/02/2015  CLINICAL DATA:  Abdominal pain and generalized weakness for 3 days. EXAM: CT CHEST, ABDOMEN  AND PELVIS WITHOUT CONTRAST TECHNIQUE: Multidetector CT imaging of the chest, abdomen and pelvis was performed following the standard protocol without IV contrast. COMPARISON:  Two-view chest x-ray 10/24/2015. CT chest without contrast 02/02/2010 FINDINGS: CT CHEST FINDINGS Mediastinum/Lymph Nodes: Calcifications are present along the ascending aorta. Mild dilation has not significantly progressed. Maximal diameter is 3.8 cm on the coronal images. The heart is mildly enlarged without significant change coronary artery calcifications are present. No significant mediastinal or axillary adenopathy is present. A moderate-sized hiatal hernia is present. Calcifications are present in the trachea. There is no significant mediastinal or axillary adenopathy. The pulmonary arteries are mildly enlarged without significant change. Lungs/Pleura: Chronic interstitial coarsening is present at the lung bases bilaterally. Ill-defined inferior airspace disease present in the lingula. There scattered ground-glass attenuation in the right middle lobe and right lower lobe. No significant pericardial effusion is present. Musculoskeletal: Inferior  endplate compression fracture is present at T8. Approximately 60% loss of height is evident relative to the adjacent level. There slight retropulsion of bone along the inferior endplate which extends into the central canal. Focal kyphosis is present at this level as well. CT ABDOMEN PELVIS FINDINGS Hepatobiliary: The liver is unremarkable. There is no significant nodularity or mass lesion. The common bile duct is within normal limits. The gallbladder is unremarkable. Focal density at the fundus the gallbladder appears calcified and may be related to prior infection. This is exophytic. Pancreas: The pancreas is unremarkable. Spleen: Spleen is unremarkable. Adrenals/Urinary Tract: Multiple exophytic low-density lesions within both kidneys are compatible with simple cyst. There is no stone or obstruction. The ureters and urinary bladder are within normal limits. Stomach/Bowel: A moderate-sized hiatal hernia is present. The stomach and duodenum are otherwise unremarkable. The small bowel is within normal limits. The distal small bowel is mostly collapsed. The appendix is not discretely visualized and may be surgically absent. The ascending and transverse colon are within normal limits. The descending colon is unremarkable. Diverticular present throughout the sigmoid colon without inflammation to suggest diverticulitis. Vascular/Lymphatic: Atherosclerotic calcifications are present within the abdominal aorta and branch vessels without aneurysm. No significant adenopathy is present. Reproductive: Hysterectomy is noted. The ovaries are visualized and within normal limits for age. Other: No significant free fluid is present. Musculoskeletal: Levoconvex curvature is present in the lumbar spine centered at L3-4. There is rightward curvature at the thoracolumbar junction. Vacuum discs are present throughout the lumbar spine. Slight retrolisthesis is present on the right at L2-3. Minimal anterolisthesis is present at L4-5.  Osseous foraminal narrowing is present on the right at L2-3 and L3-4. No focal lytic or blastic lesions are present. IMPRESSION: 1. Ill-defined airspace disease in the lingula likely reflects atelectasis. Early infection is not excluded. 2. Atherosclerosis of the thoracic aorta without aneurysm. 3. Inferior endplate compression fracture at T8 without evidence for infection. 4. Moderate-sized hiatal hernia 5. Coronary artery disease. 6. Mild cardiomegaly without failure. 7. Sigmoid diverticulosis without diverticulitis. 8. Hysterectomy. 9. Multilevel spondylosis in the lumbar spine. Electronically Signed   By: Marin Robertshristopher  Mattern M.D.   On: 11/02/2015 14:18   I have personally reviewed and evaluated these images and lab results as part of my medical decision-making.   EKG Interpretation None      MDM  Pt's hiatal hernia is likely the cause of abdominal pain.  I will add pepcid to pt's GI regimen.  The pt also has some peripheral edema despite daughter increasing lasix.  I will add zaroxolyn at 2.5 mg.  Pt has not  had any diarrhea here and no colitis on ct scan.  Lactic acid is nl and no sign of infection.  Pt will be discharged home with instructions to f/u with pcp. Final diagnoses:  Hiatal hernia  Peripheral edema      Jacalyn Lefevre, MD 11/02/15 1500

## 2015-11-02 NOTE — ED Notes (Signed)
Pt here for abd pain and generalized weakness for past 3 days.  [facility] and was clear but was started on antibiotics.  Denies pain

## 2015-11-04 ENCOUNTER — Other Ambulatory Visit: Payer: Self-pay | Admitting: Family Medicine

## 2015-11-04 ENCOUNTER — Encounter: Payer: Self-pay | Admitting: Family Medicine

## 2015-11-04 MED ORDER — LORAZEPAM 0.5 MG PO TABS: ORAL_TABLET | ORAL | Status: AC

## 2015-11-04 NOTE — Telephone Encounter (Signed)
Last thyroid labwork March 2016

## 2015-11-04 NOTE — Telephone Encounter (Signed)
Last seen 10/30/15  Dr Ermalinda MemosBradshaw  Last thyroid level 07/08/14   [facility] PCP  If approved route to nurse to call into [facility]

## 2015-11-05 ENCOUNTER — Ambulatory Visit (INDEPENDENT_AMBULATORY_CARE_PROVIDER_SITE_OTHER): Payer: Medicare Other | Admitting: Pediatrics

## 2015-11-05 ENCOUNTER — Encounter: Payer: Self-pay | Admitting: Pediatrics

## 2015-11-05 VITALS — BP 122/70 | HR 77 | Temp 98.0°F

## 2015-11-05 DIAGNOSIS — N183 Chronic kidney disease, stage 3 unspecified: Secondary | ICD-10-CM

## 2015-11-05 DIAGNOSIS — R410 Disorientation, unspecified: Secondary | ICD-10-CM | POA: Diagnosis not present

## 2015-11-05 DIAGNOSIS — R0602 Shortness of breath: Secondary | ICD-10-CM | POA: Diagnosis not present

## 2015-11-05 DIAGNOSIS — I10 Essential (primary) hypertension: Secondary | ICD-10-CM | POA: Diagnosis not present

## 2015-11-05 DIAGNOSIS — M25471 Effusion, right ankle: Secondary | ICD-10-CM | POA: Diagnosis not present

## 2015-11-05 DIAGNOSIS — R531 Weakness: Secondary | ICD-10-CM

## 2015-11-05 DIAGNOSIS — I4891 Unspecified atrial fibrillation: Secondary | ICD-10-CM | POA: Diagnosis not present

## 2015-11-05 DIAGNOSIS — R5383 Other fatigue: Secondary | ICD-10-CM | POA: Diagnosis not present

## 2015-11-05 DIAGNOSIS — J189 Pneumonia, unspecified organism: Secondary | ICD-10-CM

## 2015-11-05 DIAGNOSIS — E43 Unspecified severe protein-calorie malnutrition: Secondary | ICD-10-CM

## 2015-11-05 DIAGNOSIS — R278 Other lack of coordination: Secondary | ICD-10-CM | POA: Diagnosis not present

## 2015-11-05 DIAGNOSIS — M25472 Effusion, left ankle: Secondary | ICD-10-CM | POA: Diagnosis not present

## 2015-11-05 MED ORDER — LEVOFLOXACIN 250 MG PO TABS: ORAL_TABLET | ORAL | Status: DC

## 2015-11-05 NOTE — Progress Notes (Addendum)
Subjective:    Patient ID: Daisy Scott, female    DOB: 08/07/1918, 82 y.o.   MRN: [id number]  CC: Extremity Weakness; Fatigue; Edema; and home health   HPI: Daisy Scott is a 82 y.o. female presenting for Extremity Weakness; Fatigue; Edema; and home health here with: [phone] Daughter is mobile phone # on pt's profile  H/o systolic CHF, EF most recently 25-30% in 05/2015, COPD on chronic oxygen  Past 2 weeks has been getting increasingly weaker.  Was seen in clinic 6 days ago, in the ED 3 days ago Was hyponatremic in ED to 128 which is new, also with elevated WBC to 15 CT scan of abd and chest unrevealing though did show "Ill-defined airspace disease in the lingula likely reflects atelectasis. Early infection is not excluded." 2 weeks ago could help children and sitters with standing, moving to bathroom, getting to bedside commode. Has 24h assistance at home, hospital bed Now not able to help at all Has some swelling b/l LE. Has been on 81m of lasix daily for the past 2 weeks apprx per daughter Always has some swelling, daughter says now not significantly worse than usual. L leg tends to swell more than R leg Top of R foot with purple/red area Doesn't remember hurting it Pt says it doesn't hurt now Creatinine has also been increasing since 08/2015, from 1.1 then to 1.9 three days ago. Increased oxygen requirement at home starting apprx 2 weeks ago No cough, no fever No dysuria Pt says she feels fine Two children concerned about her weakness Mental status is also fuzzier than usual Knows who she is, can't say her birthday without heavy prompting, doesn't remember the year of birth Doesn't know current year, family says that is baseline Says "it's close to fall, summer?" when asked about the season   [facility] 11/05/2015 10/30/2015 10/24/2015 04/24/2015 12/27/2014  Decreased Interest 0 3 0 0 0  Down, Depressed, Hopeless 1 2 0 0 0  PHQ - 2 Score 1 5  0 0 0  Altered sleeping - 0 - - -  Tired, decreased energy - 3 - - -  Change in appetite - 3 - - -  Feeling bad or failure about yourself  - 0 - - -  Trouble concentrating - 3 - - -  Moving slowly or fidgety/restless - 0 - - -  Suicidal thoughts - 0 - - -  PHQ-9 Score - 14 - - -     Relevant past medical, surgical, family and social history reviewed and updated as indicated.  Interim medical history since our last visit reviewed. Allergies and medications reviewed and updated.  ROS: Per HPI unless specifically indicated above  History  Smoking status  . Never Smoker   Smokeless tobacco  . Never Used       Objective:    BP 122/70 mmHg  Pulse 77  Temp(Src) 98 F (36.7 C) (Oral)  Ht   Wt   SpO2 94%  Wt Readings from Last 3 Encounters:  11/02/15 196 lb (88.905 kg)  10/30/15 196 lb 3.2 oz (88.996 kg)  10/24/15 198 lb (89.812 kg)     Gen: NAD, alert, oriented to self and place, cooperative with exam, NCAT EYES: EOMI, no scleral injection or icterus ENT:   OP without erythema LYMPH: no cervical LAD CV: NRRR, normal S1/S2 Resp: crackles L base, no wheezes, normal WOB, speaking in full sentences Abd: +BS, soft, NTND. no guarding or organomegaly Ext: 2+ pitting  edema b/l LE.  Neuro: Alert  Skin: R foot with apprx 10cm area that is tinged purple, no warmth to the touch, no induration. Cap refill <3 sec on toes. No tenderness with palpation of foot. Foot with 2+ edema, slightly more than L foot.      Assessment & Plan:    Daisy Scott was seen today for extremity weakness, fatigue, edema and home health. Pt well appearing though family says at home she has been complaining of weakness, has noticed change in strength over past week.  I am concerned re trend in Na, Cr and WBC from 7/13 to last labs 7/16. Will recheck today. Also sending BNP. CT scan 7/13 with possible atelectasis vs infection L lingula, did not have crackles in clinic 7/13, now with LLL crackles, R side  clear. Suspect volume overload with weight up 20 lbs and hyponatremia . Has been taking extra lasix (total of 47m once daily)for a week with minimal change in LE edema. Took three days of zaroxylyn 2.517m  Ddx for symptoms includes CHF exacerbation, infection, pneumonia possible given exam and CT scan though pt with no other symptoms. Has needed to increase O2 over past two weeks.  Will treat for CAP with levofloxacin, renally dosed.  If CrCl < 20 with labs today will do q48h instead of q24h. Don't give ativan unless needed SSRI may also be contributing to hyponatremia. Labs won't be back until tomorrow afternoon even if sent stat as drawn in afterhours clinic. If any worsening in symptoms, mental status, family to take to ED.  Family also not comfortable keeping her at home with increased level of care needed. Will have home health assess, family to contact ALFs in area.   Diagnoses and all orders for this visit:  Swelling of both ankles -     BMP8+EGFR -     Brain natriuretic peptide -     CBC with Differential/Platelet -     Home Health -     Face-to-face encounter (required for Medicare/Medicaid patients)  Weakness -     BMP8+EGFR -     Brain natriuretic peptide -     CBC with Differential/Platelet -     Home Health -     Face-to-face encounter (required for Medicare/Medicaid patients)  Other fatigue -     BMP8+EGFR -     Brain natriuretic peptide -     CBC with Differential/Platelet -     Home Health -     Face-to-face encounter (required for Medicare/Medicaid patients)  Mental confusion -     Home Health -     Face-to-face encounter (required for Medicare/Medicaid patients)  Unable to balance when standing -     Home Health -     Face-to-face encounter (required for Medicare/Medicaid patients)  SOB (shortness of breath) -     Home Health -     Face-to-face encounter (required for Medicare/Medicaid patients)  CAP (community acquired pneumonia) Treat as  above Will try to get home xray tomorrow  Atrial fibrillation with RVR (HCRound TopRate controlled  Essential hypertension Well ocntrolled, continue current medicines  Protein-calorie malnutrition, severe (HCMarionContinues to have poor if not worsening appetite  CKD (chronic kidney disease), stage III Creatinine check today    Follow up plan: Pending labs  CaAssunta FoundMD [facility] 11/05/2015, 7:28 PM  ADDENDUM: Worsening creatinine, hyponatremia Pt similar today per daughter BaPamala HurryE edema worse Has had good urine output last night Recommended they take her to  hospital for volume overload refractory to oral diuretics

## 2015-11-06 ENCOUNTER — Telehealth: Payer: Self-pay | Admitting: Pediatrics

## 2015-11-06 ENCOUNTER — Emergency Department (HOSPITAL_COMMUNITY): Payer: Medicare Other

## 2015-11-06 ENCOUNTER — Telehealth: Payer: Self-pay | Admitting: Internal Medicine

## 2015-11-06 ENCOUNTER — Encounter (HOSPITAL_COMMUNITY): Payer: Self-pay | Admitting: Emergency Medicine

## 2015-11-06 ENCOUNTER — Inpatient Hospital Stay (HOSPITAL_COMMUNITY)
Admission: EM | Admit: 2015-11-06 | Discharge: 2015-11-11 | DRG: 641 | Disposition: A | Discharge status: Home Health | Payer: Medicare Other | Attending: Internal Medicine | Admitting: Internal Medicine

## 2015-11-06 ENCOUNTER — Ambulatory Visit: Payer: Medicare Other | Admitting: Family Medicine

## 2015-11-06 DIAGNOSIS — M1711 Unilateral primary osteoarthritis, right knee: Secondary | ICD-10-CM | POA: Diagnosis present

## 2015-11-06 DIAGNOSIS — N289 Disorder of kidney and ureter, unspecified: Secondary | ICD-10-CM

## 2015-11-06 DIAGNOSIS — Z7951 Long term (current) use of inhaled steroids: Secondary | ICD-10-CM

## 2015-11-06 DIAGNOSIS — N179 Acute kidney failure, unspecified: Secondary | ICD-10-CM | POA: Diagnosis present

## 2015-11-06 DIAGNOSIS — Z6835 Body mass index (BMI) 35.0-35.9, adult: Secondary | ICD-10-CM | POA: Diagnosis not present

## 2015-11-06 DIAGNOSIS — Z66 Do not resuscitate: Secondary | ICD-10-CM | POA: Diagnosis present

## 2015-11-06 DIAGNOSIS — I5042 Chronic combined systolic (congestive) and diastolic (congestive) heart failure: Secondary | ICD-10-CM | POA: Diagnosis present

## 2015-11-06 DIAGNOSIS — N183 Chronic kidney disease, stage 3 (moderate): Secondary | ICD-10-CM | POA: Diagnosis present

## 2015-11-06 DIAGNOSIS — I429 Cardiomyopathy, unspecified: Secondary | ICD-10-CM | POA: Diagnosis present

## 2015-11-06 DIAGNOSIS — Z8249 Family history of ischemic heart disease and other diseases of the circulatory system: Secondary | ICD-10-CM | POA: Diagnosis not present

## 2015-11-06 DIAGNOSIS — R600 Localized edema: Secondary | ICD-10-CM | POA: Diagnosis not present

## 2015-11-06 DIAGNOSIS — Z7189 Other specified counseling: Secondary | ICD-10-CM

## 2015-11-06 DIAGNOSIS — R627 Adult failure to thrive: Secondary | ICD-10-CM | POA: Diagnosis present

## 2015-11-06 DIAGNOSIS — R0602 Shortness of breath: Secondary | ICD-10-CM

## 2015-11-06 DIAGNOSIS — J45909 Unspecified asthma, uncomplicated: Secondary | ICD-10-CM | POA: Diagnosis present

## 2015-11-06 DIAGNOSIS — I509 Heart failure, unspecified: Secondary | ICD-10-CM | POA: Insufficient documentation

## 2015-11-06 DIAGNOSIS — Z515 Encounter for palliative care: Secondary | ICD-10-CM

## 2015-11-06 DIAGNOSIS — E871 Hypo-osmolality and hyponatremia: Secondary | ICD-10-CM | POA: Diagnosis not present

## 2015-11-06 DIAGNOSIS — I480 Paroxysmal atrial fibrillation: Secondary | ICD-10-CM | POA: Diagnosis present

## 2015-11-06 DIAGNOSIS — E039 Hypothyroidism, unspecified: Secondary | ICD-10-CM | POA: Diagnosis present

## 2015-11-06 DIAGNOSIS — Z7982 Long term (current) use of aspirin: Secondary | ICD-10-CM | POA: Diagnosis not present

## 2015-11-06 DIAGNOSIS — I5189 Other ill-defined heart diseases: Secondary | ICD-10-CM | POA: Diagnosis present

## 2015-11-06 DIAGNOSIS — E876 Hypokalemia: Secondary | ICD-10-CM | POA: Diagnosis present

## 2015-11-06 DIAGNOSIS — J9611 Chronic respiratory failure with hypoxia: Secondary | ICD-10-CM | POA: Diagnosis present

## 2015-11-06 DIAGNOSIS — I519 Heart disease, unspecified: Secondary | ICD-10-CM | POA: Diagnosis present

## 2015-11-06 DIAGNOSIS — I130 Hypertensive heart and chronic kidney disease with heart failure and stage 1 through stage 4 chronic kidney disease, or unspecified chronic kidney disease: Secondary | ICD-10-CM | POA: Diagnosis present

## 2015-11-06 DIAGNOSIS — E861 Hypovolemia: Secondary | ICD-10-CM | POA: Diagnosis present

## 2015-11-06 DIAGNOSIS — Z9981 Dependence on supplemental oxygen: Secondary | ICD-10-CM

## 2015-11-06 DIAGNOSIS — I10 Essential (primary) hypertension: Secondary | ICD-10-CM | POA: Diagnosis present

## 2015-11-06 DIAGNOSIS — R531 Weakness: Secondary | ICD-10-CM | POA: Diagnosis not present

## 2015-11-06 HISTORY — DX: Chronic respiratory failure with hypoxia: J96.11

## 2015-11-06 HISTORY — DX: Diaphragmatic hernia without obstruction or gangrene: K44.9

## 2015-11-06 HISTORY — DX: Dependence on supplemental oxygen: Z99.81

## 2015-11-06 LAB — CBC WITH DIFFERENTIAL/PLATELET
BASOS: 0 %
Basophils Absolute: 0.1 10*3/uL (ref 0.0–0.2)
Basophils Absolute: 0 K/uL (ref 0.0–0.1)
Basophils Relative: 0 %
EOS (ABSOLUTE): 0.2 10*3/uL (ref 0.0–0.4)
EOS: 2 %
Eosinophils Absolute: 0.3 K/uL (ref 0.0–0.7)
Eosinophils Relative: 2 %
HCT: 37.8 % (ref 36.0–46.0)
HEMATOCRIT: 38.6 % (ref 34.0–46.6)
HEMOGLOBIN: 13.3 g/dL (ref 11.1–15.9)
Hemoglobin: 12.6 g/dL (ref 12.0–15.0)
IMMATURE GRANULOCYTES: 1 %
Immature Grans (Abs): 0.1 10*3/uL (ref 0.0–0.1)
LYMPHS ABS: 1.9 10*3/uL (ref 0.7–3.1)
Lymphocytes Relative: 12 %
Lymphs Abs: 1.6 K/uL (ref 0.7–4.0)
Lymphs: 16 %
MCH: 31.5 pg (ref 26.6–33.0)
MCH: 31.3 pg (ref 26.0–34.0)
MCHC: 34.5 g/dL (ref 31.5–35.7)
MCHC: 33.3 g/dL (ref 30.0–36.0)
MCV: 92 fL (ref 79–97)
MCV: 93.8 fL (ref 78.0–100.0)
MONOCYTES: 9 %
MONOS ABS: 1 10*3/uL — AB (ref 0.1–0.9)
Monocytes Absolute: 1.4 K/uL — ABNORMAL HIGH (ref 0.1–1.0)
Monocytes Relative: 11 %
NEUTROS PCT: 72 %
Neutro Abs: 9.8 K/uL — ABNORMAL HIGH (ref 1.7–7.7)
Neutrophils Absolute: 8.3 10*3/uL — ABNORMAL HIGH (ref 1.4–7.0)
Neutrophils Relative %: 75 %
Platelets: 321 10*3/uL (ref 150–379)
Platelets: 250 K/uL (ref 150–400)
RBC: 4.22 x10E6/uL (ref 3.77–5.28)
RBC: 4.03 MIL/uL (ref 3.87–5.11)
RDW: 15 % (ref 12.3–15.4)
RDW: 15 % (ref 11.5–15.5)
WBC: 11.6 10*3/uL — ABNORMAL HIGH (ref 3.4–10.8)
WBC: 13.1 K/uL — ABNORMAL HIGH (ref 4.0–10.5)

## 2015-11-06 LAB — BMP8+EGFR
BUN/Creatinine Ratio: 14 (ref 12–28)
BUN: 34 mg/dL (ref 10–36)
CO2: 24 mmol/L (ref 18–29)
CREATININE: 2.38 mg/dL — AB (ref 0.57–1.00)
Calcium: 9.1 mg/dL (ref 8.7–10.3)
Chloride: 85 mmol/L — ABNORMAL LOW (ref 96–106)
GFR, EST AFRICAN AMERICAN: 19 mL/min/{1.73_m2} — AB (ref >59)
GFR, EST NON AFRICAN AMERICAN: 17 mL/min/{1.73_m2} — AB (ref >59)
Glucose: 113 mg/dL — ABNORMAL HIGH (ref 65–99)
POTASSIUM: 4.7 mmol/L (ref 3.5–5.2)
SODIUM: 127 mmol/L — AB (ref 134–144)

## 2015-11-06 LAB — URINALYSIS, ROUTINE W REFLEX MICROSCOPIC
Bilirubin Urine: NEGATIVE
Glucose, UA: NEGATIVE mg/dL
Hgb urine dipstick: NEGATIVE
Ketones, ur: NEGATIVE mg/dL
Leukocytes, UA: NEGATIVE
Nitrite: NEGATIVE
Protein, ur: NEGATIVE mg/dL
Specific Gravity, Urine: 1.015 (ref 1.005–1.030)
pH: 5 (ref 5.0–8.0)

## 2015-11-06 LAB — COMPREHENSIVE METABOLIC PANEL
ALBUMIN: 3.5 g/dL (ref 3.5–5.0)
ALK PHOS: 72 U/L (ref 38–126)
ALT: 35 U/L (ref 14–54)
AST: 33 U/L (ref 15–41)
Anion gap: 11 (ref 5–15)
BILIRUBIN TOTAL: 0.6 mg/dL (ref 0.3–1.2)
BUN: 33 mg/dL — AB (ref 6–20)
CALCIUM: 8.7 mg/dL — AB (ref 8.9–10.3)
CO2: 28 mmol/L (ref 22–32)
CREATININE: 2.03 mg/dL — AB (ref 0.44–1.00)
Chloride: 88 mmol/L — ABNORMAL LOW (ref 101–111)
GFR calc Af Amer: 23 mL/min — ABNORMAL LOW (ref >60)
GFR, EST NON AFRICAN AMERICAN: 20 mL/min — AB (ref >60)
GLUCOSE: 115 mg/dL — AB (ref 65–99)
POTASSIUM: 2.9 mmol/L — AB (ref 3.5–5.1)
Sodium: 127 mmol/L — ABNORMAL LOW (ref 135–145)
TOTAL PROTEIN: 6.8 g/dL (ref 6.5–8.1)

## 2015-11-06 LAB — BRAIN NATRIURETIC PEPTIDE
B Natriuretic Peptide: 1178 pg/mL — ABNORMAL HIGH (ref 0.0–100.0)
BNP: 1679.9 pg/mL — ABNORMAL HIGH (ref 0.0–100.0)

## 2015-11-06 LAB — TROPONIN I: TROPONIN I: 0.06 ng/mL — AB (ref <0.03)

## 2015-11-06 LAB — LACTIC ACID, PLASMA: Lactic Acid, Venous: 1.1 mmol/L (ref 0.5–1.9)

## 2015-11-06 MED ORDER — ONDANSETRON HCL 4 MG PO TABS: 4.0000 mg | ORAL_TABLET | Freq: Four times a day (QID) | ORAL | Status: DC | PRN

## 2015-11-06 MED ORDER — ENSURE ENLIVE PO LIQD
237.0000 mL | Freq: Three times a day (TID) | ORAL | Status: DC
  Administered 2015-11-07 – 2015-11-11 (×11): 237 mL via ORAL

## 2015-11-06 MED ORDER — DILTIAZEM HCL ER COATED BEADS 240 MG PO CP24
240.0000 mg | ORAL_CAPSULE | Freq: Every day | ORAL | Status: DC
  Administered 2015-11-07 – 2015-11-11 (×5): 240 mg via ORAL
  Filled 2015-11-06 (×5): qty 1

## 2015-11-06 MED ORDER — BRINZOLAMIDE 1 % OP SUSP
1.0000 [drp] | Freq: Two times a day (BID) | OPHTHALMIC | Status: DC
  Administered 2015-11-07 – 2015-11-11 (×9): 1 [drp] via OPHTHALMIC
  Filled 2015-11-06: qty 10

## 2015-11-06 MED ORDER — BOOST PO LIQD
237.0000 mL | Freq: Three times a day (TID) | ORAL | Status: DC
  Filled 2015-11-06 (×2): qty 237

## 2015-11-06 MED ORDER — TRIMETHOPRIM 100 MG PO TABS
100.0000 mg | ORAL_TABLET | Freq: Every day | ORAL | Status: DC
  Administered 2015-11-07 – 2015-11-11 (×5): 100 mg via ORAL
  Filled 2015-11-06 (×6): qty 1

## 2015-11-06 MED ORDER — SODIUM CHLORIDE 0.9% FLUSH
3.0000 mL | Freq: Two times a day (BID) | INTRAVENOUS | Status: DC
  Administered 2015-11-06 – 2015-11-10 (×4): 3 mL via INTRAVENOUS

## 2015-11-06 MED ORDER — BRIMONIDINE TARTRATE 0.2 % OP SOLN
1.0000 [drp] | Freq: Every day | OPHTHALMIC | Status: DC
  Administered 2015-11-07 – 2015-11-11 (×5): 1 [drp] via OPHTHALMIC
  Filled 2015-11-06: qty 5

## 2015-11-06 MED ORDER — ACETAMINOPHEN 650 MG RE SUPP: 650.0000 mg | Freq: Four times a day (QID) | RECTAL | Status: DC | PRN

## 2015-11-06 MED ORDER — ALBUTEROL SULFATE (2.5 MG/3ML) 0.083% IN NEBU
2.5000 mg | INHALATION_SOLUTION | RESPIRATORY_TRACT | Status: DC | PRN
  Administered 2015-11-10: 2.5 mg via RESPIRATORY_TRACT
  Filled 2015-11-06: qty 3

## 2015-11-06 MED ORDER — ASPIRIN EC 81 MG PO TBEC
81.0000 mg | DELAYED_RELEASE_TABLET | Freq: Every day | ORAL | Status: DC
  Administered 2015-11-07 – 2015-11-11 (×5): 81 mg via ORAL
  Filled 2015-11-06 (×5): qty 1

## 2015-11-06 MED ORDER — LORATADINE 10 MG PO TABS
10.0000 mg | ORAL_TABLET | Freq: Every day | ORAL | Status: DC
  Administered 2015-11-07 – 2015-11-11 (×5): 10 mg via ORAL
  Filled 2015-11-06 (×5): qty 1

## 2015-11-06 MED ORDER — ACETAMINOPHEN 325 MG PO TABS
650.0000 mg | ORAL_TABLET | Freq: Four times a day (QID) | ORAL | Status: DC | PRN
  Administered 2015-11-07: 650 mg via ORAL
  Filled 2015-11-06: qty 2

## 2015-11-06 MED ORDER — MOMETASONE FURO-FORMOTEROL FUM 200-5 MCG/ACT IN AERO
2.0000 | INHALATION_SPRAY | Freq: Two times a day (BID) | RESPIRATORY_TRACT | Status: DC
  Administered 2015-11-07 – 2015-11-11 (×9): 2 via RESPIRATORY_TRACT
  Filled 2015-11-06: qty 8.8

## 2015-11-06 MED ORDER — ONDANSETRON HCL 4 MG/2ML IJ SOLN: 4.0000 mg | Freq: Four times a day (QID) | INTRAMUSCULAR | Status: DC | PRN

## 2015-11-06 MED ORDER — IPRATROPIUM-ALBUTEROL 0.5-2.5 (3) MG/3ML IN SOLN
3.0000 mL | Freq: Four times a day (QID) | RESPIRATORY_TRACT | Status: DC
  Administered 2015-11-07: 3 mL via RESPIRATORY_TRACT
  Filled 2015-11-06: qty 3

## 2015-11-06 MED ORDER — LORAZEPAM 0.5 MG PO TABS
0.5000 mg | ORAL_TABLET | Freq: Four times a day (QID) | ORAL | Status: DC | PRN
  Administered 2015-11-06 – 2015-11-10 (×5): 0.5 mg via ORAL
  Filled 2015-11-06 (×5): qty 1

## 2015-11-06 MED ORDER — ENOXAPARIN SODIUM 30 MG/0.3ML ~~LOC~~ SOLN
30.0000 mg | SUBCUTANEOUS | Status: DC
  Administered 2015-11-06 – 2015-11-10 (×5): 30 mg via SUBCUTANEOUS
  Filled 2015-11-06 (×5): qty 0.3

## 2015-11-06 MED ORDER — SENNOSIDES-DOCUSATE SODIUM 8.6-50 MG PO TABS: 1.0000 | ORAL_TABLET | Freq: Every evening | ORAL | Status: DC | PRN

## 2015-11-06 MED ORDER — SODIUM CHLORIDE 0.9 % IV SOLN
INTRAVENOUS | Status: DC
  Administered 2015-11-06 – 2015-11-08 (×2): via INTRAVENOUS

## 2015-11-06 MED ORDER — POTASSIUM CHLORIDE CRYS ER 20 MEQ PO TBCR
40.0000 meq | EXTENDED_RELEASE_TABLET | Freq: Once | ORAL | Status: AC
Start: 2015-11-06 — End: 2015-11-06
  Administered 2015-11-06: 40 meq via ORAL
  Filled 2015-11-06: qty 2

## 2015-11-06 MED ORDER — FUROSEMIDE 10 MG/ML IJ SOLN
40.0000 mg | Freq: Once | INTRAMUSCULAR | Status: AC
  Administered 2015-11-06: 40 mg via INTRAVENOUS
  Filled 2015-11-06: qty 4

## 2015-11-06 MED ORDER — FAMOTIDINE 20 MG PO TABS
20.0000 mg | ORAL_TABLET | Freq: Two times a day (BID) | ORAL | Status: DC
  Administered 2015-11-06 – 2015-11-11 (×10): 20 mg via ORAL
  Filled 2015-11-06 (×10): qty 1

## 2015-11-06 MED ORDER — LEVOTHYROXINE SODIUM 75 MCG PO TABS
75.0000 ug | ORAL_TABLET | Freq: Every day | ORAL | Status: DC
  Administered 2015-11-07 – 2015-11-11 (×5): 75 ug via ORAL
  Filled 2015-11-06 (×5): qty 1

## 2015-11-06 NOTE — ED Notes (Signed)
Attempted x 2 for IV access without success.  

## 2015-11-06 NOTE — H&P (Signed)
History and Physical    Daisy Scott ZOX:[id number] DOB: 1919-02-19 DOA: 11/06/2015  PCP: Rudi Heap, MD Consultants:  Lottie Dawson - ophthalmology Patient coming from: home; has 24 hour caregivers  Chief Complaint: weakness  HPI: Daisy Scott is a 82 y.o. female with medical history significant of HTN, asthma, CKD stage 3, CHF (EF 25-30% in 2/16 with indeterminate diastolic function) and afib.  History provided by her son and DIL as the patient preferred to rest quietly.  They report that she started feeling bad 2 weeks ago, progressively weaker during that time.  Now cannot even assist to get on commode and into bed.  Increased cough, nonproductive.  Came to ER on Sunday - abdominal pain and discomfort into throat - found a large hiatal hernia and started on Pepcid with some improvement of these symptoms.  This week, even more loss of appetite, difficulty moving limbs, slurred speech.  Still drinking water but not eating, maybe drinking a small amount of cranberry juice.  +constipation - despite loose bowels for 5 years prior.  Went to see Dr. Oswaldo Done last night - large spot on her foot they were concerned about.  Lots of blood work, called back today and asked her to come in to ER today.  Also RLE edema, chronic but worse than usual (h/o DVTs in the past).  The place on her foot is on the dorsum of the right foot and they are unaware of any trauma.  Diagnosed with PNA Feb 2016.  Started on 3L [place] O2 at discharge, has increased home O2 to 4L over the last 2 weeks. Diagnosed with mild UTI about 3 weeks ago and treated with antibiotics.   Also taking daily Trimethoprim, presumably for prophylaxis.   ED Course: Potassium repleted PO.  BNP elevated so given IV Lasix.  Review of Systems: As per HPI; otherwise 10 point review of systems reviewed and negative.   Ambulatory Status: requires significant assistance with ADLs, 24 hour caregivers  Past Medical History  Diagnosis Date  . Essential  hypertension   . Hypothyroidism   . Asthma   . Gout   . CKD (chronic kidney disease) stage 3, GFR 30-59 ml/min   . Ascending aorta dilation (HCC)     4.2 cm 2011   . Atrial fibrillation [facility])     Documented February 2016 - question paroxysmal   . Severe malnutrition (HCC)   . Secondary cardiomyopathy (HCC)   . COPD (chronic obstructive pulmonary disease) (HCC)   . CHF (congestive heart failure) (HCC)   . On home O2     3-4L  . Hiatal hernia     Past Surgical History  Procedure Laterality Date  . Rotator cuff repair    . Abdominal hysterectomy      Social History   Social History  . Marital Status: Widowed    Spouse Name: N/A  . Number of Children: N/A  . Years of Education: N/A   Occupational History  . Not on file.   Social History Main Topics  . Smoking status: Never Smoker   . Smokeless tobacco: Never Used  . Alcohol Use: No  . Drug Use: No  . Sexual Activity: Not on file   Other Topics Concern  . Not on file   Social History Narrative    Allergies  Allergen Reactions  . Lisinopril Other (See Comments)    Side of face dropped.  Looked like patient had had a stroke.  . Sulfa Antibiotics     Possible  reaction- vomited And angio edema  . Ciprofloxacin Nausea Only  . Macrobid [Nitrofurantoin Macrocrystal] Nausea Only    Family History  Problem Relation Age of Onset  . Heart attack Father     Prior to Admission medications   Medication Sig Start Date End Date Taking? Authorizing Provider  allopurinol (ZYLOPRIM) 100 MG tablet TAKE TWO TABLETS BY MOUTH DAILY 10/15/15  Yes Ernestina Penna, MD  aspirin EC 81 MG tablet Take 81 mg by mouth daily.   Yes Historical Provider, MD  AZOPT 1 % ophthalmic suspension INSTILL ONE DROP IN RIGHT EYE TWICE DAILY AS DIRECTED 10/17/15  Yes Ernestina Penna, MD  brimonidine (ALPHAGAN) 0.2 % ophthalmic solution Place 1 drop into both eyes daily.   Yes Historical Provider, MD  Cranberry 500 MG CAPS Take 1 capsule by mouth  daily.   Yes Historical Provider, MD  diltiazem (CARDIZEM CD) 240 MG 24 hr capsule TAKE ONE (1) CAPSULE EACH DAY 11/04/15  Yes Ernestina Penna, MD  famotidine (PEPCID) 20 MG tablet Take 1 tablet (20 mg total) by mouth 2 (two) times daily. 11/02/15  Yes Jacalyn Lefevre, MD  feeding supplement, ENSURE COMPLETE, (ENSURE COMPLETE) LIQD Take 237 mLs by mouth 3 (three) times daily between meals. June 12, 2014  Yes Standley Brooking, MD  furosemide (LASIX) 20 MG tablet TAKE ONE (1) TABLET EACH DAY 05/07/15  Yes Ernestina Penna, MD  ipratropium (ATROVENT) 0.02 % nebulizer solution NEBULIZE 1 VIAL EVERY 6 HOURS 07/31/15  Yes Ernestina Penna, MD  levalbuterol Pauline Aus) 0.63 MG/3ML nebulizer solution NEBULIZE 1 VIAL EVERY 6 HOURS 10/24/15  Yes Ernestina Penna, MD  levothyroxine (SYNTHROID, LEVOTHROID) 75 MCG tablet TAKE ONE (1) TABLET EACH DAY 11/04/15  Yes Ernestina Penna, MD  loratadine (CLARITIN) 10 MG tablet Take 10 mg by mouth daily.   Yes Historical Provider, MD  LORazepam (ATIVAN) 0.5 MG tablet TAKE ONE TABLET TWICE DAILY AS NEEDED 11/04/15  Yes Ernestina Penna, MD  Multiple Vitamin (MULTIVITAMIN WITH MINERALS) TABS tablet Take 1 tablet by mouth daily.   Yes Historical Provider, MD  pantoprazole (PROTONIX) 40 MG tablet TAKE ONE (1) TABLET EACH DAY 07/04/15  Yes Frederica Kuster, MD  potassium chloride SA (K-DUR,KLOR-CON) 20 MEQ tablet TAKE ONE (1) TABLET EACH DAY 07/04/15  Yes Frederica Kuster, MD  sertraline (ZOLOFT) 50 MG tablet TAKE 1/2 TABLET DAILY 10/15/15  Yes Ernestina Penna, MD  SYMBICORT 160-4.5 MCG/ACT inhaler USE 2 PUFFS TWICE DAILY 10/31/15  Yes Ernestina Penna, MD  TRAVATAN Z 0.004 % SOLN ophthalmic solution PLACE 1 DROP IN RIGHT EYE AT BEDTIME 10/17/15  Yes Ernestina Penna, MD  trimethoprim (TRIMPEX) 100 MG tablet Take 100 mg by mouth daily.  12/12/14  Yes Historical Provider, MD  levofloxacin (LEVAQUIN) 250 MG tablet Take 500mg  first day, 250mg  q24h for total treatment of days. Patient not taking: Reported on  11/06/2015 11/05/15   Johna Sheriff, MD    Physical Exam: Filed Vitals:   11/06/15 1915 11/06/15 2000 11/06/15 2004 11/06/15 2030  BP: 156/74 136/113  140/104  Pulse:  56 66   Temp:      TempSrc:      Resp: 30 17 19 14   Height:      Weight:      SpO2:  92% 97%      General: Appears calm and comfortable and is NAD; wheezing is audible at the bedside Eyes: PERRL, EOMI, normal lids, iris ENT: hard of hearing, lips &  tongue, mmm Neck:  no LAD, masses or thyromegaly Cardiovascular:  Irregularly irregular, no m/r/g.  Respiratory:  CTA bilaterally, no w/r/r. Normal respiratory effort. Abdomen:  soft, ntnd, NABS Skin: RLE with 2+ pitting edema along the lower leg which is chronic according to family but also with 3+ pedal edema which is new.  There is ecchymosis along the dorsum of the foot without tenderness to palpation on either the dorsal or plantar aspect Musculoskeletal:  grossly normal tone BUE/BLE, good ROM, no bony abnormality Psychiatric:  grossly normal mood and affect, speech fluent and appropriate, AOx3 Neurologic:  CN 2-12 grossly intact, moves all extremities in coordinated fashion, sensation intact  Labs on Admission: I have personally reviewed following labs and imaging studies  CBC:  Recent Labs Lab 11/02/15 1226 11/05/15 1814 11/06/15 1731  WBC 15.4* 11.6* 13.1*  NEUTROABS 12.9* 8.3* 9.8*  HGB 13.5  --  12.6  HCT 40.6 38.6 37.8  MCV 94.0 92 93.8  PLT 336 321 250   Basic Metabolic Panel:  Recent Labs Lab 11/02/15 1226 11/05/15 1814 11/06/15 1731  NA 128* 127* 127*  K 4.9 4.7 2.9*  CL 94* 85* 88*  CO2 25 24 28   GLUCOSE 124* 113* 115*  BUN 27* 34 33*  CREATININE 1.91* 2.38* 2.03*  CALCIUM 9.2 9.1 8.7*   GFR: Estimated Creatinine Clearance: 17.5 mL/min (by C-G formula based on Cr of 2.03). Liver Function Tests:  Recent Labs Lab 11/02/15 1226 11/06/15 1731  AST 33 33  ALT 24 35  ALKPHOS 78 72  BILITOT 0.4 0.6  PROT 7.1 6.8  ALBUMIN 3.6  3.5    Recent Labs Lab 11/02/15 1226  LIPASE 18   No results for input(s): AMMONIA in the last 168 hours. Coagulation Profile: No results for input(s): INR, PROTIME in the last 168 hours. Cardiac Enzymes:  Recent Labs Lab 11/02/15 1226 11/06/15 1731  TROPONINI 0.06* 0.06*   BNP (last 3 results) No results for input(s): PROBNP in the last 8760 hours. HbA1C: No results for input(s): HGBA1C in the last 72 hours. CBG: No results for input(s): GLUCAP in the last 168 hours. Lipid Profile: No results for input(s): CHOL, HDL, LDLCALC, TRIG, CHOLHDL, LDLDIRECT in the last 72 hours. Thyroid Function Tests: No results for input(s): TSH, T4TOTAL, FREET4, T3FREE, THYROIDAB in the last 72 hours. Anemia Panel: No results for input(s): VITAMINB12, FOLATE, FERRITIN, TIBC, IRON, RETICCTPCT in the last 72 hours. Urine analysis:    Component Value Date/Time   COLORURINE STRAW* 11/06/2015 1900   APPEARANCEUR CLEAR 11/06/2015 1900   APPEARANCEUR Clear 10/30/2015 1547   LABSPEC 1.015 11/06/2015 1900   PHURINE 5.0 11/06/2015 1900   GLUCOSEU NEGATIVE 11/06/2015 1900   HGBUR NEGATIVE 11/06/2015 1900   BILIRUBINUR NEGATIVE 11/06/2015 1900   BILIRUBINUR Negative 10/30/2015 1547   BILIRUBINUR neg 02/04/2015 1034   KETONESUR NEGATIVE 11/06/2015 1900   PROTEINUR NEGATIVE 11/06/2015 1900   PROTEINUR Trace* 10/30/2015 1547   PROTEINUR 30+ 02/04/2015 1034   UROBILINOGEN negative 02/04/2015 1034   UROBILINOGEN 0.2 05/29/2014 1748   NITRITE NEGATIVE 11/06/2015 1900   NITRITE Negative 10/30/2015 1547   NITRITE pos 02/04/2015 1034   LEUKOCYTESUR NEGATIVE 11/06/2015 1900   LEUKOCYTESUR 1+* 10/30/2015 1547    Creatinine Clearance: Estimated Creatinine Clearance: 17.5 mL/min (by C-G formula based on Cr of 2.03).  Sepsis Labs: @LABRCNTIP (procalcitonin:4,lacticidven:4) ) Recent Results (from the past 240 hour(s))  Microscopic Examination     Status: Abnormal   Collection Time: 10/30/15  3:47 PM   Result  Value Ref Range Status   WBC, UA 6-10 (A) 0 -  5 /hpf Final   RBC, UA 0-2 0 -  2 /hpf Final   Epithelial Cells (non renal) >10 (A) 0 - 10 /hpf Final   Renal Epithel, UA >10 (A) None seen /hpf Final   Bacteria, UA Few None seen/Few Final     Radiological Exams on Admission: Dg Chest 2 View  11/06/2015  CLINICAL DATA:  Generalized weakness. Right leg swelling. Congestive heart failure. Atrial fibrillation. COPD. EXAM: CHEST  2 VIEW COMPARISON:  10/24/2015 FINDINGS: Cardiomegaly remains stable. Ectasia and atherosclerotic calcification of thoracic aorta appears unchanged. Bibasilar pulmonary parenchymal scarring again noted kappa however there is no evidence of pulmonary edema or consolidation. No evidence of pneumothorax or pleural effusion. Pulmonary hyperinflation again noted. IMPRESSION: Stable cardiomegaly and bilateral pulmonary scarring. No active lung disease. Aortic atherosclerosis noted. Electronically Signed   By: Myles Rosenthal M.D.   On: 11/06/2015 18:25   Dg Ankle Complete Right  11/06/2015  CLINICAL DATA:  Right ankle swelling and contusion. EXAM: RIGHT ANKLE - COMPLETE 3+ VIEW COMPARISON:  None. FINDINGS: There is no evidence of fracture, dislocation, or joint effusion. Generalized osteopenia noted, but no focal bone abnormality identified. Diffuse soft tissue swelling noted. Extensive peripheral vascular calcification also seen. IMPRESSION: Diffuse soft tissue swelling.  No evidence of fracture . Electronically Signed   By: Myles Rosenthal M.D.   On: 11/06/2015 18:28   Ct Head Wo Contrast  11/06/2015  CLINICAL DATA:  Slurred speech and weakness for 4 days. EXAM: CT HEAD WITHOUT CONTRAST TECHNIQUE: Contiguous axial images were obtained from the base of the skull through the vertex without intravenous contrast. COMPARISON:  04/02/2014 FINDINGS: There is no evidence of intracranial hemorrhage, brain edema, or other signs of acute infarction. There is no evidence of intracranial mass  lesion or mass effect. No abnormal extraaxial fluid collections are identified. Mild diffuse cerebral and cerebellar atrophy and chronic small vessel disease are stable in appearance. No evidence of hydrocephalus. No skull abnormality identified. IMPRESSION: No acute intracranial abnormality. Stable cerebral and cerebellar atrophy, and chronic small vessel disease. Electronically Signed   By: Myles Rosenthal M.D.   On: 11/06/2015 19:25   US Venous Img Lower Unilateral Right  11/06/2015  CLINICAL DATA:  Swelling EXAM: RIGHT LOWER EXTREMITY VENOUS DOPPLER ULTRASOUND TECHNIQUE: Gray-scale sonography with graded compression, as well as color Doppler and duplex ultrasound were performed to evaluate the lower extremity deep venous systems from the level of the common femoral vein and including the common femoral, femoral, profunda femoral, popliteal and calf veins including the posterior tibial, peroneal and gastrocnemius veins when visible. The superficial great saphenous vein was also interrogated. Spectral Doppler was utilized to evaluate flow at rest and with distal augmentation maneuvers in the common femoral, femoral and popliteal veins. COMPARISON:  None. FINDINGS: Contralateral Common Femoral Vein: Respiratory phasicity is normal and symmetric with the symptomatic side. No evidence of thrombus. Normal compressibility. Common Femoral Vein: No evidence of thrombus. Normal compressibility, respiratory phasicity and response to augmentation. Saphenofemoral Junction: No evidence of thrombus. Normal compressibility and flow on color Doppler imaging. Profunda Femoral Vein: No evidence of thrombus. Normal compressibility and flow on color Doppler imaging. Femoral Vein: No evidence of thrombus. Normal compressibility, respiratory phasicity and response to augmentation. Popliteal Vein: No evidence of thrombus. Normal compressibility, respiratory phasicity and response to augmentation. Calf Veins: Poor visualization of the  calf veins and peroneal vein. Superficial Great Saphenous Vein: Poor visualization  of the greater saphenous vein. Venous Reflux:  None. Other Findings:  None. IMPRESSION: Evaluation of the calf veins and greater saphenous veins is limited. No DVT seen from the right common femoral vein through the popliteal vein. Electronically Signed   By: Gerome Sam III M.D   On: 11/06/2015 18:48   Dg Foot Complete Right  11/06/2015  CLINICAL DATA:  Right foot swelling and contusion. EXAM: RIGHT FOOT COMPLETE - 3+ VIEW COMPARISON:  None. FINDINGS: There is no evidence of acute fracture or dislocation. Severe hallux valgus with bony bunion formation noted. Generalized osteopenia. No focal lytic or sclerotic bone lesions. Small calcaneal bone spurs noted. Peripheral vascular calcification also seen. IMPRESSION: No acute findings. Severe hallux valgus and bony bunion. Electronically Signed   By: Myles Rosenthal M.D.   On: 11/06/2015 18:30    EKG: Independently reviewed.  Afib, rate controlled, no evidence of acute ischemia  Assessment/Plan Principal Problem:   Hyponatremia Active Problems:   Hypertension   Hypothyroid   Asthma, chronic   Severe left ventricular systolic dysfunction   Acute renal failure superimposed on stage 3 chronic kidney disease (HCC)   Edema of right lower extremity   Hypokalemia   Hyponatremia -Etiology appears to be hypovolemic hyponatremia -Despite h/o severely depressed EF, patient without clinical evidence of volume overload and a negative CXR at this time -She does, however, have AKI on CKD which further indicates hypovolemia -Historically, she has been eating very little and drinking only water - indicating water intoxication is likely playing a role -Will attempt to gently rehydrate the patient with NS at 50 cc per hour with the understanding that her fluid balance is quite tenuous and she may develop volume overload -Will monitor sodium q12h to attempt to avoid overcorrection  (>4mEq/L/day) so as to avoid central pontine myelinolysis, although given her relatively mild hyponatremia this is less of a concern -As this problem clearly worsened over the last week, it may take several days to fully correct her sodium  Hypothyroidism -Appears to have last been checked in 3/16 -Will recheck TSH and free T4 now -Continue current dose of Synthroid in the meantime  Asthma vs. COPD -Lifelong non-smoker so COPD seems less likely but she does have chronic pulmonary hyperinflation on CXR -Regardless of cause, she does have wheezing as well as a home O2 requirement -Will continue home O2 at 3L -Will d/c Lovenox and add Duonebs q6h and Albuterol q4h prn wheezing -No current historical, physical exam, or CXR evidence of pulmonary infection and so will not start abx (also essentially normal UA and no other source of infection identified despite slightly elevated WBC count so will not start any treatment for now)  HFrEF -No apparent repeat Echo since 2/16 and she did have a serious acute infection at that time -May be worth repeating Echo as an outpatient unless new symptoms develop -However, it is unlikely to change current or even long-term management -For now, CXR does not show evidence of volume overload, history is more c/w hypovolemia, and BNP could be elevated from abnl creatinine.   -Will not give additional Lasix at this time -That said, patient is receiving IVF and is tenuous and so she may require Lasix during this hospitalization  AKI on CKD -Baseline creatinine appears to be in 1.4-1.6 range.   -Creatinine on 7/16 was 1.91; it was 2.38 yesterday and is currently 2.03 -Will provide gentle hydration as above and continue to monitor with q12 BMP  RLE edema -Interesting pattern of  pedal edema and dorsal ecchymosis without known etiology -Suspect that patient had some kind of unappreciated trauma, possibly related to her current deconditioning -Denies pain, xrays  negative -PT consult requested, as patient is really unable to bear weight on either foot at this time  Hypokalemia -Repleted in ER -May need additional dosing, as she was also given Lasix in the ER -q12 hour BMP as above -Monitor on telemetry for now  DVT prophylaxis: Lovenox  Code Status: DNR - confirmed with patient/family Family Communication: Son and DIL present throughout and are in agreement with the plan Disposition Plan:  Likely to need SNF for rehab Consults called: None Admission status: Admit - telemetry   Jonah Blue MD [facility]  If 7PM-7AM, please contact night-coverage [email or web address] Password [email or web address]  11/06/2015, 10:07 PM

## 2015-11-06 NOTE — ED Notes (Signed)
Pt sent by Dr's office for admission and IV diuretics. Abnormal labs reported. Pt c/o generalized weakness. R leg and foot edema noted. Per Dr's office, pt is on diuretics but they have not been working.

## 2015-11-06 NOTE — ED Notes (Signed)
Attempted to call report, nurse unable to take report and stated that she would call back Anne Hahn( [place])

## 2015-11-06 NOTE — ED Provider Notes (Signed)
CSN: [id number]     Arrival date & time 11/06/15  1610 History   First MD Initiated Contact with Patient 11/06/15 1657     Chief Complaint  Patient presents with  . Abnormal Lab      HPI  Pt was seen at 1700. Per pt and her family, c/o gradual onset and worsening of persistence generalized weakness/fatigue for the past 2 weeks, worse over the past 4 days. Pt was evaluated in the ED and by her PMD x2 for same. Pt's PMD sent pt to the ED today for "admission for abnormal labs." Pt's family states pt has "gotten so weak she can't stand up" and has "slurred speech" for the past 4 days. Denies focal motor weakness, no N/V/D, no abd pain, no CP/SOB, no fevers.    Past Medical History  Diagnosis Date  . Essential hypertension   . Hypothyroidism   . Asthma   . Hyperlipidemia   . Gout   . CKD (chronic kidney disease) stage 3, GFR 30-59 ml/min   . Ascending aorta dilation (HCC)     4.2 cm 2011   . Atrial fibrillation [facility](HCC)     Documented February 2016 - question paroxysmal   . Severe malnutrition (HCC)   . Secondary cardiomyopathy (HCC)   . COPD (chronic obstructive pulmonary disease) (HCC)   . CHF (congestive heart failure) (HCC)   . On home O2    Past Surgical History  Procedure Laterality Date  . Rotator cuff repair     Family History  Problem Relation Age of Onset  . Heart attack Father    Social History  Substance Use Topics  . Smoking status: Never Smoker   . Smokeless tobacco: Never Used  . Alcohol Use: No   OB History    Gravida Para Term Preterm AB TAB SAB Ectopic Multiple Living   6 6 6       5      Review of Systems ROS: Statement: All systems negative except as marked or noted in the HPI; Constitutional: Negative for fever and chills. +generalized weakness/fatigue.; ; Eyes: Negative for eye pain, redness and discharge. ; ; ENMT: Negative for ear pain, hoarseness, nasal congestion, sinus pressure and sore throat. ; ; Cardiovascular: Negative for chest pain,  palpitations, diaphoresis, dyspnea and peripheral edema. ; ; Respiratory: Negative for cough, wheezing and stridor. ; ; Gastrointestinal: Negative for nausea, vomiting, diarrhea, abdominal pain, blood in stool, hematemesis, jaundice and rectal bleeding. . ; ; Genitourinary: Negative for dysuria, flank pain and hematuria. ; ; Musculoskeletal: Negative for back pain and neck pain. Negative for swelling and trauma.; ; Skin: Negative for pruritus, rash, abrasions, blisters, bruising and skin lesion.; ; Neuro: Negative for headache, lightheadedness and neck stiffness. Negative for altered level of consciousness, altered mental status, extremity weakness, paresthesias, involuntary movement, seizure and syncope.      Allergies  Lisinopril; Sulfa antibiotics; Ciprofloxacin; and Macrobid  Home Medications   Prior to Admission medications   Medication Sig Start Date End Date Taking? Authorizing Provider  allopurinol (ZYLOPRIM) 100 MG tablet TAKE TWO TABLETS BY MOUTH DAILY 10/15/15  Yes Ernestina Pennaonald W Moore, MD  aspirin EC 81 MG tablet Take 81 mg by mouth daily.   Yes Historical Provider, MD  AZOPT 1 % ophthalmic suspension INSTILL ONE DROP IN RIGHT EYE TWICE DAILY AS DIRECTED 10/17/15  Yes Ernestina Pennaonald W Moore, MD  brimonidine (ALPHAGAN) 0.2 % ophthalmic solution Place 1 drop into both eyes daily.   Yes Historical Provider, MD  Cranberry 500 MG CAPS Take 1 capsule by mouth daily.   Yes Historical Provider, MD  diltiazem (CARDIZEM CD) 240 MG 24 hr capsule TAKE ONE (1) CAPSULE EACH DAY 11/04/15  Yes Ernestina Penna, MD  famotidine (PEPCID) 20 MG tablet Take 1 tablet (20 mg total) by mouth 2 (two) times daily. 11/02/15  Yes Jacalyn Lefevre, MD  feeding supplement, ENSURE COMPLETE, (ENSURE COMPLETE) LIQD Take 237 mLs by mouth 3 (three) times daily between meals. 05/22/2014  Yes Standley Brooking, MD  furosemide (LASIX) 20 MG tablet TAKE ONE (1) TABLET EACH DAY 05/07/15  Yes Ernestina Penna, MD  ipratropium (ATROVENT) 0.02 %  nebulizer solution NEBULIZE 1 VIAL EVERY 6 HOURS 07/31/15  Yes Ernestina Penna, MD  levalbuterol Pauline Aus) 0.63 MG/3ML nebulizer solution NEBULIZE 1 VIAL EVERY 6 HOURS 10/24/15  Yes Ernestina Penna, MD  levothyroxine (SYNTHROID, LEVOTHROID) 75 MCG tablet TAKE ONE (1) TABLET EACH DAY 11/04/15  Yes Ernestina Penna, MD  loratadine (CLARITIN) 10 MG tablet Take 10 mg by mouth daily.   Yes Historical Provider, MD  LORazepam (ATIVAN) 0.5 MG tablet TAKE ONE TABLET TWICE DAILY AS NEEDED 11/04/15  Yes Ernestina Penna, MD  Multiple Vitamin (MULTIVITAMIN WITH MINERALS) TABS tablet Take 1 tablet by mouth daily.   Yes Historical Provider, MD  pantoprazole (PROTONIX) 40 MG tablet TAKE ONE (1) TABLET EACH DAY 07/04/15  Yes Frederica Kuster, MD  potassium chloride SA (K-DUR,KLOR-CON) 20 MEQ tablet TAKE ONE (1) TABLET EACH DAY 07/04/15  Yes Frederica Kuster, MD  sertraline (ZOLOFT) 50 MG tablet TAKE 1/2 TABLET DAILY 10/15/15  Yes Ernestina Penna, MD  SYMBICORT 160-4.5 MCG/ACT inhaler USE 2 PUFFS TWICE DAILY 10/31/15  Yes Ernestina Penna, MD  TRAVATAN Z 0.004 % SOLN ophthalmic solution PLACE 1 DROP IN RIGHT EYE AT BEDTIME 10/17/15  Yes Ernestina Penna, MD  trimethoprim (TRIMPEX) 100 MG tablet Take 100 mg by mouth daily.  12/12/14  Yes Historical Provider, MD  levofloxacin (LEVAQUIN) 250 MG tablet Take 500mg  first day, 250mg  q24h for total treatment of days. Patient not taking: Reported on 11/06/2015 11/05/15   Johna Sheriff, MD   BP 156/74 mmHg  Pulse 51  Temp(Src) 97.3 F (36.3 C) (Temporal)  Resp 30  Ht 5\' 3"  (1.6 m)  Wt 196 lb (88.905 kg)  BMI 34.73 kg/m2  SpO2 97%   19:06 Orthostatic Vital Signs DF  Orthostatic Lying  - BP- Lying: 144/105 mmHg ; Pulse- Lying: 96  Orthostatic Sitting - BP- Sitting: 150/100 mmHg ; Pulse- Sitting: 105  Orthostatic Standing at 0 minutes - BP- Standing at 0 minutes:  (Unable to obatin BP standing patient got weak was put back in the bed) ; Pulse- Standing at 0 minutes: 111       Physical  Exam  1705: Physical examination:  Nursing notes reviewed; Vital signs and O2 SAT reviewed;  Constitutional: Well developed, Well hydrated, In no acute distress; Head:  Normocephalic, atraumatic; Eyes: EOMI, PERRL, No scleral icterus; ENMT: Mouth and pharynx normal, Mucous membranes dry; Neck: Supple, Full range of motion, No lymphadenopathy; Cardiovascular: Irregular rate and rhythm, No gallop; Respiratory: Breath sounds coarse & equal bilaterally, No wheezes.  Speaking full sentences with ease, Normal respiratory effort/excursion; Chest: Nontender, Movement normal; Abdomen: Soft, Nontender, Nondistended, Normal bowel sounds; Genitourinary: No CVA tenderness; Extremities: Pulses normal, No tenderness, +2 pedal edema with R>L calf asymmetry. +bruise to dorsal right foot.; Neuro: AA&Ox3, vague historian. Major CN grossly intact.  Speech clear. No gross focal motor deficits in extremities.; Skin: Color normal, Warm, Dry.   ED Course  Procedures (including critical care time) Labs Review  Imaging Review  I have personally reviewed and evaluated these images and lab results as part of my medical decision-making.   EKG Interpretation   Date/Time:  Thursday November 06 2015 19:04:26 EDT Ventricular Rate:  112 PR Interval:    QRS Duration: 124 QT Interval:  364 QTC Calculation: 412 R Axis:   -8 Text Interpretation:  Atrial fibrillation Paired ventricular premature  complexes Left bundle branch block Left axis deviation Baseline wander  Artifact When compared with ECG of 05/28/2014 No significant change was  found Confirmed by [facility]  MD, Nicholos Johns [phone]) on 11/06/2015 8:00:14 PM      MDM  MDM Reviewed: previous chart, nursing note and vitals Reviewed previous: labs and ECG Interpretation: labs, ECG, x-ray, ultrasound and CT scan     Results for orders placed or performed during the hospital encounter of 11/06/15  Urinalysis, Routine w reflex microscopic  Result Value Ref Range   Color,  Urine STRAW (A) YELLOW   APPearance CLEAR CLEAR   Specific Gravity, Urine 1.015 1.005 - 1.030   pH 5.0 5.0 - 8.0   Glucose, UA NEGATIVE NEGATIVE mg/dL   Hgb urine dipstick NEGATIVE NEGATIVE   Bilirubin Urine NEGATIVE NEGATIVE   Ketones, ur NEGATIVE NEGATIVE mg/dL   Protein, ur NEGATIVE NEGATIVE mg/dL   Nitrite NEGATIVE NEGATIVE   Leukocytes, UA NEGATIVE NEGATIVE  Comprehensive metabolic panel  Result Value Ref Range   Sodium 127 (L) 135 - 145 mmol/L   Potassium 2.9 (L) 3.5 - 5.1 mmol/L   Chloride 88 (L) 101 - 111 mmol/L   CO2 28 22 - 32 mmol/L   Glucose, Bld 115 (H) 65 - 99 mg/dL   BUN 33 (H) 6 - 20 mg/dL   Creatinine, Ser 6.04 (H) 0.44 - 1.00 mg/dL   Calcium 8.7 (L) 8.9 - 10.3 mg/dL   Total Protein 6.8 6.5 - 8.1 g/dL   Albumin 3.5 3.5 - 5.0 g/dL   AST 33 15 - 41 U/L   ALT 35 14 - 54 U/L   Alkaline Phosphatase 72 38 - 126 U/L   Total Bilirubin 0.6 0.3 - 1.2 mg/dL   GFR calc non Af Amer 20 (L) >60 mL/min   GFR calc Af Amer 23 (L) >60 mL/min   Anion gap 11 5 - 15  Troponin I  Result Value Ref Range   Troponin I 0.06 (HH) <0.03 ng/mL  Brain natriuretic peptide  Result Value Ref Range   B Natriuretic Peptide 1178.0 (H) 0.0 - 100.0 pg/mL  Lactic acid, plasma  Result Value Ref Range   Lactic Acid, Venous 1.1 0.5 - 1.9 mmol/L  CBC with Differential  Result Value Ref Range   WBC 13.1 (H) 4.0 - 10.5 K/uL   RBC 4.03 3.87 - 5.11 MIL/uL   Hemoglobin 12.6 12.0 - 15.0 g/dL   HCT 54.0 98.1 - 19.1 %   MCV 93.8 78.0 - 100.0 fL   MCH 31.3 26.0 - 34.0 pg   MCHC 33.3 30.0 - 36.0 g/dL   RDW 47.8 29.5 - 62.1 %   Platelets 250 150 - 400 K/uL   Neutrophils Relative % 75 %   Neutro Abs 9.8 (H) 1.7 - 7.7 K/uL   Lymphocytes Relative 12 %   Lymphs Abs 1.6 0.7 - 4.0 K/uL   Monocytes Relative 11 %   Monocytes Absolute 1.4 (  H) 0.1 - 1.0 K/uL   Eosinophils Relative 2 %   Eosinophils Absolute 0.3 0.0 - 0.7 K/uL   Basophils Relative 0 %   Basophils Absolute 0.0 0.0 - 0.1 K/uL   Dg Chest 2  View 11/06/2015  CLINICAL DATA:  Generalized weakness. Right leg swelling. Congestive heart failure. Atrial fibrillation. COPD. EXAM: CHEST  2 VIEW COMPARISON:  10/24/2015 FINDINGS: Cardiomegaly remains stable. Ectasia and atherosclerotic calcification of thoracic aorta appears unchanged. Bibasilar pulmonary parenchymal scarring again noted kappa however there is no evidence of pulmonary edema or consolidation. No evidence of pneumothorax or pleural effusion. Pulmonary hyperinflation again noted. IMPRESSION: Stable cardiomegaly and bilateral pulmonary scarring. No active lung disease. Aortic atherosclerosis noted. Electronically Signed   By: Myles Rosenthal M.D.   On: 11/06/2015 18:25   Dg Ankle Complete Right 11/06/2015  CLINICAL DATA:  Right ankle swelling and contusion. EXAM: RIGHT ANKLE - COMPLETE 3+ VIEW COMPARISON:  None. FINDINGS: There is no evidence of fracture, dislocation, or joint effusion. Generalized osteopenia noted, but no focal bone abnormality identified. Diffuse soft tissue swelling noted. Extensive peripheral vascular calcification also seen. IMPRESSION: Diffuse soft tissue swelling.  No evidence of fracture . Electronically Signed   By: Myles Rosenthal M.D.   On: 11/06/2015 18:28   Ct Head Wo Contrast 11/06/2015  CLINICAL DATA:  Slurred speech and weakness for 4 days. EXAM: CT HEAD WITHOUT CONTRAST TECHNIQUE: Contiguous axial images were obtained from the base of the skull through the vertex without intravenous contrast. COMPARISON:  04/02/2014 FINDINGS: There is no evidence of intracranial hemorrhage, brain edema, or other signs of acute infarction. There is no evidence of intracranial mass lesion or mass effect. No abnormal extraaxial fluid collections are identified. Mild diffuse cerebral and cerebellar atrophy and chronic small vessel disease are stable in appearance. No evidence of hydrocephalus. No skull abnormality identified. IMPRESSION: No acute intracranial abnormality. Stable cerebral and  cerebellar atrophy, and chronic small vessel disease. Electronically Signed   By: Myles Rosenthal M.D.   On: 11/06/2015 19:25    US Venous Img Lower Unilateral Right 11/06/2015  CLINICAL DATA:  Swelling EXAM: RIGHT LOWER EXTREMITY VENOUS DOPPLER ULTRASOUND TECHNIQUE: Gray-scale sonography with graded compression, as well as color Doppler and duplex ultrasound were performed to evaluate the lower extremity deep venous systems from the level of the common femoral vein and including the common femoral, femoral, profunda femoral, popliteal and calf veins including the posterior tibial, peroneal and gastrocnemius veins when visible. The superficial great saphenous vein was also interrogated. Spectral Doppler was utilized to evaluate flow at rest and with distal augmentation maneuvers in the common femoral, femoral and popliteal veins. COMPARISON:  None. FINDINGS: Contralateral Common Femoral Vein: Respiratory phasicity is normal and symmetric with the symptomatic side. No evidence of thrombus. Normal compressibility. Common Femoral Vein: No evidence of thrombus. Normal compressibility, respiratory phasicity and response to augmentation. Saphenofemoral Junction: No evidence of thrombus. Normal compressibility and flow on color Doppler imaging. Profunda Femoral Vein: No evidence of thrombus. Normal compressibility and flow on color Doppler imaging. Femoral Vein: No evidence of thrombus. Normal compressibility, respiratory phasicity and response to augmentation. Popliteal Vein: No evidence of thrombus. Normal compressibility, respiratory phasicity and response to augmentation. Calf Veins: Poor visualization of the calf veins and peroneal vein. Superficial Great Saphenous Vein: Poor visualization of the greater saphenous vein. Venous Reflux:  None. Other Findings:  None. IMPRESSION: Evaluation of the calf veins and greater saphenous veins is limited. No DVT seen from the right common femoral  vein through the popliteal vein.  Electronically Signed   By: Gerome Sam III M.D   On: 11/06/2015 18:48   Dg Foot Complete Right 11/06/2015  CLINICAL DATA:  Right foot swelling and contusion. EXAM: RIGHT FOOT COMPLETE - 3+ VIEW COMPARISON:  None. FINDINGS: There is no evidence of acute fracture or dislocation. Severe hallux valgus with bony bunion formation noted. Generalized osteopenia. No focal lytic or sclerotic bone lesions. Small calcaneal bone spurs noted. Peripheral vascular calcification also seen. IMPRESSION: No acute findings. Severe hallux valgus and bony bunion. Electronically Signed   By: Myles Rosenthal M.D.   On: 11/06/2015 18:30   Results for JAZLYNE, GAUGER (MRN [id number]) as of 11/06/2015 20:01  Ref. Range 12/27/2014 12:32 10/30/2015 15:47 11/02/2015 12:26 11/05/2015 18:14 11/06/2015 17:31  Sodium Latest Ref Range: 135-145 mmol/L 142 133 (L) 128 (L) 127 (L) 127 (L)  BUN Latest Ref Range: 6-20 mg/dL 25 25 27  (H) 34 33 (H)  Creatinine Latest Ref Range: 0.44-1.00 mg/dL 1.47 (H) 8.29 (H) 5.62 (H) 2.38 (H) 2.03 (H)    Results for AUBRYANA, VITTORIO (MRN [id number]) as of 11/06/2015 20:01  Ref. Range 05/30/2014 05:22 05/31/2014 05:18 06/01/2014 14:36 06/02/2014 04:45 11/06/2015 17:31  B Natriuretic Peptide Latest Ref Range: 0.0-100.0 pg/mL 1025.0 (H) 939.0 (H) 1401.0 (H) 816.0 (H) 1178.0 (H)    2020:  Pt unable to stand in the ED (pt's family states her baseline is standing and walking some with a walker).  Potassium repleted PO.  BUN/Cr and troponin per baseline. Na trending downward. BNP elevated; will dose IV lasix. Dx and testing d/w pt and family.  Questions answered.  Verb understanding, agreeable to admit. T/C to [facility] Dr. Ophelia Charter, case discussed, including:  HPI, pertinent PM/SHx, VS/PE, dx testing, ED course and treatment:  Agreeable to admit, requests to write temporary orders, obtain inpt tele bed to team [facility]mits.   Samuel Jester, DO 11/08/15 2149

## 2015-11-06 NOTE — Telephone Encounter (Signed)
Called by DR. Oswaldo DoneVincent regarding Daisy Scott, she is a pleasant 82 year old whom she saw yesterday in clinic with weakness, fatigue and edema, she has a history of systolic heart failure the most recent EF 25-30%, COPD on chronic oxygen, she's been getting weaker and weaker home. She did blood work yesterday which revealed elevated BNP to 1679 , hyponatremia with a sodium of 113, and acute on chronic renal failure with a creatinine of 2.38 from 1.94 days ago. Will come via emergency room.  Costin M. Elvera LennoxGherghe, MD [facility] [phone]

## 2015-11-06 NOTE — ED Notes (Signed)
Returned from xray dept.

## 2015-11-07 ENCOUNTER — Encounter (HOSPITAL_COMMUNITY): Payer: Self-pay | Admitting: Internal Medicine

## 2015-11-07 DIAGNOSIS — E876 Hypokalemia: Secondary | ICD-10-CM

## 2015-11-07 DIAGNOSIS — R600 Localized edema: Secondary | ICD-10-CM

## 2015-11-07 DIAGNOSIS — M1711 Unilateral primary osteoarthritis, right knee: Secondary | ICD-10-CM

## 2015-11-07 DIAGNOSIS — J9611 Chronic respiratory failure with hypoxia: Secondary | ICD-10-CM

## 2015-11-07 DIAGNOSIS — I519 Heart disease, unspecified: Secondary | ICD-10-CM

## 2015-11-07 DIAGNOSIS — N183 Chronic kidney disease, stage 3 (moderate): Secondary | ICD-10-CM

## 2015-11-07 DIAGNOSIS — I480 Paroxysmal atrial fibrillation: Secondary | ICD-10-CM

## 2015-11-07 DIAGNOSIS — N179 Acute kidney failure, unspecified: Secondary | ICD-10-CM

## 2015-11-07 LAB — BASIC METABOLIC PANEL
Anion gap: 13 (ref 5–15)
Anion gap: 14 (ref 5–15)
BUN: 31 mg/dL — ABNORMAL HIGH (ref 6–20)
BUN: 33 mg/dL — ABNORMAL HIGH (ref 6–20)
CALCIUM: 8.9 mg/dL (ref 8.9–10.3)
CALCIUM: 8.6 mg/dL — AB (ref 8.9–10.3)
CO2: 29 mmol/L (ref 22–32)
CO2: 29 mmol/L (ref 22–32)
CREATININE: 1.9 mg/dL — AB (ref 0.44–1.00)
CREATININE: 2.03 mg/dL — AB (ref 0.44–1.00)
Chloride: 86 mmol/L — ABNORMAL LOW (ref 101–111)
Chloride: 88 mmol/L — ABNORMAL LOW (ref 101–111)
GFR calc non Af Amer: 20 mL/min — ABNORMAL LOW (ref >60)
GFR calc non Af Amer: 21 mL/min — ABNORMAL LOW (ref >60)
GFR, EST AFRICAN AMERICAN: 25 mL/min — AB (ref >60)
GFR, EST AFRICAN AMERICAN: 23 mL/min — AB (ref >60)
Glucose, Bld: 102 mg/dL — ABNORMAL HIGH (ref 65–99)
Glucose, Bld: 104 mg/dL — ABNORMAL HIGH (ref 65–99)
Potassium: 2.7 mmol/L — CL (ref 3.5–5.1)
Potassium: 3 mmol/L — ABNORMAL LOW (ref 3.5–5.1)
SODIUM: 128 mmol/L — AB (ref 135–145)
Sodium: 131 mmol/L — ABNORMAL LOW (ref 135–145)

## 2015-11-07 LAB — CBC
HCT: 40.6 % (ref 36.0–46.0)
Hemoglobin: 13.3 g/dL (ref 12.0–15.0)
MCH: 30.8 pg (ref 26.0–34.0)
MCHC: 32.8 g/dL (ref 30.0–36.0)
MCV: 94 fL (ref 78.0–100.0)
PLATELETS: 282 10*3/uL (ref 150–400)
RBC: 4.32 MIL/uL (ref 3.87–5.11)
RDW: 15.1 % (ref 11.5–15.5)
WBC: 14.2 10*3/uL — AB (ref 4.0–10.5)

## 2015-11-07 LAB — TSH: TSH: 1.102 u[IU]/mL (ref 0.350–4.500)

## 2015-11-07 LAB — VITAMIN B12: VITAMIN B 12: 3415 pg/mL — AB (ref 180–914)

## 2015-11-07 LAB — MAGNESIUM: Magnesium: 1.7 mg/dL (ref 1.7–2.4)

## 2015-11-07 LAB — T4, FREE: Free T4: 1.81 ng/dL — ABNORMAL HIGH (ref 0.61–1.12)

## 2015-11-07 MED ORDER — PREDNISONE 20 MG PO TABS
30.0000 mg | ORAL_TABLET | Freq: Every day | ORAL | Status: DC
  Administered 2015-11-08 – 2015-11-10 (×3): 30 mg via ORAL
  Filled 2015-11-07 (×3): qty 1

## 2015-11-07 MED ORDER — DICLOFENAC SODIUM 1 % TD GEL
4.0000 g | Freq: Three times a day (TID) | TRANSDERMAL | Status: DC
  Administered 2015-11-07 – 2015-11-11 (×11): 4 g via TOPICAL
  Filled 2015-11-07: qty 100

## 2015-11-07 MED ORDER — METHYLPREDNISOLONE SODIUM SUCC 40 MG IJ SOLR
40.0000 mg | Freq: Once | INTRAMUSCULAR | Status: AC
  Administered 2015-11-07: 40 mg via INTRAVENOUS
  Filled 2015-11-07: qty 1

## 2015-11-07 MED ORDER — POTASSIUM CHLORIDE CRYS ER 10 MEQ PO TBCR
30.0000 meq | EXTENDED_RELEASE_TABLET | Freq: Two times a day (BID) | ORAL | Status: AC
  Administered 2015-11-07 – 2015-11-09 (×6): 30 meq via ORAL
  Filled 2015-11-07 (×6): qty 1

## 2015-11-07 MED ORDER — SENNOSIDES-DOCUSATE SODIUM 8.6-50 MG PO TABS
1.0000 | ORAL_TABLET | Freq: Two times a day (BID) | ORAL | Status: DC
  Administered 2015-11-07 – 2015-11-11 (×9): 1 via ORAL
  Filled 2015-11-07 (×9): qty 1

## 2015-11-07 MED ORDER — IPRATROPIUM-ALBUTEROL 0.5-2.5 (3) MG/3ML IN SOLN
3.0000 mL | Freq: Three times a day (TID) | RESPIRATORY_TRACT | Status: DC
  Administered 2015-11-07 – 2015-11-08 (×4): 3 mL via RESPIRATORY_TRACT
  Filled 2015-11-07 (×4): qty 3

## 2015-11-07 NOTE — Progress Notes (Signed)
CRITICAL VALUE ALERT  Critical value received:  Potassium 2.7 Date of notification:  11/07/2015  Time of notification:  1140  Critical value read back:Yes.    Nurse who received alert:  Jonetta SpeakElla Ardel Jagger RN  MD notified (1st page):  Dr. Sherrie MustacheFisher Time of first page:  1150  MD notified (2nd page):  Time of second page:  Responding MD:  Dr. Sherrie MustacheFisher  Time MD responded: 1150

## 2015-11-07 NOTE — Care Management Note (Signed)
Case Management Note  Patient Details  Name: Daisy Scott MRN: [id number] Date of Birth: 12/08/1918  Subjective/Objective:  Patient from home, has 24 hour supervision. [facility], only eval visit has been completed. Will order resumption of services.           Action/Plan: DC home with resumption of home health.   Expected Discharge Date:  11/09/15               Expected Discharge Plan:  Home w Home Health Services  In-House Referral:  NA  Discharge planning Services  CM Consult  Post Acute Care Choice:  Home Health, Resumption of Svcs/PTA Provider Choice offered to:     DME Arranged:    DME Agency:     HH Arranged:    HH [facility]  Status of Service:  In process, will continue to follow  If discussed at Long Length of Stay Meetings, dates discussed:    Additional Comments:  Ramie Erman, Chrystine OilerSharley Diane, RN 11/07/2015, 12:56 PM

## 2015-11-07 NOTE — Progress Notes (Signed)
PROGRESS NOTE    Daisy Scott  ZOX:[id number] DOB: 11-28-1918 DOA: 11/06/2015 PCP: Rudi Heap, MD    Brief Narrative:  Patient is a 82 year old woman with a history of chronic systolic (EF 25-30%) CHF, stage III CKD, PAF on aspirin, HTN, and chronic hypoxic respiratory failure on home oxygen due to asthma/COPD, who presented to the ED on 11/06/2015 for generalized weakness. The patient had generalized weakness for 2 weeks. She had had intermittent nausea and vomiting. She was treated with an antibiotic for UTI 3 weeks ago. She had swelling in her legs right greater than left. She saw her PCP, Dr. Oswaldo Done who was concerned about a spot on her right foot. Apparently blood work was done and the patient was instructed to come to the ED for further evaluation of abnormal lab results. In the ED, she was afebrile and intermittently bradycardic and mildly hypertensive. Her lab data were significant for sodium of 127, potassium of 2.9, creatinine is 2.03, BNP of 1178, troponin I of 0.06, WBC of 13.1, and unremarkable urinalysis. CT of the head revealed no acute findings. Right foot x-ray revealed diffuse soft tissue swelling but no fractures. R LE venous ultrasound revealed no DVT. Chest x-ray revealed no active disease. She was admitted for further evaluation and management.  Assessment & Plan:   Principal Problem:   Hyponatremia Active Problems:   Hypertension   Hypothyroid   Asthma, chronic   Severe left ventricular systolic dysfunction   Acute renal failure superimposed on stage 3 chronic kidney disease (HCC)   Edema of right lower extremity   Hypokalemia   Chronic respiratory failure with hypoxia (HCC)   Osteoarthritis of right knee   Right lower extremity edema with osteoarthritis of the right knee and small effusion. Evaluation in the ED yielded soft tissue swelling on the x-ray of her right foot and no DVT on the venous Doppler study. On exam, she has a mild effusion and tenderness  of her right knee which likely is the culprit for her edema. There does not appear to be cellulitis. -We will treat with Solu-Medrol 1 and then a prednisone taper. Will also add Voltaren gel to her right knee 3 times a day. -We'll order PT eval.  Hyponatremia, likely from hypovolemia. Patient serum sodium was 127 on admission. She had been eating and drinking very little. She did not appear to be volume overloaded on exam with exception of right lower extremity swelling. She appeared to be volume depleted, but apparently received 40 mg of Lasix in the ED for edema in the right leg. -Gentle IV fluids were started with normal saline. Her serum sodium has improved. We'll continue current management.  Hypokalemia. Patient serum potassium was 2.9 on admission. She is on potassium chloride chronically due to chronic Lasix therapy. She was given Lasix in the ED for edema and her right leg and an elevated BNP. It has worsened her serum potassium. -We'll increase potassium chloride dosing and monitor.  Acute kidney injury superimposed on stage III-IVchronic kidney disease. Patient's creatinine was 2.03 on admission. Her creatinine 10/2014 was 1.9. It was 2.03 on admission. -She was started on gentle IV fluids. Lasix is being held for now. -Etiology likely prerenal azotemia.  Chronic systolic congestive heart failure. Patient's EF per echo 05/2014 revealed an EF of 20-25%. She is treated chronically with Lasix. Apparently, she has an allergy to ace inhibitors; would not start an ARB due to chronic kidney disease. She is not on a beta blocker, not sure why.  However, given her DO NOT RESUSCITATE status and being 82 years of age, I do not believe there is any utility or benefit in adding a beta blocker at this point. -She does not appear to be decompensated. -We'll restart Lasix as her mild volume depletion resolves and/or when her serum potassium has improved.  Paroxysmal atrial fibrillation. The patient  is treated with aspirin and diltiazem for rate control. She is not on an anticoagulant and I would not start one now given her age and debilitation.  Minimal elevation of troponin I. Not consistent with ACS.  Chronic respiratory failure with hypoxia presumed to be secondary to chronic asthma or COPD. The patient was restarted on 3-4 L of nasal cannula oxygen which is her usual. Symbicort was restarted. DuoNeb nebs were started 3 times a day.  There are no wheezes on exam.  Hypothyroidism. The patient was restarted on Synthroid. TSH was within normal limits.  Chronic UTIs. The patient was restarted on chronic trimethoprim treatment. Her urinalysis on admission was unremarkable.  Generalized weakness. Likely multifactorial due to all the above. Will check a vitamin B12.   DVT prophylaxis: Lovenox Code Status: DO NOT RESUSCITATE Family Communication: Discussed with daughters Disposition Plan: Discharge to home with PT versus short-term skilled nursing facility placement. Family prefers the patient to be discharged to home. Likely discharge in 3 days.   Consultants:   None  Procedures:   None  Antimicrobials:   None    Subjective: Patient has no complaints of chest pain, shortness of breath, or abdominal pain. She is tolerating in shore and small bites of solids.  Objective: Filed Vitals:   11/07/15 0117 11/07/15 0511 11/07/15 0807 11/07/15 1020  BP:  124/99    Pulse: 66 62    Temp:  97.6 F (36.4 C)    TempSrc:  Oral    Resp: 18 17    Height:      Weight:      SpO2:  94% 87% 94%    Intake/Output Summary (Last 24 hours) at 11/07/15 1200 Last data filed at 11/07/15 0824  Gross per 24 hour  Intake 380.83 ml  Output    813 ml  Net -432.17 ml   Filed Weights   11/06/15 1622 11/06/15 2213  Weight: 88.905 kg (196 lb) 89.767 kg (197 lb 14.4 oz)    Examination:  General exam: Appears calm and comfortable; Morbidly obese 82 year old woman in no acute distress.    Respiratory system: Clear anteriorly with decreased breath sounds in the bases. Respiratory effort normal. Cardiovascular system: Irregular, irregular. Right leg/foot with 1+ nonpitting edema; trace nonpitting edema left foot. Gastrointestinal system: Abdomen is obese; nondistended, soft and nontender. No organomegaly or masses felt. Normal bowel sounds heard. Central nervous system: Alert and oriented. No focal neurological deficits. Extremities: Multiple varicosities of her lower legs. Mild to moderate effusion of the right knee with moderate tenderness and crepitus with flexion and extension; no appreciable erythema and minimal warmness. Skin: Varicosities and mild ecchymosis right dorsum. Psychiatry: Pleasant affect.     Data Reviewed: I have personally reviewed following labs and imaging studies  CBC:  Recent Labs Lab 11/02/15 1226 11/05/15 1814 11/06/15 1731 11/07/15 0448  WBC 15.4* 11.6* 13.1* 14.2*  NEUTROABS 12.9* 8.3* 9.8*  --   HGB 13.5  --  12.6 13.3  HCT 40.6 38.6 37.8 40.6  MCV 94.0 92 93.8 94.0  PLT 336 321 250 282   Basic Metabolic Panel:  Recent Labs Lab 11/02/15 1226 11/05/15  1814 11/06/15 1731 11/06/15 2340 11/07/15 1042  NA 128* 127* 127* 128* 131*  K 4.9 4.7 2.9* 3.0* 2.7*  CL 94* 85* 88* 86* 88*  CO2 25 24 28 29 29   GLUCOSE 124* 113* 115* 104* 102*  BUN 27* 34 33* 33* 31*  CREATININE 1.91* 2.38* 2.03* 2.03* 1.90*  CALCIUM 9.2 9.1 8.7* 8.6* 8.9  MG  --   --   --   --  1.7   GFR: Estimated Creatinine Clearance: 18.8 mL/min (by C-G formula based on Cr of 1.9). Liver Function Tests:  Recent Labs Lab 11/02/15 1226 11/06/15 1731  AST 33 33  ALT 24 35  ALKPHOS 78 72  BILITOT 0.4 0.6  PROT 7.1 6.8  ALBUMIN 3.6 3.5    Recent Labs Lab 11/02/15 1226  LIPASE 18   No results for input(s): AMMONIA in the last 168 hours. Coagulation Profile: No results for input(s): INR, PROTIME in the last 168 hours. Cardiac Enzymes:  Recent Labs Lab  11/02/15 1226 11/06/15 1731  TROPONINI 0.06* 0.06*   BNP (last 3 results) No results for input(s): PROBNP in the last 8760 hours. HbA1C: No results for input(s): HGBA1C in the last 72 hours. CBG: No results for input(s): GLUCAP in the last 168 hours. Lipid Profile: No results for input(s): CHOL, HDL, LDLCALC, TRIG, CHOLHDL, LDLDIRECT in the last 72 hours. Thyroid Function Tests:  Recent Labs  11/06/15 2359  TSH 1.102   Anemia Panel: No results for input(s): VITAMINB12, FOLATE, FERRITIN, TIBC, IRON, RETICCTPCT in the last 72 hours. Sepsis Labs:  Recent Labs Lab 11/02/15 1308 11/06/15 1731  LATICACIDVEN 1.2 1.1    Recent Results (from the past 240 hour(s))  Microscopic Examination     Status: Abnormal   Collection Time: 10/30/15  3:47 PM  Result Value Ref Range Status   WBC, UA 6-10 (A) 0 -  5 /hpf Final   RBC, UA 0-2 0 -  2 /hpf Final   Epithelial Cells (non renal) >10 (A) 0 - 10 /hpf Final   Renal Epithel, UA >10 (A) None seen /hpf Final   Bacteria, UA Few None seen/Few Final         Radiology Studies: Dg Chest 2 View  11/06/2015  CLINICAL DATA:  Generalized weakness. Right leg swelling. Congestive heart failure. Atrial fibrillation. COPD. EXAM: CHEST  2 VIEW COMPARISON:  10/24/2015 FINDINGS: Cardiomegaly remains stable. Ectasia and atherosclerotic calcification of thoracic aorta appears unchanged. Bibasilar pulmonary parenchymal scarring again noted kappa however there is no evidence of pulmonary edema or consolidation. No evidence of pneumothorax or pleural effusion. Pulmonary hyperinflation again noted. IMPRESSION: Stable cardiomegaly and bilateral pulmonary scarring. No active lung disease. Aortic atherosclerosis noted. Electronically Signed   By: Myles Rosenthal M.D.   On: 11/06/2015 18:25   Dg Ankle Complete Right  11/06/2015  CLINICAL DATA:  Right ankle swelling and contusion. EXAM: RIGHT ANKLE - COMPLETE 3+ VIEW COMPARISON:  None. FINDINGS: There is no evidence  of fracture, dislocation, or joint effusion. Generalized osteopenia noted, but no focal bone abnormality identified. Diffuse soft tissue swelling noted. Extensive peripheral vascular calcification also seen. IMPRESSION: Diffuse soft tissue swelling.  No evidence of fracture . Electronically Signed   By: Myles Rosenthal M.D.   On: 11/06/2015 18:28   Ct Head Wo Contrast  11/06/2015  CLINICAL DATA:  Slurred speech and weakness for 4 days. EXAM: CT HEAD WITHOUT CONTRAST TECHNIQUE: Contiguous axial images were obtained from the base of the skull through the vertex without  intravenous contrast. COMPARISON:  04/02/2014 FINDINGS: There is no evidence of intracranial hemorrhage, brain edema, or other signs of acute infarction. There is no evidence of intracranial mass lesion or mass effect. No abnormal extraaxial fluid collections are identified. Mild diffuse cerebral and cerebellar atrophy and chronic small vessel disease are stable in appearance. No evidence of hydrocephalus. No skull abnormality identified. IMPRESSION: No acute intracranial abnormality. Stable cerebral and cerebellar atrophy, and chronic small vessel disease. Electronically Signed   By: Myles Rosenthal M.D.   On: 11/06/2015 19:25   US Venous Img Lower Unilateral Right  11/06/2015  CLINICAL DATA:  Swelling EXAM: RIGHT LOWER EXTREMITY VENOUS DOPPLER ULTRASOUND TECHNIQUE: Gray-scale sonography with graded compression, as well as color Doppler and duplex ultrasound were performed to evaluate the lower extremity deep venous systems from the level of the common femoral vein and including the common femoral, femoral, profunda femoral, popliteal and calf veins including the posterior tibial, peroneal and gastrocnemius veins when visible. The superficial great saphenous vein was also interrogated. Spectral Doppler was utilized to evaluate flow at rest and with distal augmentation maneuvers in the common femoral, femoral and popliteal veins. COMPARISON:  None.  FINDINGS: Contralateral Common Femoral Vein: Respiratory phasicity is normal and symmetric with the symptomatic side. No evidence of thrombus. Normal compressibility. Common Femoral Vein: No evidence of thrombus. Normal compressibility, respiratory phasicity and response to augmentation. Saphenofemoral Junction: No evidence of thrombus. Normal compressibility and flow on color Doppler imaging. Profunda Femoral Vein: No evidence of thrombus. Normal compressibility and flow on color Doppler imaging. Femoral Vein: No evidence of thrombus. Normal compressibility, respiratory phasicity and response to augmentation. Popliteal Vein: No evidence of thrombus. Normal compressibility, respiratory phasicity and response to augmentation. Calf Veins: Poor visualization of the calf veins and peroneal vein. Superficial Great Saphenous Vein: Poor visualization of the greater saphenous vein. Venous Reflux:  None. Other Findings:  None. IMPRESSION: Evaluation of the calf veins and greater saphenous veins is limited. No DVT seen from the right common femoral vein through the popliteal vein. Electronically Signed   By: Gerome Sam III M.D   On: 11/06/2015 18:48   Dg Foot Complete Right  11/06/2015  CLINICAL DATA:  Right foot swelling and contusion. EXAM: RIGHT FOOT COMPLETE - 3+ VIEW COMPARISON:  None. FINDINGS: There is no evidence of acute fracture or dislocation. Severe hallux valgus with bony bunion formation noted. Generalized osteopenia. No focal lytic or sclerotic bone lesions. Small calcaneal bone spurs noted. Peripheral vascular calcification also seen. IMPRESSION: No acute findings. Severe hallux valgus and bony bunion. Electronically Signed   By: Myles Rosenthal M.D.   On: 11/06/2015 18:30        Scheduled Meds: . aspirin EC  81 mg Oral Daily  . brimonidine  1 drop Both Eyes Daily  . brinzolamide  1 drop Right Eye BID  . diltiazem  240 mg Oral Daily  . enoxaparin (LOVENOX) injection  30 mg Subcutaneous Q24H  .  famotidine  20 mg Oral BID  . feeding supplement (ENSURE ENLIVE)  237 mL Oral TID BM  . ipratropium-albuterol  3 mL Nebulization TID  . levothyroxine  75 mcg Oral QAC breakfast  . loratadine  10 mg Oral Daily  . mometasone-formoterol  2 puff Inhalation BID  . potassium chloride  30 mEq Oral BID  . sodium chloride flush  3 mL Intravenous Q12H  . trimethoprim  100 mg Oral Daily   Continuous Infusions: . sodium chloride 50 mL/hr at 11/06/15 2359  LOS: 1 day    Time spent: 40 minutes    Elliot CousinFISHER,Lason Eveland, MD [facility] Pager [phone]  If 7PM-7AM, please contact night-coverage [email or web address] Password [email or web address] 11/07/2015, 12:00 PM

## 2015-11-07 NOTE — Evaluation (Signed)
Physical Therapy Evaluation Patient Details Name: Daisy Scott MRN: [id number] DOB: 06-Jul-1918 Today's Date: 11/07/2015   History of Present Illness  82 yo F admitted 11/06/2015 for weakness x 2 weeks. Recent trip to ER found large hiatal hernia and had improvement with Pepcid. Still with loss of appetite, difficulty moving limbs and slurred speech. Saw Dr. Oswaldo DoneVincent the night before for a large spot on the dorsum of her foot they were concerned about. Dr.'s office called them back and asked her to come into the ER. Pt has R LE edema. Increased O2 to 4L at home over the last 2 weeks. Head CT (-) for acute findings, R LE US (-) for DVT, R ankle XR (-) fx, but shows soft tissue swelling and extensive peripheral vascular calcification. Dx: hyponatremia. PMH: HTN, asthma, CKD stage 3, CHF with EF of 25-30%, Afib, hypothyroidism, CKD stage 3, severe malnutrition, secondary cardiomyopathy, COPD, CHF, home O2, hiatal hernia, rotator cuff repair.   Clinical Impression  Pt received in bed, dtr and son in law arrived during evaluation, and pt is agreeable to PT evaluation.  Dtr expressed that the pt has 24/7 caregivers, and requires assistance for transfers, and ADL's, at baseline, however, recently, she has required more assistance.  Today during PT evaluation she required Mod A for supine<>sit, and Mod A for sit<>stand<>chair with RW. Pt fatigued at end of tx.  At this point, recommend that she return home with 24/7 caregivers and have HHPT come out to progress strength, and transfers, as well as caregiver and family training.     Follow Up Recommendations Home health PT;Supervision/Assistance - 24 hour    Equipment Recommendations  None recommended by PT    Recommendations for Other Services       Precautions / Restrictions Precautions Precautions: Fall Restrictions Weight Bearing Restrictions: No      Mobility  Bed Mobility Overal bed mobility: Needs Assistance Bed Mobility: Supine  to Sit     Supine to sit: Mod assist;HOB elevated        Transfers Overall transfer level: Needs assistance Equipment used: Rolling walker (2 wheeled) Transfers: Sit to/from UGI CorporationStand;Stand Pivot Transfers Sit to Stand: Mod assist Stand pivot transfers: Mod assist       General transfer comment: Pt needs encouragement and cues not to sit prematurely.   Ambulation/Gait Ambulation/Gait assistance:  (NA - pt is w/c bound at baseline. )              Stairs            Wheelchair Mobility    Modified Rankin (Stroke Patients Only)       Balance Overall balance assessment: Needs assistance         Standing balance support: Bilateral upper extremity supported Standing balance-Leahy Scale: Fair                               Pertinent Vitals/Pain Pain Assessment: No/denies pain    Home Living Family/patient expects to be discharged to:: Private residence Living Arrangements:  (24 hr caregivers) Available Help at Discharge: Personal care attendant;Available 24 hours/day Type of Home: House Home Access: Level entry     Home Layout: Multi-level;Able to live on main level with bedroom/bathroom Home Equipment: Dan HumphreysWalker - 2 wheels;Shower seat - built in;Bedside commode;Hospital bed;Wheelchair - manual;Other (comment) (bedpan, lift chair. )      Prior Function Level of Independence: Needs assistance   Gait / Transfers Assistance Needed:  Pt requires assistance for transfers.  Dtr states that she does not use a RW for her transfer into the w/c.  Dtr states that the caregivers push her in the w/c.   ADL's / Homemaking Assistance Needed: Pt requires assistance for all ADL's.         Hand Dominance        Extremity/Trunk Assessment   Upper Extremity Assessment: Generalized weakness           Lower Extremity Assessment: Generalized weakness         Communication   Communication: No difficulties  Cognition Arousal/Alertness:  Awake/alert Behavior During Therapy: WFL for tasks assessed/performed Overall Cognitive Status: History of cognitive impairments - at baseline                      General Comments      Exercises        Assessment/Plan    PT Assessment Patient needs continued PT services  PT Diagnosis Generalized weakness   PT Problem List Decreased strength;Decreased activity tolerance;Decreased balance;Decreased mobility;Decreased knowledge of use of DME;Decreased safety awareness;Decreased knowledge of precautions;Cardiopulmonary status limiting activity;Obesity  PT Treatment Interventions DME instruction;Functional mobility training;Therapeutic activities;Therapeutic exercise;Balance training;Patient/family education   PT Goals (Current goals can be found in the Care Plan section) Acute Rehab PT Goals Patient Stated Goal: Pt wants to go back home. PT Goal Formulation: With patient/family Time For Goal Achievement: 11/14/15 Potential to Achieve Goals: Fair    Frequency Min 3X/week   Barriers to discharge        Co-evaluation               End of Session Equipment Utilized During Treatment: Gait belt;Oxygen Activity Tolerance: Patient limited by fatigue Patient left: in chair;with family/visitor present Nurse Communication: Mobility status    Functional Assessment Tool Used: [employer] "6-clicks"  Functional Limitation: Mobility: Walking and moving around Mobility: Walking and Moving Around Current Status [phone]): At least 80 percent but less than 100 percent impaired, limited or restricted Mobility: Walking and Moving Around Goal Status [phone]): At least 60 percent but less than 80 percent impaired, limited or restricted    Time: 1431-1459 PT Time Calculation (min) (ACUTE ONLY): 28 min   Charges:   PT Evaluation $PT Eval Low Complexity: 1 Procedure PT Treatments $Therapeutic Activity: 8-22 mins   PT G Codes:   PT G-Codes **NOT FOR INPATIENT  CLASS** Functional Assessment Tool Used: [employer] "6-clicks"  Functional Limitation: Mobility: Walking and moving around Mobility: Walking and Moving Around Current Status [phone]): At least 80 percent but less than 100 percent impaired, limited or restricted Mobility: Walking and Moving Around Goal Status [phone]): At least 60 percent but less than 80 percent impaired, limited or restricted   Beth Capri Veals, PT, DPT X: [phone]

## 2015-11-08 ENCOUNTER — Inpatient Hospital Stay (HOSPITAL_COMMUNITY): Payer: Medicare Other

## 2015-11-08 DIAGNOSIS — I10 Essential (primary) hypertension: Secondary | ICD-10-CM

## 2015-11-08 LAB — BASIC METABOLIC PANEL
Anion gap: 13 (ref 5–15)
BUN: 40 mg/dL — AB (ref 6–20)
CALCIUM: 8.6 mg/dL — AB (ref 8.9–10.3)
CHLORIDE: 91 mmol/L — AB (ref 101–111)
CO2: 27 mmol/L (ref 22–32)
CREATININE: 2.09 mg/dL — AB (ref 0.44–1.00)
GFR calc Af Amer: 22 mL/min — ABNORMAL LOW (ref >60)
GFR, EST NON AFRICAN AMERICAN: 19 mL/min — AB (ref >60)
Glucose, Bld: 151 mg/dL — ABNORMAL HIGH (ref 65–99)
Potassium: 4.1 mmol/L (ref 3.5–5.1)
Sodium: 131 mmol/L — ABNORMAL LOW (ref 135–145)

## 2015-11-08 LAB — CBC
HCT: 38.5 % (ref 36.0–46.0)
Hemoglobin: 12.8 g/dL (ref 12.0–15.0)
MCH: 31.2 pg (ref 26.0–34.0)
MCHC: 33.2 g/dL (ref 30.0–36.0)
MCV: 93.9 fL (ref 78.0–100.0)
PLATELETS: 264 10*3/uL (ref 150–400)
RBC: 4.1 MIL/uL (ref 3.87–5.11)
RDW: 15.2 % (ref 11.5–15.5)
WBC: 8.2 10*3/uL (ref 4.0–10.5)

## 2015-11-08 LAB — URINE CULTURE: CULTURE: NO GROWTH

## 2015-11-08 MED ORDER — IPRATROPIUM-ALBUTEROL 0.5-2.5 (3) MG/3ML IN SOLN
3.0000 mL | Freq: Two times a day (BID) | RESPIRATORY_TRACT | Status: DC
  Administered 2015-11-08 – 2015-11-11 (×6): 3 mL via RESPIRATORY_TRACT
  Filled 2015-11-08 (×7): qty 3

## 2015-11-08 MED ORDER — FUROSEMIDE 10 MG/ML IJ SOLN
10.0000 mg | Freq: Two times a day (BID) | INTRAMUSCULAR | Status: DC
  Administered 2015-11-08 – 2015-11-09 (×2): 10 mg via INTRAVENOUS
  Filled 2015-11-08 (×2): qty 2

## 2015-11-08 NOTE — Progress Notes (Signed)
PROGRESS NOTE    Daisy Scott  ZOX:[id number] DOB: Nov 25, 1918 DOA: 11/06/2015 PCP: Rudi Heap, MD    Brief Narrative:  Patient is a 82 year old woman with a history of chronic systolic (EF 25-30%) CHF, stage III CKD, PAF on aspirin, HTN, and chronic hypoxic respiratory failure on home oxygen due to asthma/COPD, who presented to the ED on 11/06/2015 for generalized weakness. The patient had generalized weakness for 2 weeks. She had had intermittent nausea and vomiting. She was treated with Scott antibiotic for UTI 3 weeks ago. She had swelling in her legs right greater than left. She saw her PCP, Dr. Oswaldo Done who was concerned about a spot on her right foot. Apparently blood work was done and the patient was instructed to come to the ED for further evaluation of abnormal lab results. In the ED, she was afebrile and intermittently bradycardic and mildly hypertensive. Her lab data were significant for sodium of 127, potassium of 2.9, creatinine is 2.03, BNP of 1178, troponin I of 0.06, WBC of 13.1, and unremarkable urinalysis. CT of the head revealed no acute findings. Right foot x-ray revealed diffuse soft tissue swelling but no fractures. R LE venous ultrasound revealed no DVT. Chest x-ray revealed no active disease. She was admitted for further evaluation and management.  Assessment & Plan:   Principal Problem:   Hyponatremia Active Problems:   Hypertension   Hypothyroid   Asthma, chronic   Severe left ventricular systolic dysfunction   Acute renal failure superimposed on stage 3 chronic kidney disease (HCC)   Edema of right lower extremity   Hypokalemia   Chronic respiratory failure with hypoxia (HCC)   Osteoarthritis of right knee   PAF (paroxysmal atrial fibrillation) (HCC)   Right lower extremity edema with osteoarthritis of the right knee and small effusion. Evaluation in the ED yielded soft tissue swelling on the x-ray of her right foot and no DVT on the venous Doppler study.  On exam, she has a mild effusion and tenderness of her right knee which likely is the culprit for her edema. There does not appear to be cellulitis. -We will treat with Solu-Medrol 1 and then a prednisone taper. Will also add Voltaren gel to her right knee 3 times a day. -We'll order PT eval.  Hyponatremia, likely from hypovolemia. Patient serum sodium was 127 on admission. She had been eating and drinking very little. She did not appear to be volume overloaded on exam with exception of right lower extremity swelling. She appeared to be volume depleted, but apparently received 40 mg of Lasix in the ED for edema in the right leg. -Gentle IV fluids were started with normal saline. Her serum sodium has improved. We'll continue current management.  Hypokalemia. Patient serum potassium was 2.9 on admission. She is on potassium chloride chronically due to chronic Lasix therapy. She was given Lasix in the ED for edema and her right leg and Scott elevated BNP. It has worsened her serum potassium. -We'll increase potassium chloride dosing and monitor.  Acute kidney injury superimposed on stage III-IVchronic kidney disease. Patient's creatinine was 2.03 on admission. Her creatinine 10/2014 was 1.9. It was 2.03 on admission. -She was started on gentle IV fluids. Lasix is being held for now. -Etiology likely prerenal azotemia.  Chronic systolic congestive heart failure. Patient's EF per echo 05/2014 revealed Scott EF of 20-25%. She is treated chronically with Lasix. Apparently, she has Scott allergy to ace inhibitors; would not start Scott ARB due to chronic kidney disease. She is not  on a beta blocker, not sure why. However, given her DO NOT RESUSCITATE status and being 82 years of age, I do not believe there is any utility or benefit in adding a beta blocker at this point. -She does not appear to be decompensated. Lasix was initially held due to volume depletion and hypokalemia. -We'll give her Lasix IV 1 for  subjective shortness of breath. -We'll order a follow-up chest x-ray.  Paroxysmal atrial fibrillation. The patient is treated with aspirin and diltiazem for rate control. She is not on Scott anticoagulant and I would not start one now given her age and debilitation.  Minimal elevation of troponin I. Not consistent with ACS.  Chronic respiratory failure with hypoxia presumed to be secondary to chronic asthma or COPD. The patient was restarted on 3-4 L of nasal cannula oxygen which is her usual. Symbicort was restarted. DuoNeb nebs were started 3 times a day.  There are no wheezes on exam.  Hypothyroidism. The patient was restarted on Synthroid. TSH was within normal limits.  Chronic UTIs. The patient was restarted on chronic trimethoprim treatment. Her urinalysis on admission was unremarkable.  Generalized weakness. Likely multifactorial due to all the above. Vitamin B12 level was assessed and it was actually elevated. Her intake has been poor; she denies pain with swallowing or difficulty swallowing. -Given her age and debility, will order a palliative care consult.   DVT prophylaxis: Lovenox Code Status: DO NOT RESUSCITATE Family Communication: Discussed with daughters on 11/07/15 Disposition Plan: Discharge to home with PT versus short-term skilled nursing facility placement. Family prefers the patient to be discharged to home. Likely discharge in 3 days.   Consultants:   None  Procedures:   None  Antimicrobials:   None    Subjective: Patient says that she just doesn't feel well. She doesn't feel like eating. She denies pain with swallowing or difficulty swallowing. She feels short of breath.  Objective: Filed Vitals:   11/07/15 2234 11/08/15 0656 11/08/15 0833 11/08/15 0837  BP: 137/86 143/88    Pulse: 91     Temp: 97 F (36.1 C) 97.4 F (36.3 C)    TempSrc: Oral Oral    Resp: 20 20    Height:      Weight:      SpO2: 98% 95% 96% 96%    Intake/Output Summary  (Last 24 hours) at 11/08/15 1329 Last data filed at 11/08/15 1201  Gross per 24 hour  Intake   1200 ml  Output    150 ml  Net   1050 ml   Filed Weights   11/06/15 1622 11/06/15 2213  Weight: 88.905 kg (196 lb) 89.767 kg (197 lb 14.4 oz)    Examination:  General exam:  Morbidly obese 82 year old woman Who does not appear to be comfortable.  Respiratory system: Lungs are clear anteriorly, but she appears short of breath. Cardiovascular system: Irregular, irregular. Right leg/foot with 1+ nonpitting edema; trace nonpitting edema left foot. Gastrointestinal system: Abdomen is obese; nondistended, soft and nontender. No organomegaly or masses felt. Normal bowel sounds heard. Central nervous system: Alert and oriented. No focal neurological deficits. Extremities: Multiple varicosities of her lower legs. Mild to moderate effusion of the right knee with moderate tenderness; no appreciable erythema and minimal warmness. Skin: Varicosities and mild ecchymosis right dorsum. Psychiatry: Pleasant affect.     Data Reviewed: I have personally reviewed following labs and imaging studies  CBC:  Recent Labs Lab 11/02/15 1226 11/05/15 1814 11/06/15 1731 11/07/15 0448 11/08/15 1610  WBC 15.4* 11.6* 13.1* 14.2* 8.2  NEUTROABS 12.9* 8.3* 9.8*  --   --   HGB 13.5  --  12.6 13.3 12.8  HCT 40.6 38.6 37.8 40.6 38.5  MCV 94.0 92 93.8 94.0 93.9  PLT 336 321 250 282 264   Basic Metabolic Panel:  Recent Labs Lab 11/05/15 1814 11/06/15 1731 11/06/15 2340 11/07/15 1042 11/08/15 0614  NA 127* 127* 128* 131* 131*  K 4.7 2.9* 3.0* 2.7* 4.1  CL 85* 88* 86* 88* 91*  CO2 GLUCOSE 113* 115* 104* 102* 151*  BUN 34 33* 33* 31* 40*  CREATININE 2.38* 2.03* 2.03* 1.90* 2.09*  CALCIUM 9.1 8.7* 8.6* 8.9 8.6*  MG  --   --   --  1.7  --    GFR: Estimated Creatinine Clearance: 17.1 mL/min (by C-G formula based on Cr of 2.09). Liver Function Tests:  Recent Labs Lab 11/02/15 1226  11/06/15 1731  AST 33 33  ALT 24 35  ALKPHOS 78 72  BILITOT 0.4 0.6  PROT 7.1 6.8  ALBUMIN 3.6 3.5    Recent Labs Lab 11/02/15 1226  LIPASE 18   No results for input(s): AMMONIA in the last 168 hours. Coagulation Profile: No results for input(s): INR, PROTIME in the last 168 hours. Cardiac Enzymes:  Recent Labs Lab 11/02/15 1226 11/06/15 1731  TROPONINI 0.06* 0.06*   BNP (last 3 results) No results for input(s): PROBNP in the last 8760 hours. HbA1C: No results for input(s): HGBA1C in the last 72 hours. CBG: No results for input(s): GLUCAP in the last 168 hours. Lipid Profile: No results for input(s): CHOL, HDL, LDLCALC, TRIG, CHOLHDL, LDLDIRECT in the last 72 hours. Thyroid Function Tests:  Recent Labs  11/06/15 2359  TSH 1.102  FREET4 1.81*   Anemia Panel:  Recent Labs  11/06/15 2359  VITAMINB12 3415*   Sepsis Labs:  Recent Labs Lab 11/02/15 1308 11/06/15 1731  LATICACIDVEN 1.2 1.1    Recent Results (from the past 240 hour(s))  Microscopic Examination     Status: Abnormal   Collection Time: 10/30/15  3:47 PM  Result Value Ref Range Status   WBC, UA 6-10 (A) 0 -  5 /hpf Final   RBC, UA 0-2 0 -  2 /hpf Final   Epithelial Cells (non renal) >10 (A) 0 - 10 /hpf Final   Renal Epithel, UA >10 (A) None seen /hpf Final   Bacteria, UA Few None seen/Few Final  Urine culture     Status: None   Collection Time: 11/06/15  7:00 PM  Result Value Ref Range Status   Specimen Description URINE, CATHETERIZED  Final   Special Requests NONE  Final   Culture NO GROWTH Performed at [facility]   Final   Report Status 11/08/2015 FINAL  Final         Radiology Studies: Dg Chest 2 View  11/06/2015  CLINICAL DATA:  Generalized weakness. Right leg swelling. Congestive heart failure. Atrial fibrillation. COPD. EXAM: CHEST  2 VIEW COMPARISON:  10/24/2015 FINDINGS: Cardiomegaly remains stable. Ectasia and atherosclerotic calcification of thoracic aorta  appears unchanged. Bibasilar pulmonary parenchymal scarring again noted kappa however there is no evidence of pulmonary edema or consolidation. No evidence of pneumothorax or pleural effusion. Pulmonary hyperinflation again noted. IMPRESSION: Stable cardiomegaly and bilateral pulmonary scarring. No active lung disease. Aortic atherosclerosis noted. Electronically Signed   By: Myles Rosenthal M.D.   On: 11/06/2015 18:25   Dg Ankle Complete Right  11/06/2015  CLINICAL DATA:  Right ankle swelling and contusion. EXAM: RIGHT ANKLE - COMPLETE 3+ VIEW COMPARISON:  None. FINDINGS: There is no evidence of fracture, dislocation, or joint effusion. Generalized osteopenia noted, but no focal bone abnormality identified. Diffuse soft tissue swelling noted. Extensive peripheral vascular calcification also seen. IMPRESSION: Diffuse soft tissue swelling.  No evidence of fracture . Electronically Signed   By: Myles Rosenthal M.D.   On: 11/06/2015 18:28   Ct Head Wo Contrast  11/06/2015  CLINICAL DATA:  Slurred speech and weakness for 4 days. EXAM: CT HEAD WITHOUT CONTRAST TECHNIQUE: Contiguous axial images were obtained from the base of the skull through the vertex without intravenous contrast. COMPARISON:  04/02/2014 FINDINGS: There is no evidence of intracranial hemorrhage, brain edema, or other signs of acute infarction. There is no evidence of intracranial mass lesion or mass effect. No abnormal extraaxial fluid collections are identified. Mild diffuse cerebral and cerebellar atrophy and chronic small vessel disease are stable in appearance. No evidence of hydrocephalus. No skull abnormality identified. IMPRESSION: No acute intracranial abnormality. Stable cerebral and cerebellar atrophy, and chronic small vessel disease. Electronically Signed   By: Myles Rosenthal M.D.   On: 11/06/2015 19:25   US Venous Img Lower Unilateral Right  11/06/2015  CLINICAL DATA:  Swelling EXAM: RIGHT LOWER EXTREMITY VENOUS DOPPLER ULTRASOUND TECHNIQUE:  Gray-scale sonography with graded compression, as well as color Doppler and duplex ultrasound were performed to evaluate the lower extremity deep venous systems from the level of the common femoral vein and including the common femoral, femoral, profunda femoral, popliteal and calf veins including the posterior tibial, peroneal and gastrocnemius veins when visible. The superficial great saphenous vein was also interrogated. Spectral Doppler was utilized to evaluate flow at rest and with distal augmentation maneuvers in the common femoral, femoral and popliteal veins. COMPARISON:  None. FINDINGS: Contralateral Common Femoral Vein: Respiratory phasicity is normal and symmetric with the symptomatic side. No evidence of thrombus. Normal compressibility. Common Femoral Vein: No evidence of thrombus. Normal compressibility, respiratory phasicity and response to augmentation. Saphenofemoral Junction: No evidence of thrombus. Normal compressibility and flow on color Doppler imaging. Profunda Femoral Vein: No evidence of thrombus. Normal compressibility and flow on color Doppler imaging. Femoral Vein: No evidence of thrombus. Normal compressibility, respiratory phasicity and response to augmentation. Popliteal Vein: No evidence of thrombus. Normal compressibility, respiratory phasicity and response to augmentation. Calf Veins: Poor visualization of the calf veins and peroneal vein. Superficial Great Saphenous Vein: Poor visualization of the greater saphenous vein. Venous Reflux:  None. Other Findings:  None. IMPRESSION: Evaluation of the calf veins and greater saphenous veins is limited. No DVT seen from the right common femoral vein through the popliteal vein. Electronically Signed   By: Gerome Sam III M.D   On: 11/06/2015 18:48   Dg Foot Complete Right  11/06/2015  CLINICAL DATA:  Right foot swelling and contusion. EXAM: RIGHT FOOT COMPLETE - 3+ VIEW COMPARISON:  None. FINDINGS: There is no evidence of acute  fracture or dislocation. Severe hallux valgus with bony bunion formation noted. Generalized osteopenia. No focal lytic or sclerotic bone lesions. Small calcaneal bone spurs noted. Peripheral vascular calcification also seen. IMPRESSION: No acute findings. Severe hallux valgus and bony bunion. Electronically Signed   By: Myles Rosenthal M.D.   On: 11/06/2015 18:30        Scheduled Meds: . aspirin EC  81 mg Oral Daily  . brimonidine  1 drop Both Eyes Daily  . brinzolamide  1  drop Right Eye BID  . diclofenac sodium  4 g Topical TID  . diltiazem  240 mg Oral Daily  . enoxaparin (LOVENOX) injection  30 mg Subcutaneous Q24H  . famotidine  20 mg Oral BID  . feeding supplement (ENSURE ENLIVE)  237 mL Oral TID BM  . ipratropium-albuterol  3 mL Nebulization BID  . levothyroxine  75 mcg Oral QAC breakfast  . loratadine  10 mg Oral Daily  . mometasone-formoterol  2 puff Inhalation BID  . potassium chloride  30 mEq Oral BID  . predniSONE  30 mg Oral Q breakfast  . senna-docusate  1 tablet Oral BID  . sodium chloride flush  3 mL Intravenous Q12H  . trimethoprim  100 mg Oral Daily   Continuous Infusions: . sodium chloride 50 mL/hr at 11/08/15 1155     LOS: 2 days    Time spent: 35 minutes    Elliot Cousin, MD [facility] Pager [phone]  If 7PM-7AM, please contact night-coverage [email or web address] Password [email or web address] 11/08/2015, 1:29 PM

## 2015-11-09 LAB — BASIC METABOLIC PANEL
ANION GAP: 9 (ref 5–15)
BUN: 45 mg/dL — ABNORMAL HIGH (ref 6–20)
CHLORIDE: 94 mmol/L — AB (ref 101–111)
CO2: 29 mmol/L (ref 22–32)
Calcium: 8.5 mg/dL — ABNORMAL LOW (ref 8.9–10.3)
Creatinine, Ser: 1.9 mg/dL — ABNORMAL HIGH (ref 0.44–1.00)
GFR calc Af Amer: 25 mL/min — ABNORMAL LOW (ref >60)
GFR calc non Af Amer: 21 mL/min — ABNORMAL LOW (ref >60)
GLUCOSE: 102 mg/dL — AB (ref 65–99)
POTASSIUM: 4 mmol/L (ref 3.5–5.1)
Sodium: 132 mmol/L — ABNORMAL LOW (ref 135–145)

## 2015-11-09 MED ORDER — LATANOPROST 0.005 % OP SOLN
1.0000 [drp] | Freq: Every day | OPHTHALMIC | Status: DC
  Administered 2015-11-09 – 2015-11-10 (×2): 1 [drp] via OPHTHALMIC
  Filled 2015-11-09: qty 2.5

## 2015-11-09 MED ORDER — LATANOPROST 0.005 % OP SOLN
OPHTHALMIC | Status: AC
  Filled 2015-11-09: qty 2.5

## 2015-11-09 NOTE — Progress Notes (Signed)
PROGRESS NOTE    Daisy Scott  BMW:[id number] DOB: Jun 08, 1918 DOA: 11/06/2015 PCP: Rudi Heap, MD    Brief Narrative:  Patient is a 82 year old woman with a history of chronic systolic (EF 25-30%) CHF, stage III CKD, PAF on aspirin, HTN, and chronic hypoxic respiratory failure on home oxygen due to asthma/COPD, who presented to the ED on 11/06/2015 for generalized weakness. The patient had generalized weakness for 2 weeks. She had had intermittent nausea and vomiting. She was treated with an antibiotic for UTI 3 weeks ago. She had swelling in her legs right greater than left. She saw her PCP, Dr. Oswaldo Done who was concerned about a spot on her right foot. Apparently blood work was done and the patient was instructed to come to the ED for further evaluation of abnormal lab results. In the ED, she was afebrile and intermittently bradycardic and mildly hypertensive. Her lab data were significant for sodium of 127, potassium of 2.9, creatinine is 2.03, BNP of 1178, troponin I of 0.06, WBC of 13.1, and unremarkable urinalysis. CT of the head revealed no acute findings. Right foot x-ray revealed diffuse soft tissue swelling but no fractures. R LE venous ultrasound revealed no DVT. Chest x-ray revealed no active disease. She was admitted for further evaluation and management.  Assessment & Plan:   Principal Problem:   Hyponatremia Active Problems:   Hypertension   Hypothyroid   Asthma, chronic   Severe left ventricular systolic dysfunction   Acute renal failure superimposed on stage 3 chronic kidney disease (HCC)   Edema of right lower extremity   Hypokalemia   Chronic respiratory failure with hypoxia (HCC)   Osteoarthritis of right knee   PAF (paroxysmal atrial fibrillation) (HCC)   Right lower extremity edema with osteoarthritis of the right knee and small effusion. Evaluation in the ED yielded soft tissue swelling on the x-ray of her right foot and no DVT on the venous Doppler study.  On exam, she has a mild effusion and tenderness of her right knee which likely is the culprit for her edema. There does not appear to be cellulitis. -Treatment was started with Solu-Medrol 1. Prednisone is being given. Voltaren gel has been ordered 3 times a day to the right knee. There appears to be some improvement. -PT evaluation ordered and pending. However, the family prefers keeping the patient at home if possible.  Hyponatremia, likely from hypovolemia. Patient serum sodium was 127 on admission. She had been eating and drinking very little. She did not appear to be volume overloaded on exam with exception of right lower extremity swelling. She appeared to be volume depleted, but apparently received 40 mg of Lasix in the ED for edema in the right leg. -Gentle IV fluids were started with normal saline. Her serum sodium has improved. We'll continue current management.  Hypokalemia. Patient serum potassium was 2.9 on admission. She is on potassium chloride chronically due to chronic Lasix therapy. She was given Lasix in the ED for edema and her right leg and an elevated BNP. It has worsened her serum potassium. More potassium was given and her serum potassium has improved.  Acute kidney injury superimposed on stage III-IVchronic kidney disease. Patient's creatinine was 2.03 on admission. Her creatinine 10/2014 was 1.9. It was 2.03 on admission. -She was started on gentle IV fluids. Scheduled Lasix is being held for now -Etiology likely prerenal azotemia.  Chronic systolic congestive heart failure. Patient's EF per echo 05/2014 revealed an EF of 20-25%. She is treated chronically with Lasix.  Apparently, she has an allergy to ace inhibitors; would not start an ARB due to chronic kidney disease. She is not on a beta blocker, not sure why. However, given her DO NOT RESUSCITATE status and being 82 years of age, I do not believe there is any utility or benefit in adding a beta blocker at this  point. -She does not appear to be decompensated. Lasix was initially held due to volume depletion and hypokalemia. -She was given 20 mg IV Lasix empirically for shortness of breath. The chest x-ray did not show revealed CHF. We'll continue to monitor.  Paroxysmal atrial fibrillation. The patient is treated with aspirin and diltiazem for rate control. She is not on an anticoagulant and I would not start one now given her age and debilitation.  Minimal elevation of troponin I. Not consistent with ACS.  Chronic respiratory failure with hypoxia presumed to be secondary to chronic asthma or COPD. The patient was restarted on 3-4 L of nasal cannula oxygen which is her usual. Symbicort was restarted. DuoNeb nebs were started 3 times a day.  There are no wheezes on exam.  Hypothyroidism. The patient was restarted on Synthroid. TSH was within normal limits.  Chronic UTIs. The patient was restarted on chronic trimethoprim treatment. Her urinalysis on admission was unremarkable.  Generalized weakness. Consideration for hospice and palliative care. Likely multifactorial due to all the above. Vitamin B12 level was assessed and it was actually elevated. Her intake has been poor; she denies pain with swallowing or difficulty swallowing. -Patient appears evolving into progressive senile decline; coupled with chronic comorbid conditions and obesity. I discussed palliative care consult with her son who was in agreement. He stated that his sister was the primary spokesperson and caretaker. We'll therefore order palliative care consult.   DVT prophylaxis: Lovenox Code Status: DO NOT RESUSCITATE Family Communication: Discussed with daughters on 11/07/15 Disposition Plan: Discharge to home with PT versus short-term skilled nursing facility placement. Family prefers the patient to be discharged to home with 24-hour care. Likely discharge in 1-2 days.   Consultants:   Palliative care pending  Procedures:    None  Antimicrobials:   None    Subjective: Patient continues to say that she doesn't feel well, but she cannot point specifically to why. Her son is in the room; questions answered.  Objective: Vitals:   11/08/15 2015 11/09/15 0819 11/09/15 1000 11/09/15 1700  BP:   (!) 118/50 123/62  Pulse:    65  Resp:    18  Temp:    98.1 F (36.7 C)  TempSrc:    Oral  SpO2: 96% 96%  96%  Weight:      Height:        Intake/Output Summary (Last 24 hours) at 11/09/15 1745 Last data filed at 11/09/15 1713  Gross per 24 hour  Intake             1360 ml  Output              850 ml  Net              510 ml   Filed Weights   11/06/15 1622 11/06/15 2213  Weight: 88.9 kg (196 lb) 89.8 kg (197 lb 14.4 oz)    Examination:  General exam:  Morbidly obese 82 year old woman who Is sleeping but arousable.  Respiratory system: Lungs are clear anteriorly, but she appears short of breath. Cardiovascular system: Irregular, irregular. Right leg/foot with 1+ nonpitting edema; trace nonpitting edema  left foot. Gastrointestinal system: Abdomen is obese; nondistended, soft and nontender. No organomegaly or masses felt. Normal bowel sounds heard. Central nervous system: Alert and oriented. No focal neurological deficits. Extremities: Multiple varicosities of her lower legs. Decreasing effusion of the right knee with moderate tenderness; no appreciable erythema and minimal warmness. Skin: Varicosities and mild ecchymosis right dorsum. Psychiatry: Pleasant affect.     Data Reviewed: I have personally reviewed following labs and imaging studies  CBC:  Recent Labs Lab 11/05/15 1814 11/06/15 1731 11/07/15 0448 11/08/15 0614  WBC 11.6* 13.1* 14.2* 8.2  NEUTROABS 8.3* 9.8*  --   --   HGB  --  12.6 13.3 12.8  HCT 38.6 37.8 40.6 38.5  MCV 92 93.8 94.0 93.9  PLT 321 250 282 264   Basic Metabolic Panel:  Recent Labs Lab 11/06/15 1731 11/06/15 2340 11/07/15 1042 11/08/15 0614 11/09/15 0755   NA 127* 128* 131* 131* 132*  K 2.9* 3.0* 2.7* 4.1 4.0  CL 88* 86* 88* 91* 94*  CO2 28 29 29 27 29   GLUCOSE 115* 104* 102* 151* 102*  BUN 33* 33* 31* 40* 45*  CREATININE 2.03* 2.03* 1.90* 2.09* 1.90*  CALCIUM 8.7* 8.6* 8.9 8.6* 8.5*  MG  --   --  1.7  --   --    GFR: Estimated Creatinine Clearance: 18.8 mL/min (by C-G formula based on SCr of 1.9 mg/dL). Liver Function Tests:  Recent Labs Lab 11/06/15 1731  AST 33  ALT 35  ALKPHOS 72  BILITOT 0.6  PROT 6.8  ALBUMIN 3.5   No results for input(s): LIPASE, AMYLASE in the last 168 hours. No results for input(s): AMMONIA in the last 168 hours. Coagulation Profile: No results for input(s): INR, PROTIME in the last 168 hours. Cardiac Enzymes:  Recent Labs Lab 11/06/15 1731  TROPONINI 0.06*   BNP (last 3 results) No results for input(s): PROBNP in the last 8760 hours. HbA1C: No results for input(s): HGBA1C in the last 72 hours. CBG: No results for input(s): GLUCAP in the last 168 hours. Lipid Profile: No results for input(s): CHOL, HDL, LDLCALC, TRIG, CHOLHDL, LDLDIRECT in the last 72 hours. Thyroid Function Tests:  Recent Labs  11/06/15 2359  TSH 1.102  FREET4 1.81*   Anemia Panel:  Recent Labs  11/06/15 2359  VITAMINB12 3,415*   Sepsis Labs:  Recent Labs Lab 11/06/15 1731  LATICACIDVEN 1.1    Recent Results (from the past 240 hour(s))  Urine culture     Status: None   Collection Time: 11/06/15  7:00 PM  Result Value Ref Range Status   Specimen Description URINE, CATHETERIZED  Final   Special Requests NONE  Final   Culture NO GROWTH Performed at [facility]   Final   Report Status 11/08/2015 FINAL  Final         Radiology Studies: Dg Chest Port 1 View  Result Date: 11/08/2015 CLINICAL DATA:  Shortness of breath. EXAM: PORTABLE CHEST 1 VIEW COMPARISON:  Chest x-rays from October 24, 2015 and November 06, 2015 FINDINGS: No pneumothorax. The cardiomediastinal silhouette is stable with  cardiomegaly and prominent bilateral pulmonary arteries. Mild increased opacity in left lung base may represent atelectasis versus early infiltrate. Recommend attention on follow-up. No other acute abnormalities. IMPRESSION: 1. Increasing opacity in left lung base may represent atelectasis versus early infiltrate. Recommend clinical correlation and follow-up to resolution. Electronically Signed   By: Gerome Sam III M.D   On: 11/08/2015 16:34  Scheduled Meds: . aspirin EC  81 mg Oral Daily  . brimonidine  1 drop Both Eyes Daily  . brinzolamide  1 drop Right Eye BID  . diclofenac sodium  4 g Topical TID  . diltiazem  240 mg Oral Daily  . enoxaparin (LOVENOX) injection  30 mg Subcutaneous Q24H  . famotidine  20 mg Oral BID  . feeding supplement (ENSURE ENLIVE)  237 mL Oral TID BM  . furosemide  10 mg Intravenous BID  . ipratropium-albuterol  3 mL Nebulization BID  . levothyroxine  75 mcg Oral QAC breakfast  . loratadine  10 mg Oral Daily  . mometasone-formoterol  2 puff Inhalation BID  . potassium chloride  30 mEq Oral BID  . predniSONE  30 mg Oral Q breakfast  . senna-docusate  1 tablet Oral BID  . sodium chloride flush  3 mL Intravenous Q12H  . trimethoprim  100 mg Oral Daily   Continuous Infusions: . sodium chloride 50 mL/hr at 11/09/15 1400     LOS: 3 days    Time spent: 35 minutes    Elliot Cousin, MD [facility] Pager [phone]  If 7PM-7AM, please contact night-coverage [email or web address] Password [facility] 11/09/2015, 5:45 PM

## 2015-11-09 NOTE — Care Management Important Message (Signed)
Important Message  Patient Details  Name: Daisy Scott MRN: [id number] Date of Birth: [birth year]   Medicare Important Message Given:  Yes    Fuller Plan, RN 11/09/2015, 5:08 PM

## 2015-11-10 ENCOUNTER — Encounter (HOSPITAL_COMMUNITY): Payer: Self-pay | Admitting: Primary Care

## 2015-11-10 DIAGNOSIS — Z7189 Other specified counseling: Secondary | ICD-10-CM

## 2015-11-10 DIAGNOSIS — Z515 Encounter for palliative care: Secondary | ICD-10-CM

## 2015-11-10 LAB — BASIC METABOLIC PANEL
Anion gap: 10 (ref 5–15)
BUN: 42 mg/dL — AB (ref 6–20)
CALCIUM: 8.9 mg/dL (ref 8.9–10.3)
CHLORIDE: 98 mmol/L — AB (ref 101–111)
CO2: 27 mmol/L (ref 22–32)
CREATININE: 1.47 mg/dL — AB (ref 0.44–1.00)
GFR calc non Af Amer: 29 mL/min — ABNORMAL LOW (ref >60)
GFR, EST AFRICAN AMERICAN: 34 mL/min — AB (ref >60)
Glucose, Bld: 100 mg/dL — ABNORMAL HIGH (ref 65–99)
Potassium: 4.7 mmol/L (ref 3.5–5.1)
Sodium: 135 mmol/L (ref 135–145)

## 2015-11-10 MED ORDER — FUROSEMIDE 20 MG PO TABS: 20.0000 mg | ORAL_TABLET | Freq: Every day | ORAL | Status: DC

## 2015-11-10 MED ORDER — PREDNISONE 10 MG PO TABS
10.0000 mg | ORAL_TABLET | Freq: Every day | ORAL | Status: DC
  Administered 2015-11-11: 10 mg via ORAL
  Filled 2015-11-10: qty 1

## 2015-11-10 MED ORDER — FUROSEMIDE 20 MG PO TABS
10.0000 mg | ORAL_TABLET | Freq: Every day | ORAL | Status: DC
  Administered 2015-11-10 – 2015-11-11 (×2): 10 mg via ORAL
  Filled 2015-11-10 (×2): qty 1

## 2015-11-10 NOTE — Consult Note (Signed)
Consultation Note Date: 11/10/2015   Patient Name: Daisy Scott  DOB: [birth year]  MRN: [id number]  Age / Sex: 82 y.o., female  PCP: Daisy Penna, MD Referring Physician: Elliot Cousin, MD  Reason for Consultation: Disposition, Establishing goals of care, Hospice Evaluation and Psychosocial/spiritual support  HPI/Patient Profile: 82 y.o. female  with past medical history of CKD stage 3, asthma, HTN, CHF, COPD, A fib,  admitted on 11/06/2015 with CHF exacerbation c BNP 1100's. .   Clinical Assessment and Goals of Care: Mrs. Daisy Scott is resting quietly in bed. No family at bedside. I returned later for a scheduled meeting with son and daughter, Daisy Scott and Daisy Scott. We talk about Daisy Scott's chronic illness. We also talk about her current acute illness. They tell a story that she almost died in 03/06/16due to pneumonia. They talk about the decline that they have seen since. They also share decline over the last 3 months, they agree that she is eating less, sleeping more. I share the chronic illness trajectory.   Son and daughter Daisy Scott and Daisy Scott, share their mother's story. They tell me that she has told them multiple times that she is "ready to go". They state that they feel that she has peace. We talk about the benefits of hospice at home. They share that they feel this is good option for them. They are considering which provider to use. I will follow-up with them.   Health care Power of atty.  HCPOA son, Daisy Scott "Daisy Scott" Daisy Scott   SUMMARY OF RECOMMENDATIONS   return to their own home with 24 hour care, considering in-home hospice provider  Code Status/Advance Care Planning:  DNR  Symptom Management:   Per hospitalist   Palliative Prophylaxis:   Frequent Pain Assessment, Palliative Wound Care and Turn Reposition  Additional Recommendations (Limitations, Scope,  Preferences):  Considering reducing medication burden, considering do not re-hospitalize, considering choice for in-home hospice provider  Psycho-social/Spiritual:   Desire for further Chaplaincy support:no  Additional Recommendations: Caregiving  Support/Resources, Education on Hospice and Grief/Bereavement Support  Prognosis:   < 6 months, possibly less, based on chronic illness burden, functional decline over the last 3 months, families desire to focus on comfort  Discharge Planning: Families desire is for Daisy Scott to return to her own home with 24/7 care. They are considering an in-home hospice provider.      Primary Diagnoses: Present on Admission: . Hyponatremia . Asthma, chronic . Acute renal failure superimposed on stage 3 chronic kidney disease (HCC) . Hypertension . Hypothyroid . Severe left ventricular systolic dysfunction . Edema of right lower extremity . Hypokalemia . Chronic respiratory failure with hypoxia (HCC) . Osteoarthritis of right knee . PAF (paroxysmal atrial fibrillation) (HCC)   I have reviewed the medical record, interviewed the patient and family, and examined the patient. The following aspects are pertinent.  Past Medical History:  Diagnosis Date  . Ascending aorta dilation (HCC)    4.2 cm 2011   . Asthma   . Atrial fibrillation (HCC)  Documented February 2016 - question paroxysmal   . CHF (congestive heart failure) (HCC)   . Chronic respiratory failure with hypoxia (HCC)   . CKD (chronic kidney disease) stage 3, GFR 30-59 ml/min   . COPD (chronic obstructive pulmonary disease) (HCC)   . Essential hypertension   . Gout   . Hiatal hernia   . Hypothyroidism   . On home O2    3-4L  . Secondary cardiomyopathy (HCC)   . Severe malnutrition (HCC)    Social History   Social History  . Marital status: Widowed    Spouse name: N/A  . Number of children: N/A  . Years of education: N/A   Social History Main Topics  . Smoking  status: Never Smoker  . Smokeless tobacco: Never Used  . Alcohol use No  . Drug use: No  . Sexual activity: Not Asked   Other Topics Concern  . None   Social History Narrative  . None   Family History  Problem Relation Age of Onset  . Heart attack Father    Scheduled Meds: . aspirin EC  81 mg Oral Daily  . brimonidine  1 drop Both Eyes Daily  . brinzolamide  1 drop Right Eye BID  . diclofenac sodium  4 g Topical TID  . diltiazem  240 mg Oral Daily  . enoxaparin (LOVENOX) injection  30 mg Subcutaneous Q24H  . famotidine  20 mg Oral BID  . feeding supplement (ENSURE ENLIVE)  237 mL Oral TID BM  . ipratropium-albuterol  3 mL Nebulization BID  . latanoprost  1 drop Right Eye QHS  . levothyroxine  75 mcg Oral QAC breakfast  . loratadine  10 mg Oral Daily  . mometasone-formoterol  2 puff Inhalation BID  . predniSONE  30 mg Oral Q breakfast  . senna-docusate  1 tablet Oral BID  . sodium chloride flush  3 mL Intravenous Q12H  . trimethoprim  100 mg Oral Daily   Continuous Infusions: . sodium chloride 50 mL/hr at 11/09/15 1400   PRN Meds:.acetaminophen **OR** acetaminophen, albuterol, LORazepam, ondansetron **OR** ondansetron (ZOFRAN) IV Medications Prior to Admission:  Prior to Admission medications   Medication Sig Start Date End Date Taking? Authorizing Provider  allopurinol (ZYLOPRIM) 100 MG tablet TAKE TWO TABLETS BY MOUTH DAILY 10/15/15  Yes Daisy Penna, MD  aspirin EC 81 MG tablet Take 81 mg by mouth daily.   Yes Historical Provider, MD  AZOPT 1 % ophthalmic suspension INSTILL ONE DROP IN RIGHT EYE TWICE DAILY AS DIRECTED 10/17/15  Yes Daisy Penna, MD  brimonidine (ALPHAGAN) 0.2 % ophthalmic solution Place 1 drop into both eyes daily.   Yes Historical Provider, MD  Cranberry 500 MG CAPS Take 1 capsule by mouth daily.   Yes Historical Provider, MD  diltiazem (CARDIZEM CD) 240 MG 24 hr capsule TAKE ONE (1) CAPSULE EACH DAY 11/04/15  Yes Daisy Penna, MD  famotidine  (PEPCID) 20 MG tablet Take 1 tablet (20 mg total) by mouth 2 (two) times daily. 11/02/15  Yes Jacalyn Lefevre, MD  feeding supplement, ENSURE COMPLETE, (ENSURE COMPLETE) LIQD Take 237 mLs by mouth 3 (three) times daily between meals. 05/29/2014  Yes Standley Brooking, MD  furosemide (LASIX) 20 MG tablet TAKE ONE (1) TABLET EACH DAY 05/07/15  Yes Daisy Penna, MD  ipratropium (ATROVENT) 0.02 % nebulizer solution NEBULIZE 1 VIAL EVERY 6 HOURS 07/31/15  Yes Daisy Penna, MD  levalbuterol Pauline Aus) 0.63 MG/3ML nebulizer solution NEBULIZE 1 VIAL  EVERY 6 HOURS 10/24/15  Yes Daisy Penna, MD  levothyroxine (SYNTHROID, LEVOTHROID) 75 MCG tablet TAKE ONE (1) TABLET EACH DAY 11/04/15  Yes Daisy Penna, MD  loratadine (CLARITIN) 10 MG tablet Take 10 mg by mouth daily.   Yes Historical Provider, MD  LORazepam (ATIVAN) 0.5 MG tablet TAKE ONE TABLET TWICE DAILY AS NEEDED 11/04/15  Yes Daisy Penna, MD  Multiple Vitamin (MULTIVITAMIN WITH MINERALS) TABS tablet Take 1 tablet by mouth daily.   Yes Historical Provider, MD  pantoprazole (PROTONIX) 40 MG tablet TAKE ONE (1) TABLET EACH DAY 07/04/15  Yes Frederica Kuster, MD  potassium chloride SA (K-DUR,KLOR-CON) 20 MEQ tablet TAKE ONE (1) TABLET EACH DAY 07/04/15  Yes Frederica Kuster, MD  sertraline (ZOLOFT) 50 MG tablet TAKE 1/2 TABLET DAILY 10/15/15  Yes Daisy Penna, MD  SYMBICORT 160-4.5 MCG/ACT inhaler USE 2 PUFFS TWICE DAILY 10/31/15  Yes Daisy Penna, MD  TRAVATAN Z 0.004 % SOLN ophthalmic solution PLACE 1 DROP IN RIGHT EYE AT BEDTIME 10/17/15  Yes Daisy Penna, MD  trimethoprim (TRIMPEX) 100 MG tablet Take 100 mg by mouth daily.  12/12/14  Yes Historical Provider, MD   Allergies  Allergen Reactions  . Lisinopril Other (See Comments)    Side of face dropped.  Looked like patient had had a stroke.  . Sulfa Antibiotics     Possible reaction- vomited And angio edema  . Ciprofloxacin Nausea Only  . Macrobid [Nitrofurantoin Macrocrystal] Nausea Only    Review of Systems  Unable to perform ROS: Age    Physical Exam  Constitutional:  Chronically ill appearing  HENT:  Head: Normocephalic and atraumatic.  Cardiovascular: Normal rate.   Pulmonary/Chest: Effort normal. No respiratory distress.  Abdominal: Soft.  Soft, rounded.   Skin: Skin is warm and dry.  R leg swelling greater than left.   Nursing note and vitals reviewed.   Vital Signs: BP 118/75   Pulse 79   Temp 98.3 F (36.8 C)   Resp 16   Ht 5\' 3"  (1.6 m)   Wt 89.8 kg (197 lb 14.4 oz)   SpO2 100%   BMI 35.06 kg/m  Pain Assessment: No/denies pain POSS *See Group Information*: 1-Acceptable,Awake and alert Pain Score: 0-No pain   SpO2: SpO2: 100 % O2 Device:SpO2: 100 % O2 Flow Rate: .O2 Flow Rate (L/min): 8 L/min  IO: Intake/output summary:  Intake/Output Summary (Last 24 hours) at 11/10/15 1419 Last data filed at 11/10/15 1300  Gross per 24 hour  Intake             1510 ml  Output              950 ml  Net              560 ml    LBM: Last BM Date: 11/05/15 Baseline Weight: Weight: 88.9 kg (196 lb) Most recent weight: Weight: 89.8 kg (197 lb 14.4 oz)     Palliative Assessment/Data:   Flowsheet Rows   Flowsheet Row Most Recent Value  Intake Tab  Referral Department  Hospitalist  Unit at Time of Referral  Med/Surg Unit  Palliative Care Primary Diagnosis  Cardiac  Date Notified  11/08/15  Palliative Care Type  New Palliative care  Reason for referral  Clarify Goals of Care  Date of Admission  11/06/15  Date first seen by Palliative Care  11/10/15  # of days Palliative referral response time  2 Day(s)  # of days  IP prior to Palliative referral  2  Clinical Assessment  Palliative Performance Scale Score  30%  Pain Max last 24 hours  Not able to report  Pain Min Last 24 hours  Not able to report  Dyspnea Max Last 24 Hours  Not able to report  Dyspnea Min Last 24 hours  Not able to report  Psychosocial & Spiritual Assessment  Palliative Care  Outcomes  Patient/Family meeting held?  Yes  Who was at the meeting?  son and daughter, Calie, Buttrey  Palliative Care Outcomes  Clarified goals of care, Provided psychosocial or spiritual support  Patient/Family wishes: Interventions discontinued/not started   Mechanical Ventilation  Palliative Care follow-up planned  -- [follow up while at [facility]]      Time In: 1600 Time Out: 1700 Time Total: 60 minutes Greater than 50%  of this time was spent counseling and coordinating care related to the above assessment and plan.  Signed by: Katheran Awe, NP   Please contact Palliative Medicine Team phone at [phone] for questions and concerns.  For individual provider: See Loretha Stapler

## 2015-11-10 NOTE — Progress Notes (Signed)
PROGRESS NOTE    Daisy Scott  ZMO:[id number] DOB: 12/25/1918 DOA: 11/06/2015 PCP: Rudi Heap, MD    Brief Narrative:  Patient is a 82 year old woman with a history of chronic systolic (EF 25-30%) CHF, stage III CKD, PAF on aspirin, HTN, and chronic hypoxic respiratory failure on home oxygen due to asthma/COPD, who presented to the ED on 11/06/2015 for generalized weakness. The patient had generalized weakness for 2 weeks. She had had intermittent nausea and vomiting. She was treated with an antibiotic for UTI 3 weeks ago. She had swelling in her legs right greater than left. She saw her PCP, Dr. Oswaldo Done who was concerned about a spot on her right foot. Apparently blood work was done and the patient was instructed to come to the ED for further evaluation of abnormal lab results. In the ED, she was afebrile and intermittently bradycardic and mildly hypertensive. Her lab data were significant for sodium of 127, potassium of 2.9, creatinine is 2.03, BNP of 1178, troponin I of 0.06, WBC of 13.1, and unremarkable urinalysis. CT of the head revealed no acute findings. Right foot x-ray revealed diffuse soft tissue swelling but no fractures. R LE venous ultrasound revealed no DVT. Chest x-ray revealed no active disease. She was admitted for further evaluation and management.  Assessment & Plan:   Principal Problem:   Hyponatremia Active Problems:   Hypertension   Hypothyroid   Asthma, chronic   Severe left ventricular systolic dysfunction   Acute renal failure superimposed on stage 3 chronic kidney disease (HCC)   Edema of right lower extremity   Hypokalemia   Chronic respiratory failure with hypoxia (HCC)   Osteoarthritis of right knee   PAF (paroxysmal atrial fibrillation) (HCC)   Right lower extremity edema with osteoarthritis of the right knee and small effusion. Evaluation in the ED yielded soft tissue swelling on the x-ray of her right foot and no DVT on the venous Doppler study.  On exam, she has a mild effusion and tenderness of her right knee which likely is the culprit for her edema. There does not appear to be cellulitis. -Treatment was started with Solu-Medrol 1. Prednisone is being given. Voltaren gel has been ordered 3 times a day to the right knee. There appears to be some improvement. -PT evaluation ordered and PT recommended home health PT. Family prefers keeping the patient at home/  Hyponatremia, likely from hypovolemia. Patient serum sodium was 127 on admission. She had been eating and drinking very little. She did not appear to be volume overloaded on exam with exception of right lower extremity swelling. She appeared to be volume depleted, but apparently received 40 mg of Lasix in the ED for edema in the right leg. -Gentle IV fluids were started with normal saline. Her serum sodium has improved. We'll continue current management.  Hypokalemia. Patient serum potassium was 2.9 on admission. She is on potassium chloride chronically due to chronic Lasix therapy. She was given Lasix in the ED for edema and her right leg and an elevated BNP. It has worsened her serum potassium. More potassium was given and her serum potassium has improved.  Acute kidney injury superimposed on stage III-IVchronic kidney disease. Patient's creatinine was 2.03 on admission. Her creatinine 10/2014 was 1.9. It was 2.03 on admission. -She was started on gentle IV fluids. Scheduled Lasix was held initially. Her creatinine has improved to 1.47. -Etiology likely prerenal azotemia.  Chronic systolic congestive heart failure. Patient's EF per echo 05/2014 revealed an EF of 20-25%. She is  treated chronically with Lasix. Apparently, she has an allergy to ace inhibitors; would not start an ARB due to chronic kidney disease. She is not on a beta blocker, not sure why. However, given her DO NOT RESUSCITATE status and being 82 years of age, I do not believe there is any utility or benefit in adding  a beta blocker at this point. -She does not appear to be decompensated. Lasix was initially held due to volume depletion and hypokalemia. -She was given 20 mg IV Lasix empirically for shortness of breath. The chest x-ray did not show revealed CHF.  -We'll restart Lasix at half the dose due to mild chest congestion, but doubt decompensated heart failure.  Paroxysmal atrial fibrillation. The patient is treated with aspirin and diltiazem for rate control. She is not on an anticoagulant and I would not start one now given her age and debilitation.  Minimal elevation of troponin I. Not consistent with ACS.  Chronic respiratory failure with hypoxia presumed to be secondary to chronic asthma or COPD. The patient was restarted on 3-4 L of nasal cannula oxygen which is her usual. Symbicort was restarted. DuoNeb nebs were started 3 times a day.  There are no wheezes on exam.  Hypothyroidism. The patient was restarted on Synthroid. TSH was within normal limits.  Chronic UTIs. The patient was restarted on chronic trimethoprim treatment. Her urinalysis on admission was unremarkable.  Generalized weakness. Consideration for hospice and palliative care. Likely multifactorial due to all the above. Vitamin B12 level was assessed and it was actually elevated. Her intake has been poor; she denies pain with swallowing or difficulty swallowing. -Patient appears to be evolving into progressive senile decline; coupled with chronic comorbid conditions and obesity. I discussed palliative care consult with her son who was in agreement.  -Palliative care consult pending.   DVT prophylaxis: Lovenox Code Status: DO NOT RESUSCITATE Family Communication: Discussed with daughters on 11/07/15 Disposition Plan: Discharge to home with PT versus short-term skilled nursing facility placement. Family prefers the patient to be discharged to home with 24-hour care. Likely discharge in 1-2 days.   Consultants:   Palliative  care pending  Procedures:   None  Antimicrobials:   None    Subjective: Patient is sleeping but arousable enough to did not note to pain or shortness of breath.  Objective: Vitals:   11/09/15 2000 11/10/15 0437 11/10/15 0829 11/10/15 0836  BP:  (!) 155/72    Pulse:  81    Resp:  16    Temp:  98.5 F (36.9 C)    TempSrc:  Oral    SpO2: 96%  95% 100%  Weight:      Height:        Intake/Output Summary (Last 24 hours) at 11/10/15 1309 Last data filed at 11/10/15 0900  Gross per 24 hour  Intake             1670 ml  Output              950 ml  Net              720 ml   Filed Weights   11/06/15 1622 11/06/15 2213  Weight: 88.9 kg (196 lb) 89.8 kg (197 lb 14.4 oz)    Examination:  General exam:  Morbidly obese 82 year old woman who is sleeping but arousable.  Respiratory system: Lungs with occasional crackles Cardiovascular system: Irregular, irregular. Right leg/foot with 1+ nonpitting edema; trace nonpitting edema left foot. Gastrointestinal system: Abdomen is  obese; nondistended, soft and nontender. No organomegaly or masses felt. Normal bowel sounds heard. Central nervous system: Alert and oriented. No focal neurological deficits. Extremities: Multiple varicosities of her lower legs. Decreasing effusion of the right knee with moderate tenderness; no appreciable erythema and minimal warmness. Skin: Varicosities and mild ecchymosis right dorsum. Psychiatry: Pleasant affect.     Data Reviewed: I have personally reviewed following labs and imaging studies  CBC:  Recent Labs Lab 11/05/15 1814 11/06/15 1731 11/07/15 0448 11/08/15 0614  WBC 11.6* 13.1* 14.2* 8.2  NEUTROABS 8.3* 9.8*  --   --   HGB  --  12.6 13.3 12.8  HCT 38.6 37.8 40.6 38.5  MCV 92 93.8 94.0 93.9  PLT 321 250 282 264   Basic Metabolic Panel:  Recent Labs Lab 11/06/15 2340 11/07/15 1042 11/08/15 0614 11/09/15 0755 11/10/15 0536  NA 128* 131* 131* 132* 135  K 3.0* 2.7* 4.1 4.0 4.7    CL 86* 88* 91* 94* 98*  CO2 29 29 27 29 27   GLUCOSE 104* 102* 151* 102* 100*  BUN 33* 31* 40* 45* 42*  CREATININE 2.03* 1.90* 2.09* 1.90* 1.47*  CALCIUM 8.6* 8.9 8.6* 8.5* 8.9  MG  --  1.7  --   --   --    GFR: Estimated Creatinine Clearance: 24.4 mL/min (by C-G formula based on SCr of 1.47 mg/dL). Liver Function Tests:  Recent Labs Lab 11/06/15 1731  AST 33  ALT 35  ALKPHOS 72  BILITOT 0.6  PROT 6.8  ALBUMIN 3.5   No results for input(s): LIPASE, AMYLASE in the last 168 hours. No results for input(s): AMMONIA in the last 168 hours. Coagulation Profile: No results for input(s): INR, PROTIME in the last 168 hours. Cardiac Enzymes:  Recent Labs Lab 11/06/15 1731  TROPONINI 0.06*   BNP (last 3 results) No results for input(s): PROBNP in the last 8760 hours. HbA1C: No results for input(s): HGBA1C in the last 72 hours. CBG: No results for input(s): GLUCAP in the last 168 hours. Lipid Profile: No results for input(s): CHOL, HDL, LDLCALC, TRIG, CHOLHDL, LDLDIRECT in the last 72 hours. Thyroid Function Tests: No results for input(s): TSH, T4TOTAL, FREET4, T3FREE, THYROIDAB in the last 72 hours. Anemia Panel: No results for input(s): VITAMINB12, FOLATE, FERRITIN, TIBC, IRON, RETICCTPCT in the last 72 hours. Sepsis Labs:  Recent Labs Lab 11/06/15 1731  LATICACIDVEN 1.1    Recent Results (from the past 240 hour(s))  Urine culture     Status: None   Collection Time: 11/06/15  7:00 PM  Result Value Ref Range Status   Specimen Description URINE, CATHETERIZED  Final   Special Requests NONE  Final   Culture NO GROWTH Performed at [facility]   Final   Report Status 11/08/2015 FINAL  Final         Radiology Studies: Dg Chest Port 1 View  Result Date: 11/08/2015 CLINICAL DATA:  Shortness of breath. EXAM: PORTABLE CHEST 1 VIEW COMPARISON:  Chest x-rays from October 24, 2015 and November 06, 2015 FINDINGS: No pneumothorax. The cardiomediastinal silhouette is  stable with cardiomegaly and prominent bilateral pulmonary arteries. Mild increased opacity in left lung base may represent atelectasis versus early infiltrate. Recommend attention on follow-up. No other acute abnormalities. IMPRESSION: 1. Increasing opacity in left lung base may represent atelectasis versus early infiltrate. Recommend clinical correlation and follow-up to resolution. Electronically Signed   By: Gerome Sam III M.D   On: 11/08/2015 16:34  Scheduled Meds: . aspirin EC  81 mg Oral Daily  . brimonidine  1 drop Both Eyes Daily  . brinzolamide  1 drop Right Eye BID  . diclofenac sodium  4 g Topical TID  . diltiazem  240 mg Oral Daily  . enoxaparin (LOVENOX) injection  30 mg Subcutaneous Q24H  . famotidine  20 mg Oral BID  . feeding supplement (ENSURE ENLIVE)  237 mL Oral TID BM  . ipratropium-albuterol  3 mL Nebulization BID  . latanoprost  1 drop Right Eye QHS  . levothyroxine  75 mcg Oral QAC breakfast  . loratadine  10 mg Oral Daily  . mometasone-formoterol  2 puff Inhalation BID  . predniSONE  30 mg Oral Q breakfast  . senna-docusate  1 tablet Oral BID  . sodium chloride flush  3 mL Intravenous Q12H  . trimethoprim  100 mg Oral Daily   Continuous Infusions: . sodium chloride 50 mL/hr at 11/09/15 1400     LOS: 4 days    Time spent: 20 minutes    Elliot Cousin, MD [facility] Pager [phone]  If 7PM-7AM, please contact night-coverage [email or web address] Password [email or web address] 11/10/2015, 1:09 PM

## 2015-11-11 ENCOUNTER — Telehealth: Payer: Self-pay | Admitting: *Deleted

## 2015-11-11 MED ORDER — DICLOFENAC SODIUM 1 % TD GEL: 4.0000 g | Freq: Three times a day (TID) | TRANSDERMAL | 1 refills | Status: AC | PRN

## 2015-11-11 NOTE — Telephone Encounter (Signed)
Hospice is calling and wanting to know if you will act a primary care for patient under hospice care in the home. Please review and send back to me and I will contact Hospice.

## 2015-11-11 NOTE — Progress Notes (Signed)
Patient's family states understanding of discharge instructions 

## 2015-11-11 NOTE — Telephone Encounter (Signed)
We'll, but if I'm not available someone else in the practice will also be available

## 2015-11-11 NOTE — Progress Notes (Signed)
Daily Progress Note   Patient Name: Daisy Scott       Date: 11/11/2015 DOB: 05-17-1918  Age: 82 y.o. MRN#: [id number] Attending Physician: Elliot Cousin, MD Primary Care Physician: Rudi Heap, MD Admit Date: 11/06/2015  Reason for Consultation/Follow-up: Disposition, Establishing goals of care, Hospice Evaluation and Psychosocial/spiritual support  Subjective: Mrs. Schelle is resting quietly no problems. No family at bedside. I see her daughter Delbert Phenix in the hallway. Britta Mccreedy shares that she and her brother have talked in the family has decided home with hospice of [facility]. She shares that her preferences to get her mother settled, and have hospice, and evaluate her Monday of the following week. I share that this is certainly reasonable, and hospice should accommodate them in any way. To further questions of or concerns at this time, I encourage Britta Mccreedy to call with any issues.  Length of Stay: 5  Current Medications: Scheduled Meds:  . aspirin EC  81 mg Oral Daily  . brimonidine  1 drop Both Eyes Daily  . brinzolamide  1 drop Right Eye BID  . diclofenac sodium  4 g Topical TID  . diltiazem  240 mg Oral Daily  . enoxaparin (LOVENOX) injection  30 mg Subcutaneous Q24H  . famotidine  20 mg Oral BID  . feeding supplement (ENSURE ENLIVE)  237 mL Oral TID BM  . furosemide  10 mg Oral Daily  . ipratropium-albuterol  3 mL Nebulization BID  . latanoprost  1 drop Right Eye QHS  . levothyroxine  75 mcg Oral QAC breakfast  . loratadine  10 mg Oral Daily  . mometasone-formoterol  2 puff Inhalation BID  . predniSONE  10 mg Oral Q breakfast  . senna-docusate  1 tablet Oral BID  . sodium chloride flush  3 mL Intravenous Q12H  . trimethoprim  100 mg Oral Daily     Continuous Infusions: . sodium chloride 50 mL/hr at 11/10/15 1700    PRN Meds: acetaminophen **OR** acetaminophen, albuterol, LORazepam, ondansetron **OR** ondansetron (ZOFRAN) IV  Physical Exam  Constitutional: No distress.  HENT:  Head: Normocephalic.  Pulmonary/Chest: Effort normal. No respiratory distress.  Nursing note and vitals reviewed.           Vital Signs: BP 136/90 (BP Location: Left Arm)   Pulse 64   Temp 97.8 F (36.6  C) (Oral)   Resp (!) 22   Ht  (1.6 m)   Wt 89.8 kg (197 lb 14.4 oz)   SpO2 93%   BMI 35.06 kg/m  SpO2: SpO2: 93 % O2 Device: O2 Device: Nasal Cannula O2 Flow Rate: O2 Flow Rate (L/min): 3 L/min (only 1 prong in nostril)  Intake/output summary:  Intake/Output Summary (Last 24 hours) at 11/11/15 1221 Last data filed at 11/11/15 0827  Gross per 24 hour  Intake             1280 ml  Output             1300 ml  Net              -20 ml   LBM: Last BM Date: 11/10/15 Baseline Weight: Weight: 88.9 kg (196 lb) Most recent weight: Weight: 89.8 kg (197 lb 14.4 oz)       Palliative Assessment/Data:    Flowsheet Rows   Flowsheet Row Most Recent Value  Intake Tab  Referral Department  Hospitalist  Unit at Time of Referral  Med/Surg Unit  Palliative Care Primary Diagnosis  Cardiac  Date Notified  11/08/15  Palliative Care Type  New Palliative care  Reason for referral  Clarify Goals of Care  Date of Admission  11/06/15  Date first seen by Palliative Care  11/10/15  # of days Palliative referral response time  2 Day(s)  # of days IP prior to Palliative referral  2  Clinical Assessment  Palliative Performance Scale Score  30%  Pain Max last 24 hours  Not able to report  Pain Min Last 24 hours  Not able to report  Dyspnea Max Last 24 Hours  Not able to report  Dyspnea Min Last 24 hours  Not able to report  Psychosocial & Spiritual Assessment  Palliative Care Outcomes  Patient/Family meeting held?  Yes  Who was at the meeting?  son  and daughter, Allayah, [place]  Palliative Care Outcomes  Clarified goals of care, Provided psychosocial or spiritual support  Patient/Family wishes: Interventions discontinued/not started   Mechanical Ventilation  Palliative Care follow-up planned  -- [follow up while at [facility]]      Patient Active Problem List   Diagnosis Date Noted  . Palliative care encounter   . Goals of care, counseling/discussion   . Chronic respiratory failure with hypoxia (HCC) 11/07/2015  . Osteoarthritis of right knee 11/07/2015  . PAF (paroxysmal atrial fibrillation) (HCC) 11/07/2015  . CHF, acute on chronic (HCC) 11/06/2015  . Acute renal failure superimposed on stage 3 chronic kidney disease (HCC) 11/06/2015  . Edema of right lower extremity 11/06/2015  . Hypokalemia 11/06/2015  . Severe left ventricular systolic dysfunction   . Protein-calorie malnutrition, severe (HCC) 06/04/2014  . Acute respiratory failure (HCC) 05/31/2014  . Hypoxia 05/29/2014  . Positive D dimer 05/29/2014  . Atrial fibrillation with RVR (HCC) 05/29/2014  . Cardiomyopathy (HCC) 05/29/2014  . Acute exacerbation of CHF (congestive heart failure) (HCC) 05/29/2014  . COPD exacerbation (HCC) 05/29/2014  . Thoracic aorta atherosclerosis (HCC) 05/27/2014  . CAP (community acquired pneumonia) 05/27/2014  . Angiotensin converting enzyme inhibitor-aggravated angioedema 12/25/2013  . Hyponatremia 12/25/2013  . Asthma, chronic 04/09/2013  . History of gout 04/09/2013  . Hypertension 10/31/2012  . Hypothyroid 10/31/2012  . CKD (chronic kidney disease), stage III 10/31/2012  . Vitamin D deficiency 10/31/2012  . History of asthma 10/31/2012    Palliative Care Assessment & Plan   Patient  Profile: 82 y.o. female  with past medical history of CKD stage 3, asthma, HTN, CHF, COPD, A fib,  admitted on 11/06/2015 with CHF exacerbation c BNP 1100's. .   Assessment: As above   Recommendations/Plan:  Home with [facility]  Goals of Care and Additional Recommendations:  Limitations on Scope of Treatment: Avoid Hospitalization, Minimize Medications and No IV Antibiotics  Code Status:    Code Status Orders        Start     Ordered   11/06/15 2325  Do not attempt resuscitation (DNR)  Continuous    Question Answer Comment  In the event of cardiac or respiratory ARREST Do not call a "code blue"   In the event of cardiac or respiratory ARREST Do not perform Intubation, CPR, defibrillation or ACLS   In the event of cardiac or respiratory ARREST Use medication by any route, position, wound care, and other measures to relive pain and suffering. May use oxygen, suction and manual treatment of airway obstruction as needed for comfort.      11/06/15 2324    Code Status History    Date Active Date Inactive Code Status Order ID Comments User Context   06/06/2014 12:31 PM 05/27/2014  6:48 PM Full Code [id number]  Standley Brooking, MD Inpatient   05/29/2014  5:43 PM 06/06/2014 12:31 PM DNR [id number]  Rodolph Bong, MD Inpatient   05/27/2014  3:33 PM 05/29/2014  5:43 PM Full Code [id number]  Kathlen Mody, MD Inpatient    Advance Directive Documentation   Flowsheet Row Most Recent Value  Type of Advance Directive  Healthcare Power of Attorney, Living will  Pre-existing out of facility DNR order (yellow form or pink MOST form)  No data  "MOST" Form in Place?  No data       Prognosis:   < 3 months, likely based on chronic illness burden, failure to thrive.   Discharge Planning:  Home with [facility] plan was discussed with nuyrsing staff, CM, SW, and Dr. Sherrie Mustache.   Thank you for allowing the Palliative Medicine Team to assist in the care of this patient.   Time In: 1040 Time Out: 1055 Total Time 15 minutes Prolonged Time Billed  no       Greater than 50%  of this time was spent counseling and coordinating care related to the above assessment and plan.  Renard Caperton  A, NP  Please contact Palliative Medicine Team phone at [phone] for questions and concerns.

## 2015-11-11 NOTE — Discharge Summary (Signed)
Physician Discharge Summary  Daisy Scott VFI:[id number] DOB: Jul 02, 1918 DOA: 11/06/2015  PCP: Redge Gainer, MD  Admit date: 11/06/2015 Discharge date: 11/11/2015  Time spent: greater than 30 minutes  Recommendations for Outpatient Follow-up:  1. Patient was discharged with hospice services.   Discharge Diagnoses:    Hyponatremia   Generalized weakness  with progressive senile decline/FTT   Hypertension   Hypothyroid   Asthma, chronic   Severe left ventricular systolic dysfunction   Acute renal failure superimposed on stage 3 chronic kidney disease (HCC)   Edema of right lower extremity secondary to DJD of the knee   Hypokalemia   Chronic respiratory failure with hypoxia (HCC)   Osteoarthritis of right knee   PAF (paroxysmal atrial fibrillation) ([place])    Discharge Condition: Stable, but chronically ill  Diet recommendation: As tolerated  Filed Weights   11/06/15 1622 11/06/15 2213  Weight: 88.9 kg (196 lb) 89.8 kg (197 lb 14.4 oz)    History of present illness:  Patient is a 82 year old woman with a history of chronic systolic (EF 41-66%) CHF, stage III CKD, PAF on aspirin, HTN, and chronic hypoxic respiratory failure on home oxygen due to asthma/COPD, who presented to the ED on 11/06/2015 for generalized weakness. The patient had generalized weakness for 2 weeks. She had had intermittent nausea and vomiting. She was treated with an antibiotic for UTI 3 weeks ago. She had swelling in her legs right greater than left. She saw her PCP, Dr. Evette Doffing who was concerned about a spot on her right foot. Apparently blood work was done and the patient was instructed to come to the ED for further evaluation of abnormal lab results. In the ED, she was afebrile and intermittently bradycardic and mildly hypertensive. Her lab data were significant for sodium of 127, potassium of 2.9, creatinine is 2.03, BNP of 1178, troponin I of 0.06, WBC of 13.1, and unremarkable urinalysis. CT of the  head revealed no acute findings. Right foot x-ray revealed diffuse soft tissue swelling but no fractures. R LE venous ultrasound revealed no DVT. Chest x-ray revealed no active disease. She was admitted for further evaluation and management.  Hospital Course:  Right lower extremity edema with osteoarthritis of the right knee and small effusion. Evaluation in the ED yielded soft tissue swelling on the x-ray of her right foot and no DVT on the venous Doppler study. On exam, she has a mild effusion and tenderness of her right knee which was likely  the culprit for her edema. There did not appear to be cellulitis. -Treatment was started with Solu-Medrol 1. Prednisone was started and tapered.Voltaren gel has been ordered 3 times a day to the right knee. This regimen reduced the edema and tenderness of her knee.  Hyponatremia, likely from hypovolemia. Patient's serum sodium was 127 on admission. She had been eating and drinking very little. She did not appear to be volume overloaded on exam with exception of right lower extremity swelling. She appeared to be volume depleted, but apparently received 40 mg of Lasix in the ED for edema in the right leg. Gentle IV fluids were started with normal saline. Her serum sodium improved.  Hypokalemia. Patient's serum potassium was 2.9 on admission. She is on potassium chloride chronically due to chronic Lasix therapy. She was given Lasix in the ED for edema in her right leg and an elevated BNP. It worsened her serum potassium. More potassium was given and her serum potassium improved.  Acute kidney injury superimposed on stage III-IVchronic kidney  disease. Patient's creatinine was 2.03 on admission. Her creatinine 10/2014 was 1.9.  -She was started on gentle IV fluids. Scheduled Lasix was held initially. Her creatinine improved to 1.47. -Etiology was likely prerenal azotemia.  Chronic systolic congestive heart failure. Patient's EF per echo 05/2014 revealed an  EF of 20-25%. She is treated chronically with Lasix. Apparently, she has an allergy to ACE inhibitors. ARB also was not given due to her chronic kidney disease. She was not on a beta blocker, probably due to her age.. Given her DO NOT RESUSCITATE status and being 82 years of age, it was not prudent to start one. -She did not appear to be decompensated. IV Lasix was given in the ED, but scheduled dosing was initially held due to volume depletion and hypokalemia. --Lasix was restarted at a lower dose when she had some chest congestion, but the CXR revealed no edema.  Paroxysmal atrial fibrillation. The patient is treated with aspirin and diltiazem for rate control. She is not on an anticoagulant. Her rate was controlled.  Minimal elevation of troponin I. This was not consistent with ACS; possibly a consequence of AKI.  Chronic respiratory failure with hypoxia presumed to be secondary to chronic asthma or COPD. The patient was restarted on 3-4 L of nasal cannula oxygen which is her usual. Symbicort was restarted. DuoNeb nebs were given 3 times a day. There were no wheezes on exam.  Hypothyroidism. The patient was restarted on Synthroid. TSH was within normal limits.  Chronic UTIs. The patient was restarted on chronic trimethoprim treatment. Her urinalysis on admission was unremarkable.  Generalized weakness. Consideration for hospice and palliative care. Her weakness was likely multifactorial due to all the above. Vitamin B12 level was assessed and it was actually elevated. Her intake had been poor; she denied pain with swallowing or difficulty swallowing. -Patient appeared to be evolving into progressive senile decline; coupled with her chronic comorbid conditions and obesity. I discussed palliative care consult with her son who was in agreement.  -Palliative care NP, Ms. Hulan Fray met with the family. DNR status had already been established. Goals of care and poor prognosis were discussed. The  family was interested in in-home hospice. Therefore, she was discharged to home with hospice services.  Procedures:  None  Consultations:  Palliative Care  Discharge Exam: Vitals:   11/10/15 2049 11/11/15 0558  BP: (!) 156/77 136/90  Pulse: 80 64  Resp: 20 (!) 22  Temp: 98 F (36.7 C) 97.8 F (36.6 C)    General: Chronically ill appearing obese elderly woman in NAD Cardiovascular: irregular irregular Respiratory:  decreased breath sounds in the base an clear anteriorly Extremities: decrease in right knee effusion  Discharge Instructions   Discharge Instructions    Diet general    Complete by:  As directed   Discharge instructions    Complete by:  As directed   Some medications have been stopped to reduce the number of pills you take. Further symptom management will be per the Hospice protocol.   Increase activity slowly    Complete by:  As directed     Current Discharge Medication List    START taking these medications   Details  diclofenac sodium (VOLTAREN) 1 % GEL Apply 4 g topically 3 (three) times daily as needed (FOR JOINT PAIN). Qty: 1 Tube, Refills: 1      CONTINUE these medications which have NOT CHANGED   Details  allopurinol (ZYLOPRIM) 100 MG tablet TAKE TWO TABLETS BY MOUTH DAILY Qty: 60  tablet, Refills: 0    aspirin EC 81 MG tablet Take 81 mg by mouth daily.    AZOPT 1 % ophthalmic suspension INSTILL ONE DROP IN RIGHT EYE TWICE DAILY AS DIRECTED Qty: 10 mL, Refills: 0    brimonidine (ALPHAGAN) 0.2 % ophthalmic solution Place 1 drop into both eyes daily.    diltiazem (CARDIZEM CD) 240 MG 24 hr capsule TAKE ONE (1) CAPSULE EACH DAY Qty: 30 capsule, Refills: 0    famotidine (PEPCID) 20 MG tablet Take 1 tablet (20 mg total) by mouth 2 (two) times daily. Qty: 30 tablet, Refills: 0    feeding supplement, ENSURE COMPLETE, (ENSURE COMPLETE) LIQD Take 237 mLs by mouth 3 (three) times daily between meals.    furosemide (LASIX) 20 MG tablet TAKE ONE  (1) TABLET EACH DAY Qty: 30 tablet, Refills: 5    ipratropium (ATROVENT) 0.02 % nebulizer solution NEBULIZE 1 VIAL EVERY 6 HOURS Qty: 250 mL, Refills: 2    levalbuterol (XOPENEX) 0.63 MG/3ML nebulizer solution NEBULIZE 1 VIAL EVERY 6 HOURS Qty: 72 mL, Refills: 0    loratadine (CLARITIN) 10 MG tablet Take 10 mg by mouth daily.    LORazepam (ATIVAN) 0.5 MG tablet TAKE ONE TABLET TWICE DAILY AS NEEDED Qty: 60 tablet, Refills: 1    sertraline (ZOLOFT) 50 MG tablet TAKE 1/2 TABLET DAILY Qty: 15 tablet, Refills: 0    SYMBICORT 160-4.5 MCG/ACT inhaler USE 2 PUFFS TWICE DAILY Qty: 10.2 g, Refills: 1    TRAVATAN Z 0.004 % SOLN ophthalmic solution PLACE 1 DROP IN RIGHT EYE AT BEDTIME Qty: 2.5 mL, Refills: 0      STOP taking these medications     Cranberry 500 MG CAPS      levothyroxine (SYNTHROID, LEVOTHROID) 75 MCG tablet      Multiple Vitamin (MULTIVITAMIN WITH MINERALS) TABS tablet      pantoprazole (PROTONIX) 40 MG tablet      potassium chloride SA (K-DUR,KLOR-CON) 20 MEQ tablet      trimethoprim (TRIMPEX) 100 MG tablet        Allergies  Allergen Reactions  . Lisinopril Other (See Comments)    Side of face dropped.  Looked like patient had had a stroke.  . Sulfa Antibiotics     Possible reaction- vomited And angio edema  . Ciprofloxacin Nausea Only  . Macrobid [Nitrofurantoin Macrocrystal] Nausea Only   Follow-up Information    Redge Gainer, MD .   Specialty:  Family Medicine Why:  FOLLOW UP AS NEEDED Contact information: [place] [address] [phone]            The results of significant diagnostics from this hospitalization (including imaging, microbiology, ancillary and laboratory) are listed below for reference.    Significant Diagnostic Studies: Ct Abdomen Pelvis Wo Contrast  Result Date: 11/02/2015 CLINICAL DATA:  Abdominal pain and generalized weakness for 3 days. EXAM: CT CHEST, ABDOMEN AND PELVIS WITHOUT CONTRAST  TECHNIQUE: Multidetector CT imaging of the chest, abdomen and pelvis was performed following the standard protocol without IV contrast. COMPARISON:  Two-view chest x-ray 10/24/2015. CT chest without contrast 02/02/2010 FINDINGS: CT CHEST FINDINGS Mediastinum/Lymph Nodes: Calcifications are present along the ascending aorta. Mild dilation has not significantly progressed. Maximal diameter is 3.8 cm on the coronal images. The heart is mildly enlarged without significant change coronary artery calcifications are present. No significant mediastinal or axillary adenopathy is present. A moderate-sized hiatal hernia is present. Calcifications are present in the trachea. There is no significant mediastinal or  axillary adenopathy. The pulmonary arteries are mildly enlarged without significant change. Lungs/Pleura: Chronic interstitial coarsening is present at the lung bases bilaterally. Ill-defined inferior airspace disease present in the lingula. There scattered ground-glass attenuation in the right middle lobe and right lower lobe. No significant pericardial effusion is present. Musculoskeletal: Inferior endplate compression fracture is present at T8. Approximately 60% loss of height is evident relative to the adjacent level. There slight retropulsion of bone along the inferior endplate which extends into the central canal. Focal kyphosis is present at this level as well. CT ABDOMEN PELVIS FINDINGS Hepatobiliary: The liver is unremarkable. There is no significant nodularity or mass lesion. The common bile duct is within normal limits. The gallbladder is unremarkable. Focal density at the fundus the gallbladder appears calcified and may be related to prior infection. This is exophytic. Pancreas: The pancreas is unremarkable. Spleen: Spleen is unremarkable. Adrenals/Urinary Tract: Multiple exophytic low-density lesions within both kidneys are compatible with simple cyst. There is no stone or obstruction. The ureters and  urinary bladder are within normal limits. Stomach/Bowel: A moderate-sized hiatal hernia is present. The stomach and duodenum are otherwise unremarkable. The small bowel is within normal limits. The distal small bowel is mostly collapsed. The appendix is not discretely visualized and may be surgically absent. The ascending and transverse colon are within normal limits. The descending colon is unremarkable. Diverticular present throughout the sigmoid colon without inflammation to suggest diverticulitis. Vascular/Lymphatic: Atherosclerotic calcifications are present within the abdominal aorta and branch vessels without aneurysm. No significant adenopathy is present. Reproductive: Hysterectomy is noted. The ovaries are visualized and within normal limits for age. Other: No significant free fluid is present. Musculoskeletal: Levoconvex curvature is present in the lumbar spine centered at L3-4. There is rightward curvature at the thoracolumbar junction. Vacuum discs are present throughout the lumbar spine. Slight retrolisthesis is present on the right at L2-3. Minimal anterolisthesis is present at L4-5. Osseous foraminal narrowing is present on the right at L2-3 and L3-4. No focal lytic or blastic lesions are present. IMPRESSION: 1. Ill-defined airspace disease in the lingula likely reflects atelectasis. Early infection is not excluded. 2. Atherosclerosis of the thoracic aorta without aneurysm. 3. Inferior endplate compression fracture at T8 without evidence for infection. 4. Moderate-sized hiatal hernia 5. Coronary artery disease. 6. Mild cardiomegaly without failure. 7. Sigmoid diverticulosis without diverticulitis. 8. Hysterectomy. 9. Multilevel spondylosis in the lumbar spine. Electronically Signed   By: San Morelle M.D.   On: 11/02/2015 14:18   Dg Chest 2 View  Result Date: 11/06/2015 CLINICAL DATA:  Generalized weakness. Right leg swelling. Congestive heart failure. Atrial fibrillation. COPD. EXAM:  CHEST  2 VIEW COMPARISON:  10/24/2015 FINDINGS: Cardiomegaly remains stable. Ectasia and atherosclerotic calcification of thoracic aorta appears unchanged. Bibasilar pulmonary parenchymal scarring again noted kappa however there is no evidence of pulmonary edema or consolidation. No evidence of pneumothorax or pleural effusion. Pulmonary hyperinflation again noted. IMPRESSION: Stable cardiomegaly and bilateral pulmonary scarring. No active lung disease. Aortic atherosclerosis noted. Electronically Signed   By: Earle Gell M.D.   On: 11/06/2015 18:25   Dg Chest 2 View  Result Date: 10/24/2015 CLINICAL DATA:  Cough and chest congestion. EXAM: CHEST  2 VIEW COMPARISON:  09/09/2014 FINDINGS: The heart is enlarged. There is a hiatal hernia. There is aortic atherosclerosis. There is chronic pulmonary scarring at the lung bases. No sign of active infiltrate, mass, effusion or collapse. No acute bone finding. IMPRESSION: Chronic cardiomegaly.  Chronic aortic atherosclerosis. Chronic lung disease with pulmonary scarring  in the lower lungs. Electronically Signed   By: Nelson Chimes M.D.   On: 10/24/2015 15:47   Dg Ankle Complete Right  Result Date: 11/06/2015 CLINICAL DATA:  Right ankle swelling and contusion. EXAM: RIGHT ANKLE - COMPLETE 3+ VIEW COMPARISON:  None. FINDINGS: There is no evidence of fracture, dislocation, or joint effusion. Generalized osteopenia noted, but no focal bone abnormality identified. Diffuse soft tissue swelling noted. Extensive peripheral vascular calcification also seen. IMPRESSION: Diffuse soft tissue swelling.  No evidence of fracture . Electronically Signed   By: Earle Gell M.D.   On: 11/06/2015 18:28   Ct Head Wo Contrast  Result Date: 11/06/2015 CLINICAL DATA:  Slurred speech and weakness for 4 days. EXAM: CT HEAD WITHOUT CONTRAST TECHNIQUE: Contiguous axial images were obtained from the base of the skull through the vertex without intravenous contrast. COMPARISON:  04/02/2014  FINDINGS: There is no evidence of intracranial hemorrhage, brain edema, or other signs of acute infarction. There is no evidence of intracranial mass lesion or mass effect. No abnormal extraaxial fluid collections are identified. Mild diffuse cerebral and cerebellar atrophy and chronic small vessel disease are stable in appearance. No evidence of hydrocephalus. No skull abnormality identified. IMPRESSION: No acute intracranial abnormality. Stable cerebral and cerebellar atrophy, and chronic small vessel disease. Electronically Signed   By: Earle Gell M.D.   On: 11/06/2015 19:25   Ct Chest Wo Contrast  Result Date: 11/02/2015 CLINICAL DATA:  Abdominal pain and generalized weakness for 3 days. EXAM: CT CHEST, ABDOMEN AND PELVIS WITHOUT CONTRAST TECHNIQUE: Multidetector CT imaging of the chest, abdomen and pelvis was performed following the standard protocol without IV contrast. COMPARISON:  Two-view chest x-ray 10/24/2015. CT chest without contrast 02/02/2010 FINDINGS: CT CHEST FINDINGS Mediastinum/Lymph Nodes: Calcifications are present along the ascending aorta. Mild dilation has not significantly progressed. Maximal diameter is 3.8 cm on the coronal images. The heart is mildly enlarged without significant change coronary artery calcifications are present. No significant mediastinal or axillary adenopathy is present. A moderate-sized hiatal hernia is present. Calcifications are present in the trachea. There is no significant mediastinal or axillary adenopathy. The pulmonary arteries are mildly enlarged without significant change. Lungs/Pleura: Chronic interstitial coarsening is present at the lung bases bilaterally. Ill-defined inferior airspace disease present in the lingula. There scattered ground-glass attenuation in the right middle lobe and right lower lobe. No significant pericardial effusion is present. Musculoskeletal: Inferior endplate compression fracture is present at T8. Approximately 60% loss of  height is evident relative to the adjacent level. There slight retropulsion of bone along the inferior endplate which extends into the central canal. Focal kyphosis is present at this level as well. CT ABDOMEN PELVIS FINDINGS Hepatobiliary: The liver is unremarkable. There is no significant nodularity or mass lesion. The common bile duct is within normal limits. The gallbladder is unremarkable. Focal density at the fundus the gallbladder appears calcified and may be related to prior infection. This is exophytic. Pancreas: The pancreas is unremarkable. Spleen: Spleen is unremarkable. Adrenals/Urinary Tract: Multiple exophytic low-density lesions within both kidneys are compatible with simple cyst. There is no stone or obstruction. The ureters and urinary bladder are within normal limits. Stomach/Bowel: A moderate-sized hiatal hernia is present. The stomach and duodenum are otherwise unremarkable. The small bowel is within normal limits. The distal small bowel is mostly collapsed. The appendix is not discretely visualized and may be surgically absent. The ascending and transverse colon are within normal limits. The descending colon is unremarkable. Diverticular present throughout the sigmoid  colon without inflammation to suggest diverticulitis. Vascular/Lymphatic: Atherosclerotic calcifications are present within the abdominal aorta and branch vessels without aneurysm. No significant adenopathy is present. Reproductive: Hysterectomy is noted. The ovaries are visualized and within normal limits for age. Other: No significant free fluid is present. Musculoskeletal: Levoconvex curvature is present in the lumbar spine centered at L3-4. There is rightward curvature at the thoracolumbar junction. Vacuum discs are present throughout the lumbar spine. Slight retrolisthesis is present on the right at L2-3. Minimal anterolisthesis is present at L4-5. Osseous foraminal narrowing is present on the right at L2-3 and L3-4. No focal  lytic or blastic lesions are present. IMPRESSION: 1. Ill-defined airspace disease in the lingula likely reflects atelectasis. Early infection is not excluded. 2. Atherosclerosis of the thoracic aorta without aneurysm. 3. Inferior endplate compression fracture at T8 without evidence for infection. 4. Moderate-sized hiatal hernia 5. Coronary artery disease. 6. Mild cardiomegaly without failure. 7. Sigmoid diverticulosis without diverticulitis. 8. Hysterectomy. 9. Multilevel spondylosis in the lumbar spine. Electronically Signed   By: San Morelle M.D.   On: 11/02/2015 14:18   US Venous Img Lower Unilateral Right  Result Date: 11/06/2015 CLINICAL DATA:  Swelling EXAM: RIGHT LOWER EXTREMITY VENOUS DOPPLER ULTRASOUND TECHNIQUE: Gray-scale sonography with graded compression, as well as color Doppler and duplex ultrasound were performed to evaluate the lower extremity deep venous systems from the level of the common femoral vein and including the common femoral, femoral, profunda femoral, popliteal and calf veins including the posterior tibial, peroneal and gastrocnemius veins when visible. The superficial great saphenous vein was also interrogated. Spectral Doppler was utilized to evaluate flow at rest and with distal augmentation maneuvers in the common femoral, femoral and popliteal veins. COMPARISON:  None. FINDINGS: Contralateral Common Femoral Vein: Respiratory phasicity is normal and symmetric with the symptomatic side. No evidence of thrombus. Normal compressibility. Common Femoral Vein: No evidence of thrombus. Normal compressibility, respiratory phasicity and response to augmentation. Saphenofemoral Junction: No evidence of thrombus. Normal compressibility and flow on color Doppler imaging. Profunda Femoral Vein: No evidence of thrombus. Normal compressibility and flow on color Doppler imaging. Femoral Vein: No evidence of thrombus. Normal compressibility, respiratory phasicity and response to  augmentation. Popliteal Vein: No evidence of thrombus. Normal compressibility, respiratory phasicity and response to augmentation. Calf Veins: Poor visualization of the calf veins and peroneal vein. Superficial Great Saphenous Vein: Poor visualization of the greater saphenous vein. Venous Reflux:  None. Other Findings:  None. IMPRESSION: Evaluation of the calf veins and greater saphenous veins is limited. No DVT seen from the right common femoral vein through the popliteal vein. Electronically Signed   By: Dorise Bullion III M.D   On: 11/06/2015 18:48   Dg Chest Port 1 View  Result Date: 11/08/2015 CLINICAL DATA:  Shortness of breath. EXAM: PORTABLE CHEST 1 VIEW COMPARISON:  Chest x-rays from October 24, 2015 and November 06, 2015 FINDINGS: No pneumothorax. The cardiomediastinal silhouette is stable with cardiomegaly and prominent bilateral pulmonary arteries. Mild increased opacity in left lung base may represent atelectasis versus early infiltrate. Recommend attention on follow-up. No other acute abnormalities. IMPRESSION: 1. Increasing opacity in left lung base may represent atelectasis versus early infiltrate. Recommend clinical correlation and follow-up to resolution. Electronically Signed   By: Dorise Bullion III M.D   On: 11/08/2015 16:34   Dg Foot Complete Right  Result Date: 11/06/2015 CLINICAL DATA:  Right foot swelling and contusion. EXAM: RIGHT FOOT COMPLETE - 3+ VIEW COMPARISON:  None. FINDINGS: There is no evidence of  acute fracture or dislocation. Severe hallux valgus with bony bunion formation noted. Generalized osteopenia. No focal lytic or sclerotic bone lesions. Small calcaneal bone spurs noted. Peripheral vascular calcification also seen. IMPRESSION: No acute findings. Severe hallux valgus and bony bunion. Electronically Signed   By: Earle Gell M.D.   On: 11/06/2015 18:30    Microbiology: Recent Results (from the past 240 hour(s))  Urine culture     Status: None   Collection Time:  11/06/15  7:00 PM  Result Value Ref Range Status   Specimen Description URINE, CATHETERIZED  Final   Special Requests NONE  Final   Culture NO GROWTH Performed at [facility]   Final   Report Status 11/08/2015 FINAL  Final     Labs: Basic Metabolic Panel:  Recent Labs Lab 11/06/15 2340 11/07/15 1042 11/08/15 0614 11/09/15 0755 11/10/15 0536  NA 128* 131* 131* 132* 135  K 3.0* 2.7* 4.1 4.0 4.7  CL 86* 88* 91* 94* 98*  CO2 '29 29 27 29 27  ' GLUCOSE 104* 102* 151* 102* 100*  BUN 33* 31* 40* 45* 42*  CREATININE 2.03* 1.90* 2.09* 1.90* 1.47*  CALCIUM 8.6* 8.9 8.6* 8.5* 8.9  MG  --  1.7  --   --   --    Liver Function Tests:  Recent Labs Lab 11/06/15 1731  AST 33  ALT 35  ALKPHOS 72  BILITOT 0.6  PROT 6.8  ALBUMIN 3.5   No results for input(s): LIPASE, AMYLASE in the last 168 hours. No results for input(s): AMMONIA in the last 168 hours. CBC:  Recent Labs Lab 11/05/15 1814 11/06/15 1731 11/07/15 0448 11/08/15 0614  WBC 11.6* 13.1* 14.2* 8.2  NEUTROABS 8.3* 9.8*  --   --   HGB  --  12.6 13.3 12.8  HCT 38.6 37.8 40.6 38.5  MCV 92 93.8 94.0 93.9  PLT 321 250 282 264   Cardiac Enzymes:  Recent Labs Lab 11/06/15 1731  TROPONINI 0.06*   BNP: BNP (last 3 results)  Recent Labs  10/30/15 1547 11/05/15 1814 11/06/15 1731  BNP 911.3* 1,679.9* 1,178.0*    ProBNP (last 3 results) No results for input(s): PROBNP in the last 8760 hours.  CBG: No results for input(s): GLUCAP in the last 168 hours.     Signed:  Salbador Fiveash MD.  [facility] 11/11/2015, 12:58 PM

## 2015-11-11 NOTE — Care Management Note (Signed)
Case Management Note  Patient Details  Name: Daisy Scott MRN: [id number] Date of Birth: [birth year]   SSN # [id number]  Daisy, Scott ([id number]) Sex Female DOB 1918/05/31  Room Bed  A324 A324-01  Patient Demographics   Address [address] [place] [address] Phone [phone] (Home) [phone] (Mobile) E-mail Address [email or web address]  Patient Ethnicity & Race   Ethnic Group Patient Race  Not Hispanic or Latino White or Caucasian  Emergency Contact(s)   Name Relation Home Work Mobile  Daisy Scott [phone]  [phone]  Delbert Phenix Daughter [phone]  [phone]  Documents on File    Status Date Received Description  Documents for the Patient  Historic Radiology Documentation Not Received    [facility] E-Signature HIPAA Notice of Privacy Received 11/02/15   [place] E-Signature HIPAA Notice of Privacy Spanish Received 06/27/12   [place] HIPAA NOTICE OF PRIVACY - Scanned Not Received    Driver's License Not Received    Insurance Card Not Received    Advance Directives/Living Will/HCPOA/POA Not Received    Advanced Beneficiary Notice (ABN) Not Received    Insurance Card Received 09/05/12 BLUE MCR/[facility]  Driver's License Received 09/05/12 [facility]  Release of Information Received 09/05/12 [facility]  Insurance Card Not Received    HIM ROI Authorization (Expired) 05/25/13 BCBS MCR Chart Review  AMB Correspondence  06/08/13 02/15 [facility]  AMB Correspondence  08/23/13 04/15 SUMMARY OF SERVICE [facility]  E-Signature AOB Spanish Not Received    Insurance Card   wrfm/blue medicare  HIM ROI Authorization (Expired) 12/28/13 Records requested by BCBS Medicare to [place] - [facility]  AMB Patient Logs/Info  01/17/14   Insurance Card Received 05/27/14 bcbs mcr/ wrfm  AMB HH/NH/Hospice  06/19/14 POC [facility]  AMB HH/NH/Hospice  06/18/14 ORDER [facility]  AMB Provider Completed Forms   06/21/14 PHYSICIAN'S ORDER [facility]  Other Photo ID Not Received    AMB HH/NH/Hospice  07/21/14 ORDER [facility]  HIM ROI Authorization  11/12/14 [facility] needs last urology related office notes for appointment on 11/20/14  AMB HH/NH/Hospice  06/18/14 [facility]  AMB Correspondence  11/20/14 OFFICE NOTE [facility]  HIM ROI Authorization (Expired) 12/16/14 Authorization for batch [employer]/BCBS 12/16/14 SS3  AMB Correspondence  12/12/14 OFFICE NOTE [facility]  HIM ROI Authorization (Expired) 02/21/15 Authorization for batch [employer]/Blue [employer]    02/21/2015.4  HIM ROI Authorization (Expired) 05/19/15 Authorization for batch [employer]/Blue [employer]    05/19/2015.2  AMB Correspondence  07/31/15 OXYGEN VERIFICATION [facility]  AMB Provider Completed Forms  06/20/15 CM[facility]  Insurance Card Received 10/24/15 [email or web address]  Advance Directives/Living Will/HCPOA/POA Received 11/02/15 2017  AMB HH/NH/Hospice (Deleted) 06/21/14 PHYSICIAN'S ORDER [facility]  Documents for the Encounter  AOB (Assignment of Insurance Benefits) Not Received    E-signature AOB Received 11/06/15   MEDICARE RIGHTS Not Received    E-signature Medicare Rights Received 11/06/15   Ultrasound  11/06/15   CARDIOVASCULAR  11/07/15   Cardiac Monitoring Strip  11/06/15   Cardiac Monitoring Strip (Deleted) 11/06/15   Cardiac Monitoring Strip (Deleted) 11/06/15   Cardiac Monitoring Strip (Deleted) 11/06/15   EKG  11/07/15   Cardiac Monitoring Strip (Deleted) 11/06/15   Cardiac Monitoring Strip (Deleted) 11/06/15   Cardiac Monitoring Strip (Deleted) 11/06/15   Cardiac Monitoring Strip (Deleted) 11/06/15   Cardiac Monitoring Strip (Deleted) 11/06/15   Admission Information  Attending Provider Admitting Provider Admission Type Admission Date/Time   Elliot Cousin, MD Jonah Blue, MD Emergency 11/06/15 1645  Discharge Date Hospital Service Auth/Cert Status Service Area   Internal Medicine Incomplete [place] SERVICE AREA  Unit Room/Bed Admission Status   AP-DEPT 300 A324/A324-01 Admission (Confirmed)   Admission   Complaint  weak  Hospital Account   Name Acct ID Class Status Primary Coverage  Trey, [place] [id number] Inpatient Open BLUE CROSS BLUE SHIELD MEDICARE - BCBS MEDICARE      Guarantor Account (for Hospital Account [id number])   Name Relation to Pt Service Area Active? Acct Type  Daisy Scott Yes Personal/Family  Address Phone    [address] [place], [place] [address] [phone](H)        Coverage Information (for Hospital Account [id number])   F/O Payor/Plan Precert #  [facility] SHIELD MEDICARE/BCBS MEDICARE   Subscriber Subscriber #  Daisy, Scott [id number]  Address Phone  [address] [address] [place], [place] [address]

## 2015-11-11 NOTE — Care Management Note (Signed)
Case Management Note  Patient Details  Name: Daisy Scott MRN: [id number] Date of Birth: 12/13/1918  Additional Comments: Spoke with family at bedside, who would like to use home hospice services provided by [facility].  Faxed Referral to [facility] hospice, spoke with Shon Hale who will contact family. Family expresses no need for equipment at this time, she has hospital bed and other equipment already. Patient to discharge home today, family wishes for hospice evaluation to not take place until Monday, Shon Hale with [facility] hospice aware. Will sign off.  Sharman Garrott, Chrystine Oiler, RN 11/11/2015, 1:58 PM

## 2015-11-11 NOTE — Telephone Encounter (Signed)
Detailed message left for [facility] @ Hospice.

## 2015-11-13 ENCOUNTER — Other Ambulatory Visit: Payer: Self-pay | Admitting: Family Medicine

## 2015-11-14 ENCOUNTER — Telehealth: Payer: Self-pay

## 2015-11-14 NOTE — Telephone Encounter (Signed)
Hospice aware

## 2015-11-14 NOTE — Telephone Encounter (Signed)
Okay with Ativan increase, hospice patient.   Murtis Sink, MD [facility] 11/14/2015, 12:14 PM

## 2015-11-25 ENCOUNTER — Other Ambulatory Visit: Payer: Self-pay | Admitting: *Deleted

## 2015-11-25 MED ORDER — FUROSEMIDE 20 MG PO TABS: ORAL_TABLET | ORAL | 2 refills | Status: AC

## 2015-11-26 ENCOUNTER — Other Ambulatory Visit: Payer: Self-pay | Admitting: Family Medicine

## 2015-12-05 ENCOUNTER — Other Ambulatory Visit: Payer: Self-pay | Admitting: Family Medicine

## 2015-12-18 ENCOUNTER — Other Ambulatory Visit: Payer: Self-pay | Admitting: *Deleted

## 2015-12-18 MED ORDER — LEVALBUTEROL HCL 0.63 MG/3ML IN NEBU: INHALATION_SOLUTION | RESPIRATORY_TRACT | 11 refills | Status: AC

## 2015-12-30 ENCOUNTER — Encounter: Payer: Self-pay | Admitting: Family Medicine

## 2016-01-06 ENCOUNTER — Other Ambulatory Visit: Payer: Self-pay | Admitting: Family Medicine

## 2016-01-13 ENCOUNTER — Other Ambulatory Visit: Payer: Self-pay | Admitting: Pharmacist

## 2016-02-04 ENCOUNTER — Other Ambulatory Visit: Payer: Self-pay | Admitting: Family Medicine

## 2016-03-19 DEATH — deceased

## 2016-04-04 IMAGING — CR DG CHEST 2V
2 series · 2 of 2 positions shown · non-contrast
Comparison: None.

ADDENDUM:
In the body of the report it should read no acute bony abnormality.
In the impression report issued. Small bilateral pleural effusions
appear. Similar findings were noted on prior chest x-ray of 1 /15/
3629.
CLINICAL DATA: Congestion x1 week .

EXAM:
CHEST  2 VIEW

[view not recorded (1 of 2)]
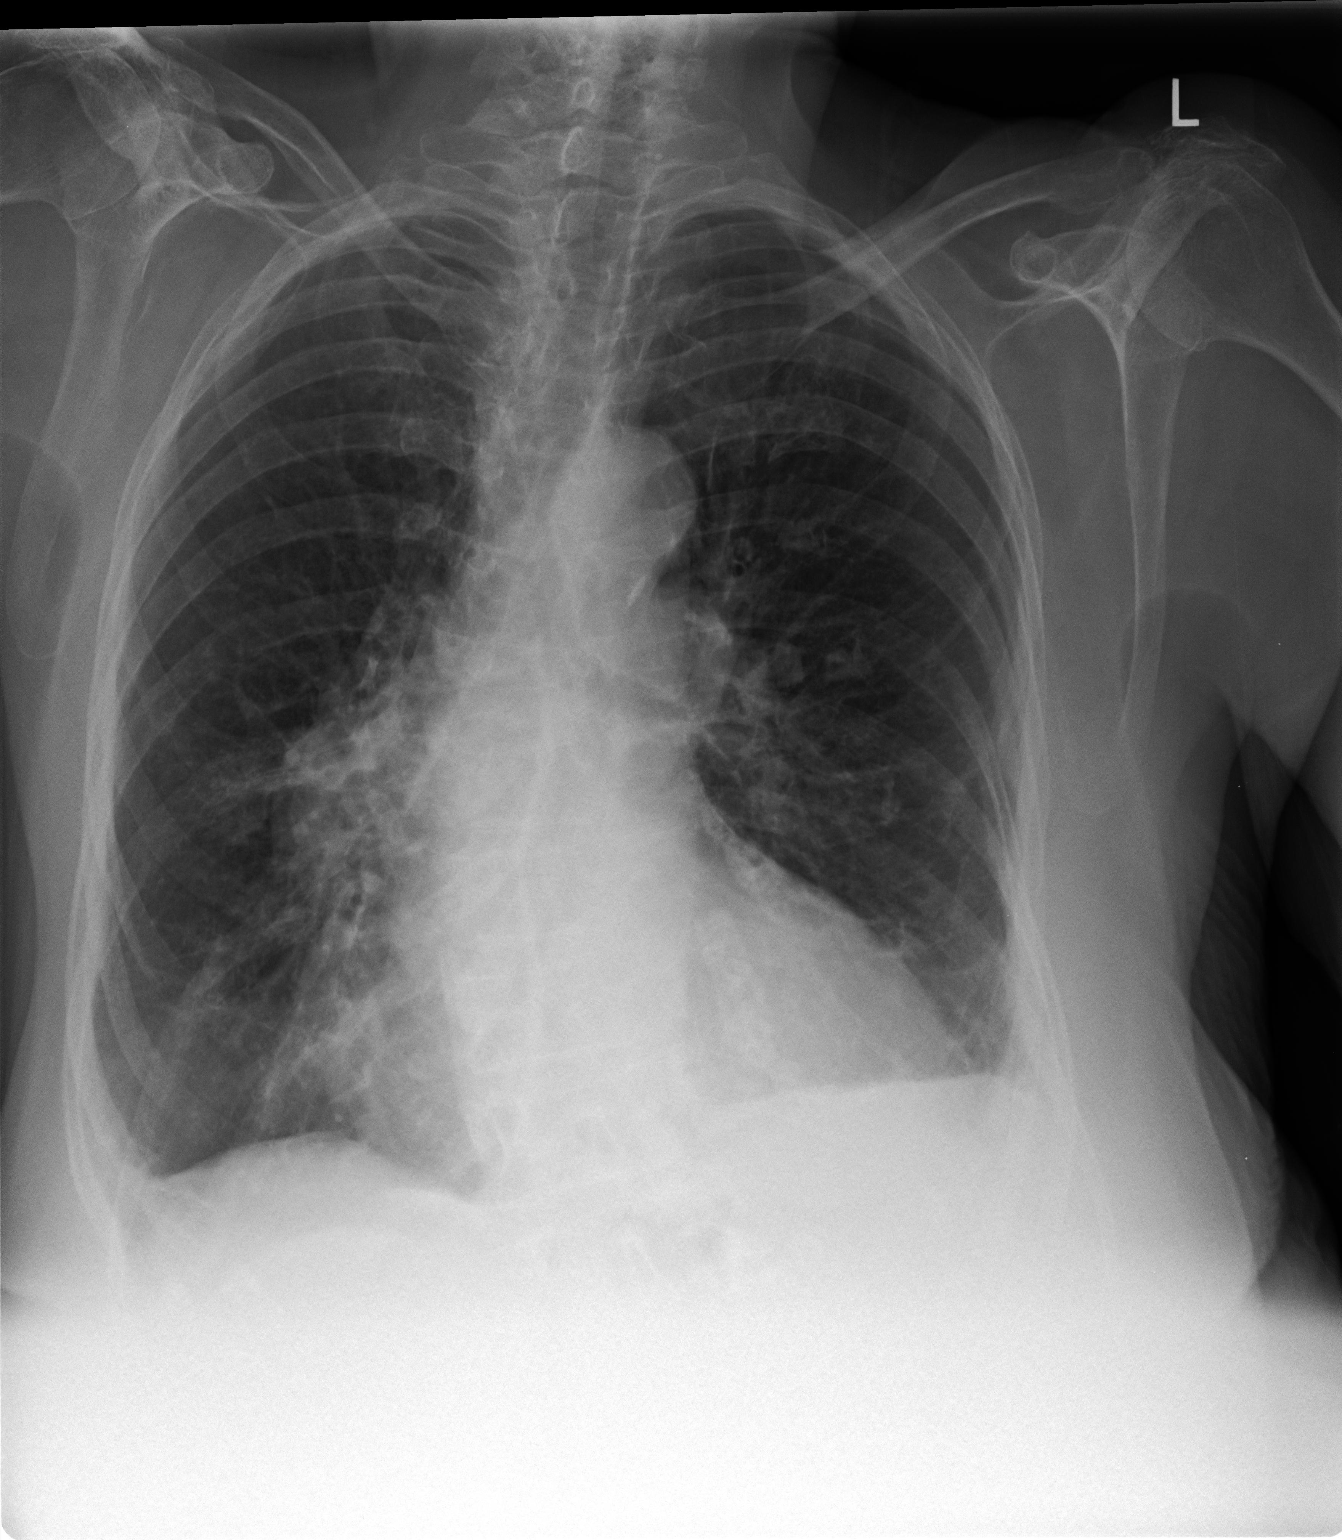

[view not recorded (2 of 2)]
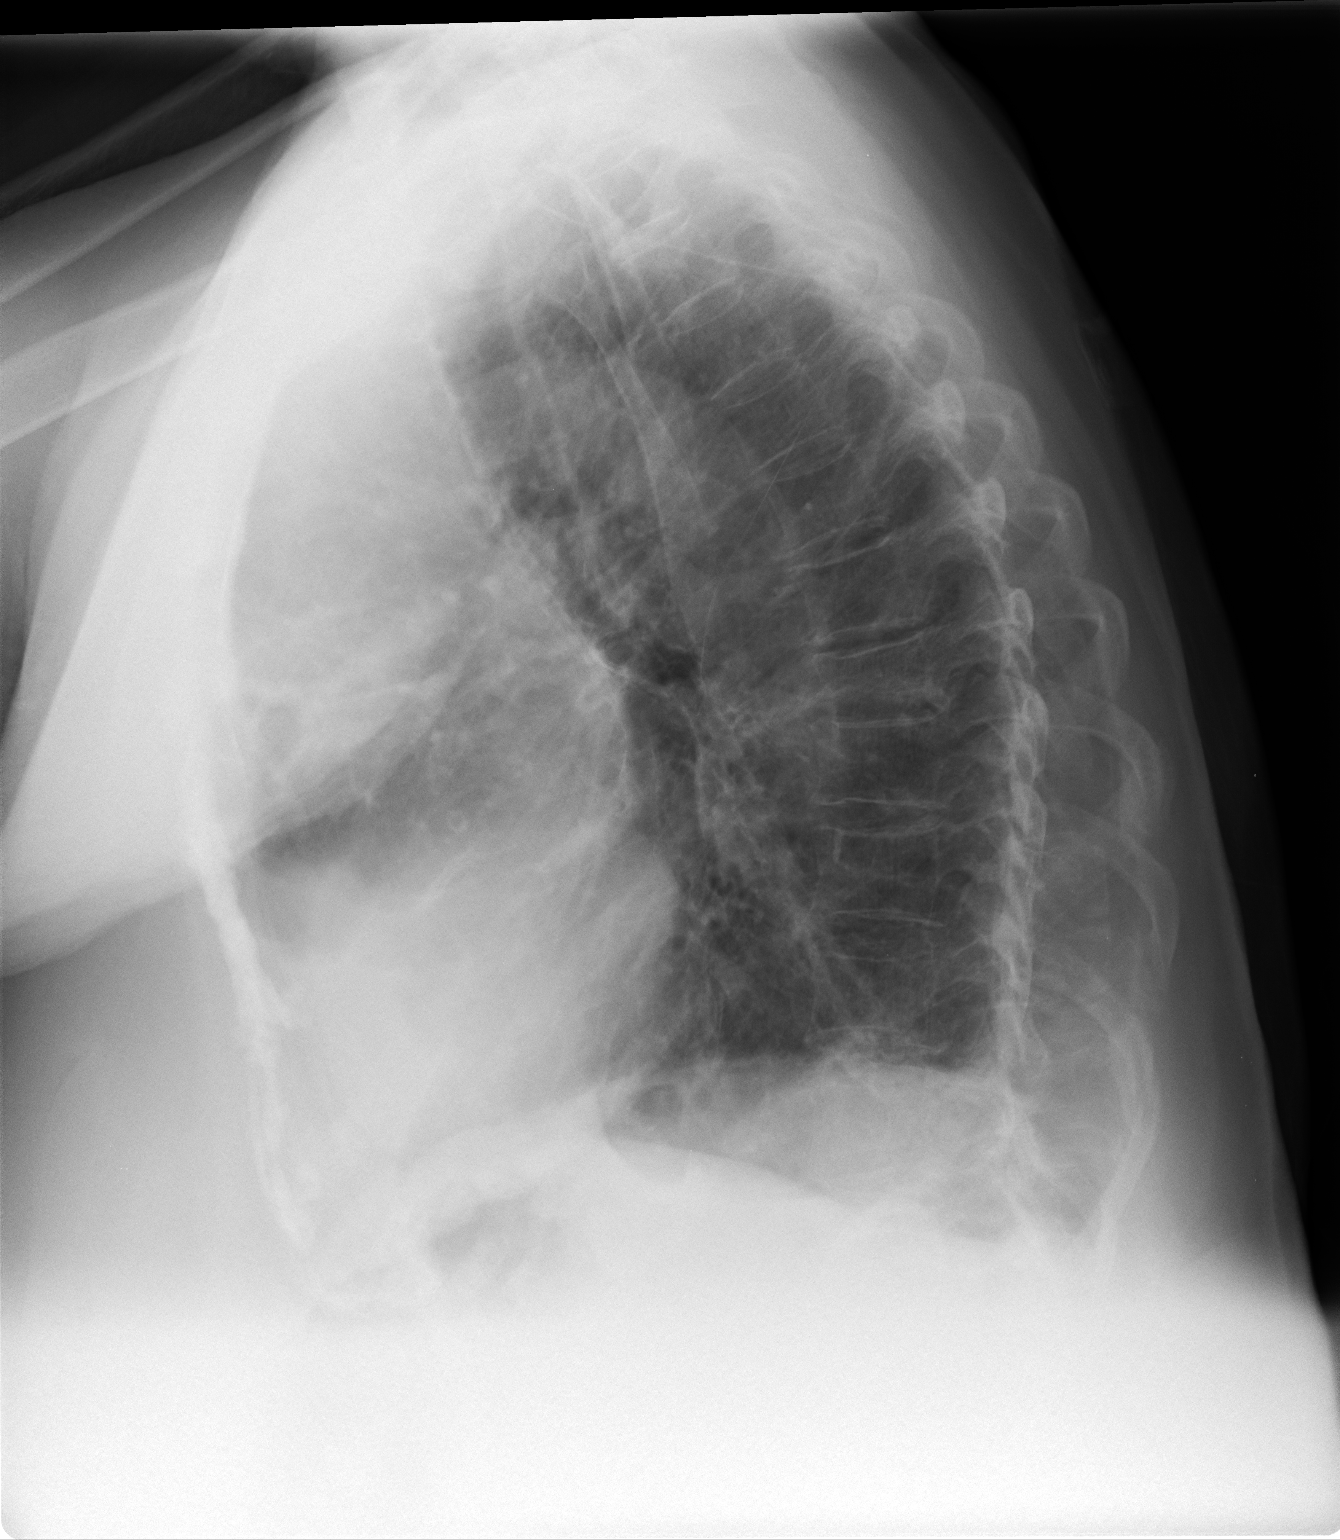

[2 of 2 positions shown; findings below may reference images not displayed]

FINDINGS: Mediastinum and hilar structures are normal. Cardiomegaly with mild
pulmonary vascular prominence. Bilateral small pleural effusions are
noted. No focal infiltrate. No acute bony abnormality P
IMPRESSION: 1. Cardiomegaly with mild pulmonary vascular prominence.
2. Small bilateral pleural effusions appear

## 2017-09-10 IMAGING — CT CT CHEST W/O CM
1 of 2 series · 13 of 32 positions shown, 17 images · non-contrast
Comparison: Two-view chest x-ray 10/24/2015. CT chest without
contrast 02/02/2010

CLINICAL DATA: Abdominal pain and generalized weakness for 3 days.

EXAM:
CT CHEST, ABDOMEN AND PELVIS WITHOUT CONTRAST
TECHNIQUE: Multidetector CT imaging of the chest, abdomen and pelvis was
performed following the standard protocol without IV contrast.

[Series 6: lung · axial · 0.75mm/px · z∈[+670,+1216]mm · 13 of 307 slices shown, 17 images]
[im 17/307  mediastinal]
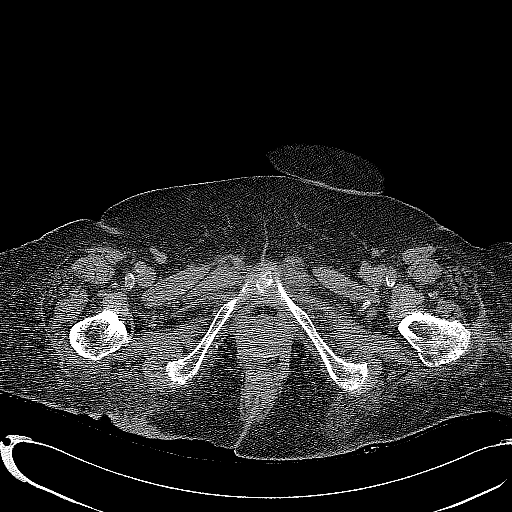
[im 17/307  lung]
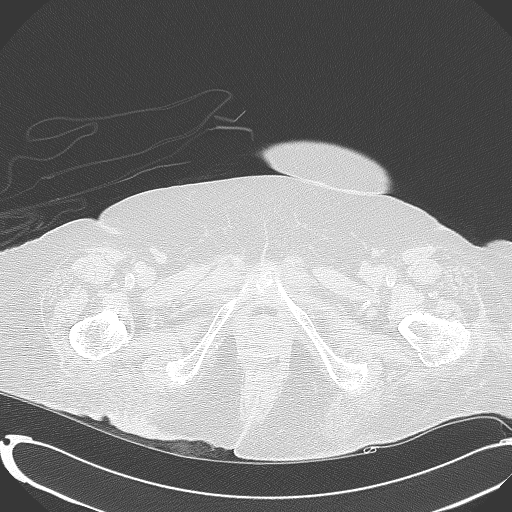
[im 49/307  lung]
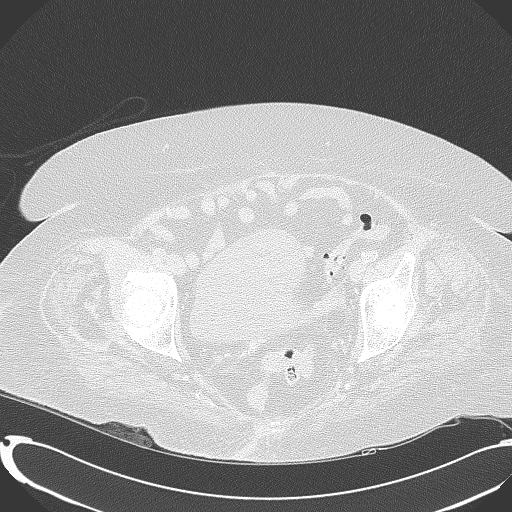
[im 81/307  lung]
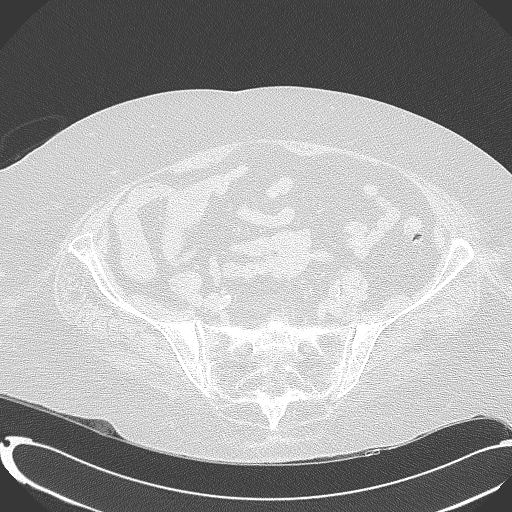
[im 97/307  lung]
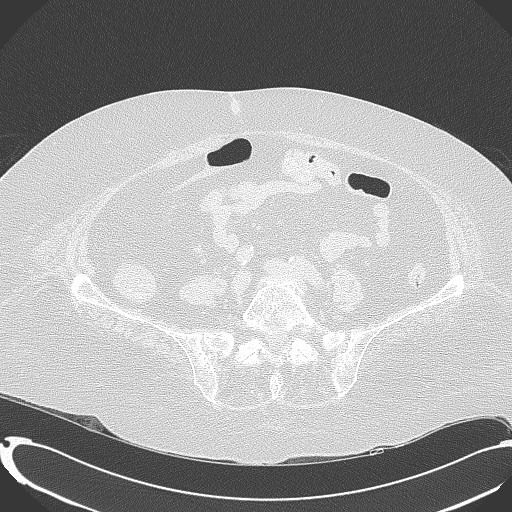
[im 113/307  mediastinal]
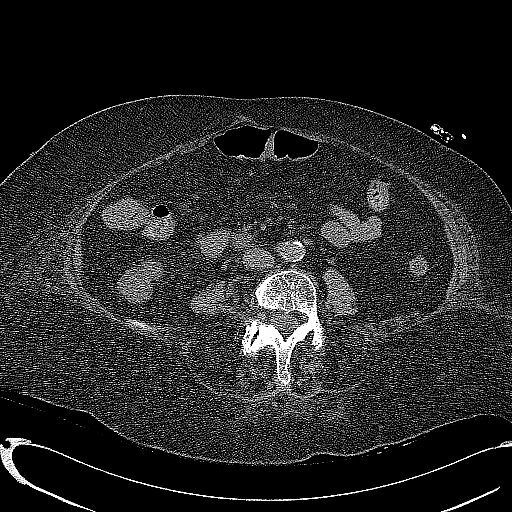
[im 113/307  lung]
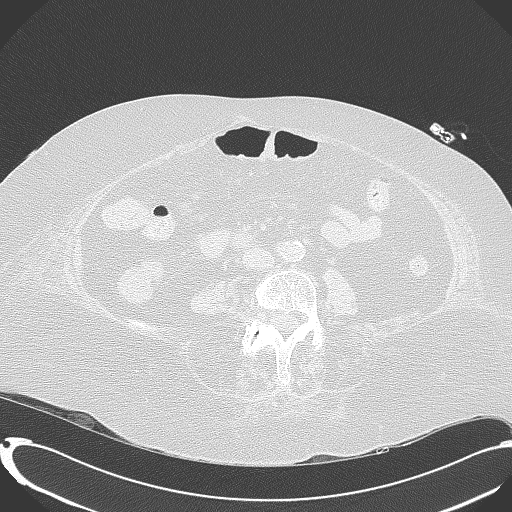
[im 129/307  lung]
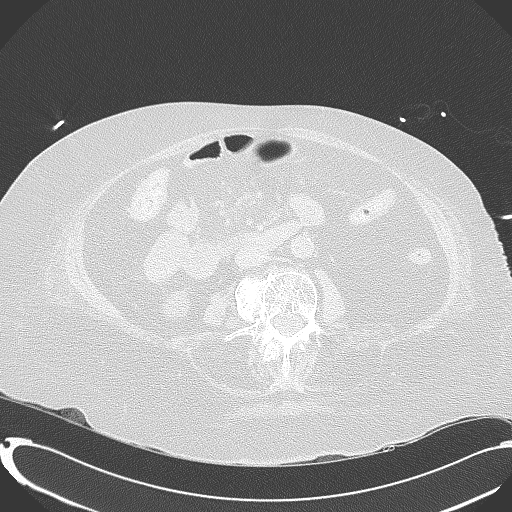
[im 149/307  lung]
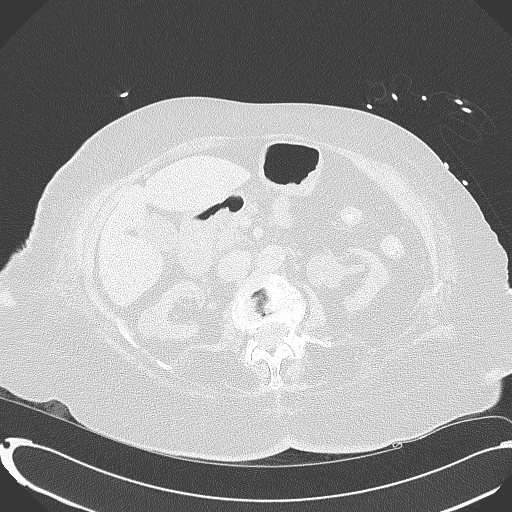
[im 178/307  lung]
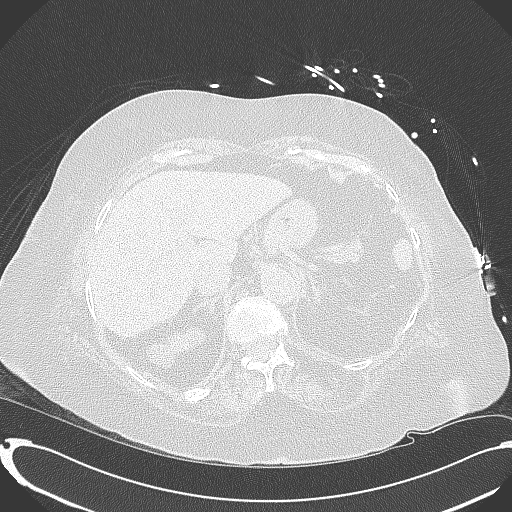
[im 194/307  mediastinal]
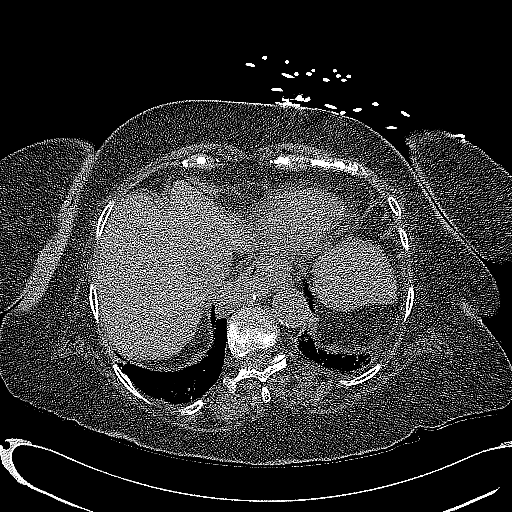
[im 194/307  lung]
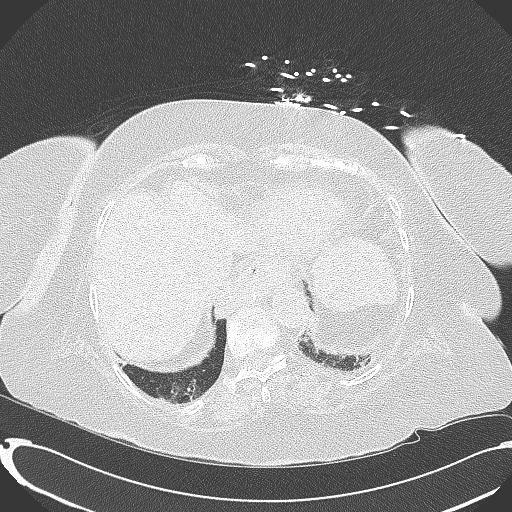
[im 210/307  lung]
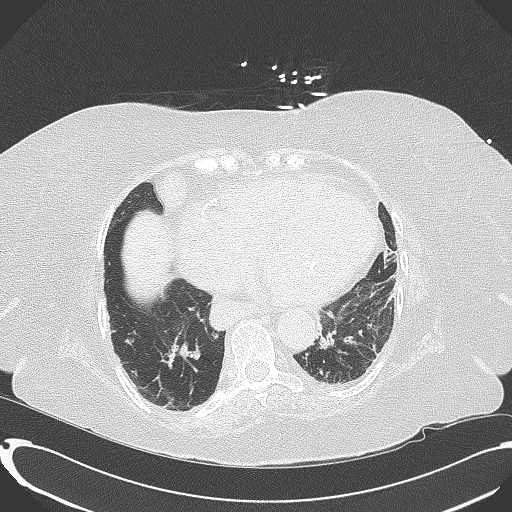
[im 226/307  lung]
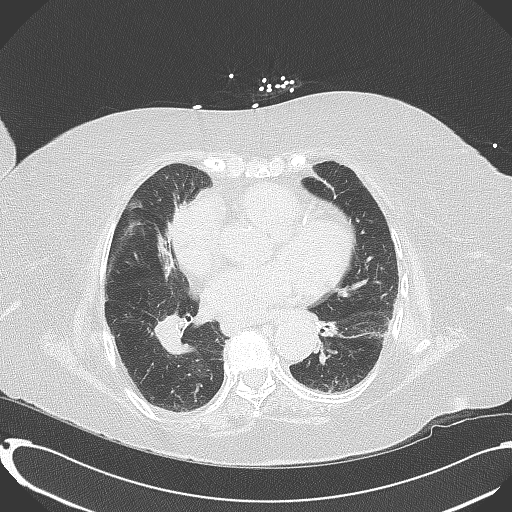
[im 258/307  lung]
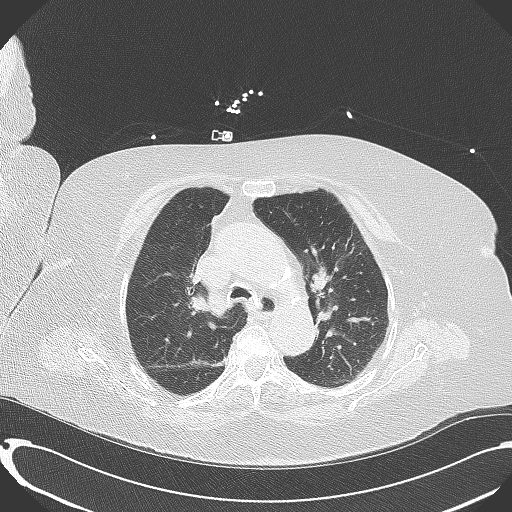
[im 290/307  mediastinal]
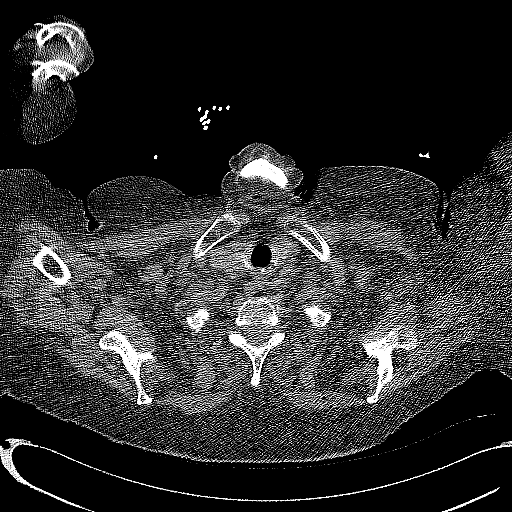
[im 290/307  lung]
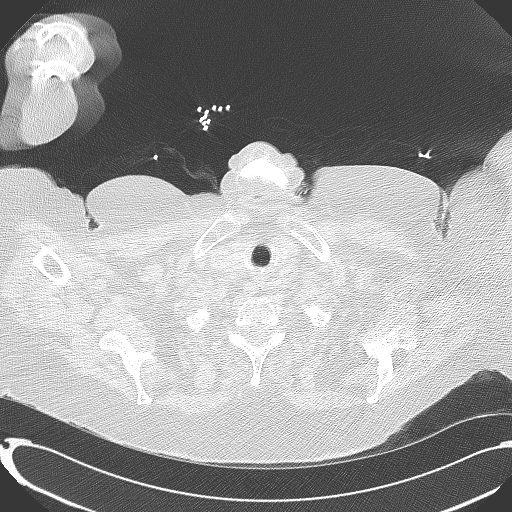

[13 of 32 positions shown; findings below may reference images not displayed]

FINDINGS: CT CHEST FINDINGS

Mediastinum/Lymph Nodes: Calcifications are present along the
ascending aorta. Mild dilation has not significantly progressed.
Maximal diameter is 3.8 cm on the coronal images. The heart is
mildly enlarged without significant change coronary artery
calcifications are present. No significant mediastinal or axillary
adenopathy is present. A moderate-sized hiatal hernia is present.
Calcifications are present in the trachea. There is no significant
mediastinal or axillary adenopathy. The pulmonary arteries are
mildly enlarged without significant change.

Lungs/Pleura: Chronic interstitial coarsening is present at the lung
bases bilaterally. Ill-defined inferior airspace disease present in
the lingula. There scattered ground-glass attenuation in the right
middle lobe and right lower lobe. No significant pericardial
effusion is present.

Musculoskeletal: Inferior endplate compression fracture is present
at T8. Approximately 60% loss of height is evident relative to the
adjacent level. There slight retropulsion of bone along the inferior
endplate which extends into the central canal. Focal kyphosis is
present at this level as well.

CT ABDOMEN PELVIS FINDINGS

Hepatobiliary: The liver is unremarkable. There is no significant
nodularity or mass lesion. The common bile duct is within normal
limits. The gallbladder is unremarkable. Focal density at the fundus
the gallbladder appears calcified and may be related to prior
infection. This is exophytic.

Pancreas: The pancreas is unremarkable.

Spleen: Spleen is unremarkable.

Adrenals/Urinary Tract: Multiple exophytic low-density lesions
within both kidneys are compatible with simple cyst. There is no
stone or obstruction. The ureters and urinary bladder are within
normal limits.

Stomach/Bowel: A moderate-sized hiatal hernia is present. The
stomach and duodenum are otherwise unremarkable. The small bowel is
within normal limits. The distal small bowel is mostly collapsed.
The appendix is not discretely visualized and may be surgically
absent. The ascending and transverse colon are within normal limits.
The descending colon is unremarkable. Diverticular present
throughout the sigmoid colon without inflammation to suggest
diverticulitis.

Vascular/Lymphatic: Atherosclerotic calcifications are present
within the abdominal aorta and branch vessels without aneurysm. No
significant adenopathy is present.

Reproductive: Hysterectomy is noted. The ovaries are visualized and
within normal limits for age.

Other: No significant free fluid is present.

Musculoskeletal: Levoconvex curvature is present in the lumbar spine
centered at L3-4. There is rightward curvature at the thoracolumbar
junction. Vacuum discs are present throughout the lumbar spine.
Slight retrolisthesis is present on the right at L2-3. Minimal
anterolisthesis is present at L4-5. Osseous foraminal narrowing is
present on the right at L2-3 and L3-4. No focal lytic or blastic
lesions are present.
IMPRESSION: 1. Ill-defined airspace disease in the lingula likely reflects
atelectasis. Early infection is not excluded.
2. Atherosclerosis of the thoracic aorta without aneurysm.
3. Inferior endplate compression fracture at T8 without evidence for
infection.
4. Moderate-sized hiatal hernia
5. Coronary artery disease.
6. Mild cardiomegaly without failure.
7. Sigmoid diverticulosis without diverticulitis.
8. Hysterectomy.
9. Multilevel spondylosis in the lumbar spine.
